# Patient Record
Sex: Male | Born: 1975
Health system: Southern US, Community
[De-identification: ages and names within clinical notes are randomized; demographics above are authoritative.]

## PROBLEM LIST (undated history)

## (undated) DIAGNOSIS — K219 Gastro-esophageal reflux disease without esophagitis: Secondary | ICD-10-CM

## (undated) DIAGNOSIS — E119 Type 2 diabetes mellitus without complications: Secondary | ICD-10-CM

## (undated) DIAGNOSIS — J302 Other seasonal allergic rhinitis: Secondary | ICD-10-CM

## (undated) HISTORY — DX: Other seasonal allergic rhinitis: J30.2

## (undated) HISTORY — DX: Gastro-esophageal reflux disease without esophagitis: K21.9

## (undated) HISTORY — PX: VASECTOMY: SHX75

---

## 2003-12-09 ENCOUNTER — Emergency Department (HOSPITAL_COMMUNITY): Admission: EM | Admit: 2003-12-09 | Discharge: 2003-12-10 | Payer: Self-pay | Admitting: Emergency Medicine

## 2009-07-15 ENCOUNTER — Telehealth: Payer: Self-pay | Admitting: Internal Medicine

## 2009-07-25 ENCOUNTER — Ambulatory Visit: Payer: Self-pay | Admitting: Internal Medicine

## 2009-07-25 DIAGNOSIS — F17200 Nicotine dependence, unspecified, uncomplicated: Secondary | ICD-10-CM | POA: Insufficient documentation

## 2009-08-01 ENCOUNTER — Ambulatory Visit: Payer: Self-pay | Admitting: Internal Medicine

## 2009-08-01 LAB — CONVERTED CEMR LAB
ALT: 25 units/L (ref 0–53)
BUN: 16 mg/dL (ref 6–23)
Basophils Relative: 0.4 % (ref 0.0–3.0)
CO2: 31 meq/L (ref 19–32)
Chloride: 105 meq/L (ref 96–112)
Cholesterol: 181 mg/dL (ref 0–200)
Eosinophils Absolute: 0.1 10*3/uL (ref 0.0–0.7)
Eosinophils Relative: 2.2 % (ref 0.0–5.0)
HCT: 45.1 % (ref 39.0–52.0)
Lymphs Abs: 2.7 10*3/uL (ref 0.7–4.0)
MCHC: 33.2 g/dL (ref 30.0–36.0)
MCV: 92.7 fL (ref 78.0–100.0)
Monocytes Absolute: 0.5 10*3/uL (ref 0.1–1.0)
Platelets: 232 10*3/uL (ref 150.0–400.0)
Potassium: 5 meq/L (ref 3.5–5.1)
Specific Gravity, Urine: 1.02 (ref 1.000–1.030)
TSH: 1.24 microintl units/mL (ref 0.35–5.50)
Total Bilirubin: 1 mg/dL (ref 0.3–1.2)
Total Protein, Urine: NEGATIVE mg/dL
Total Protein: 7.3 g/dL (ref 6.0–8.3)
Urine Glucose: NEGATIVE mg/dL
WBC: 6.6 10*3/uL (ref 4.5–10.5)

## 2009-08-05 ENCOUNTER — Telehealth: Payer: Self-pay | Admitting: Internal Medicine

## 2009-10-15 ENCOUNTER — Ambulatory Visit: Payer: Self-pay | Admitting: Internal Medicine

## 2009-10-15 DIAGNOSIS — J3089 Other allergic rhinitis: Secondary | ICD-10-CM | POA: Insufficient documentation

## 2010-03-31 ENCOUNTER — Ambulatory Visit: Payer: Self-pay | Admitting: Internal Medicine

## 2010-03-31 DIAGNOSIS — D485 Neoplasm of uncertain behavior of skin: Secondary | ICD-10-CM | POA: Insufficient documentation

## 2010-05-28 ENCOUNTER — Ambulatory Visit: Payer: Self-pay | Admitting: Internal Medicine

## 2010-07-28 NOTE — Assessment & Plan Note (Signed)
Summary: congested/plotnikov/cd   Vital Signs:  Patient profile:   35 year old male Height:      69 inches Weight:      180 pounds BMI:     26.68 O2 Sat:      97 % on Room air Temp:     97.9 degrees F oral Pulse rate:   87 / minute Pulse rhythm:   regular Resp:     16 per minute BP sitting:   104 / 62  (left arm) Cuff size:   large  Vitals Entered By: Rock Nephew CMA (May 28, 2010 1:21 PM)  Nutrition Counseling: Patient's BMI is greater than 25 and therefore counseled on weight management options.  O2 Flow:  Room air CC: pt c/o cugh, congestion, sore throat, sinus drainage, URI symptoms Is Patient Diabetic? No Pain Assessment Patient in pain? no       Does patient need assistance? Functional Status Self care Ambulation Normal   Primary Care Provider:  Georgina Quint Plotnikov MD  CC:  pt c/o cugh, congestion, sore throat, sinus drainage, and URI symptoms.  History of Present Illness:  URI Symptoms      This is a 35 year old man who presents with URI symptoms.  The symptoms began 3 days ago.  The severity is described as moderate.  The patient reports nasal congestion, purulent nasal discharge, sore throat, productive cough, and sick contacts, but denies earache.  The patient denies fever, stiff neck, dyspnea, wheezing, rash, vomiting, diarrhea, use of an antipyretic, and response to antipyretic.  The patient also reports sneezing and seasonal symptoms.  The patient denies headache, muscle aches, and severe fatigue.  Risk factors for Strep sinusitis include unilateral facial pain, unilateral nasal discharge, poor response to decongestant, and double sickening.    Preventive Screening-Counseling & Management  Alcohol-Tobacco     Alcohol drinks/day: 0     Alcohol Counseling: not indicated; patient does not drink     Smoking Status: current     Smoking Cessation Counseling: yes     Packs/Day: 0.5     Tobacco Counseling: to quit use of tobacco  products  Hep-HIV-STD-Contraception     Hepatitis Risk: no risk noted     HIV Risk: no risk noted     STD Risk: no risk noted      Sexual History:  currently monogamous.        Drug Use:  never.        Blood Transfusions:  no.    Clinical Review Panels:  Lipid Management   Cholesterol:  181 (08/01/2009)   LDL (bad choesterol):  112 (08/01/2009)   HDL (good cholesterol):  44.10 (08/01/2009)  Diabetes Management   Creatinine:  0.9 (08/01/2009)  CBC   WBC:  6.6 (08/01/2009)   RBC:  4.86 (08/01/2009)   Hgb:  15.0 (08/01/2009)   Hct:  45.1 (08/01/2009)   Platelets:  232.0 (08/01/2009)   MCV  92.7 (08/01/2009)   MCHC  33.2 (08/01/2009)   RDW  12.1 (08/01/2009)   PMN:  48.9 (08/01/2009)   Lymphs:  40.7 (08/01/2009)   Monos:  7.8 (08/01/2009)   Eosinophils:  2.2 (08/01/2009)   Basophil:  0.4 (08/01/2009)  Complete Metabolic Panel   Glucose:  100 (08/01/2009)   Sodium:  141 (08/01/2009)   Potassium:  5.0 (08/01/2009)   Chloride:  105 (08/01/2009)   CO2:  31 (08/01/2009)   BUN:  16 (08/01/2009)   Creatinine:  0.9 (08/01/2009)   Albumin:  4.3 (  08/01/2009)   Total Protein:  7.3 (08/01/2009)   Calcium:  9.9 (08/01/2009)   Total Bili:  1.0 (08/01/2009)   Alk Phos:  57 (08/01/2009)   SGPT (ALT):  25 (08/01/2009)   SGOT (AST):  19 (08/01/2009)   Medications Prior to Update: 1)  Vitamin D 1000 Unit Tabs (Cholecalciferol) .Marland Kitchen.. 1 By Mouth Qd 2)  Chantix Starting Month Pak 0.5 Mg X 11 & 1 Mg X 42 Tabs (Varenicline Tartrate) .... As Dirrected 3)  Chantix Continuing Month Pak 1 Mg Tabs (Varenicline Tartrate) .... As Dirrected 4)  Veramyst 27.5 Mcg/spray Susp (Fluticasone Furoate) .... 2 Puffs Each Nostril Once Daily 5)  Allegra-D 24 Hour 180-240 Mg Xr24h-Tab (Fexofenadine-Pseudoephedrine) .... One By Mouth Once Daily For Allergies  Current Medications (verified): 1)  Vitamin D 1000 Unit Tabs (Cholecalciferol) .Marland Kitchen.. 1 By Mouth Qd 2)  Chantix Starting Month Pak 0.5 Mg X 11 & 1  Mg X 42 Tabs (Varenicline Tartrate) .... As Dirrected 3)  Chantix Continuing Month Pak 1 Mg Tabs (Varenicline Tartrate) .... As Dirrected 4)  Veramyst 27.5 Mcg/spray Susp (Fluticasone Furoate) .... 2 Puffs Each Nostril Once Daily 5)  Allegra-D 24 Hour 180-240 Mg Xr24h-Tab (Fexofenadine-Pseudoephedrine) .... One By Mouth Once Daily For Allergies 6)  Avelox 400 Mg Tabs (Moxifloxacin Hcl) .... One By Mouth Once Daily For 7 Days 7)  Mytussin Ac 100-10 Mg/57ml Syrp (Guaifenesin-Codeine) .... 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 Family History Diabetes 1st degree relative mother B died of CHF, gout F CAD stent 72  Social History: Reviewed history from 07/25/2009 and no changes required. 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    Physical Exam  General:  alert, well-developed, well-nourished, well-hydrated, appropriate dress, normal appearance, healthy-appearing, and cooperative to examination.   Head:  normocephalic, atraumatic, no abnormalities observed, and no  abnormalities palpated.   Ears:  R ear normal and L ear normal.   Nose:  no external deformity, no airflow obstruction, no intranasal foreign body, no nasal polyps, no nasal mucosal lesions, no mucosal friability, no active bleeding or clots, no septum abnormalities, nasal dischargemucosal pallor, mucosal erythema, mucosal edema, L maxillary sinus tenderness, and R maxillary sinus tenderness.   Mouth:  good dentition, no exudates, no posterior lymphoid hypertrophy, no postnasal drip, no pharyngeal crowing, no lesions, no aphthous ulcers, no erosions, no tongue abnormalities, no leukoplakia, and pharyngeal erythema.   Neck:  supple, full ROM, no masses, no thyromegaly, no thyroid nodules or tenderness, normal carotid upstroke, no cervical lymphadenopathy, and no neck tenderness.   Lungs:  normal respiratory effort, no intercostal retractions, no accessory muscle use, normal breath sounds, no dullness, no fremitus, no crackles, and no wheezes.   Heart:  normal rate, regular rhythm, no  murmur, no gallop, no rub, and no JVD.   Abdomen:  soft, non-tender, normal bowel sounds, no distention, no masses, no guarding, no rigidity, no rebound tenderness, no hepatomegaly, and no splenomegaly.   Msk:  No deformity or scoliosis noted of thoracic or lumbar spine.   Pulses:  R and L carotid,radial,femoral,dorsalis pedis and posterior tibial pulses are full and equal bilaterally Extremities:  No clubbing, cyanosis, edema, or deformity noted with normal full range of motion of all joints.   Neurologic:  No cranial nerve deficits noted. Station and gait are normal. Plantar reflexes are down-going bilaterally. DTRs are symmetrical throughout. Sensory, motor and coordinative functions appear intact. Skin:  turgor normal, color normal, no rashes, no suspicious lesions, no ecchymoses, no petechiae, no purpura, no ulcerations, and no edema.   Cervical Nodes:  no anterior cervical adenopathy and no posterior cervical  adenopathy.   Axillary Nodes:  no R axillary adenopathy and no L axillary adenopathy.   Psych:  Cognition and judgment appear intact. Alert and cooperative with normal attention span and concentration. No apparent delusions, illusions, hallucinations   Impression & Recommendations:  Problem # 1:  BRONCHITIS-ACUTE (ICD-466.0)  His updated medication list for this problem includes:    Allegra-d 24 Hour 180-240 Mg Xr24h-tab (Fexofenadine-pseudoephedrine) ..... One by mouth once daily for allergies    Avelox 400 Mg Tabs (Moxifloxacin hcl) ..... One by mouth once daily for 7 days    Mytussin Ac 100-10 Mg/22ml Syrp (Guaifenesin-codeine) .Marland Kitchen... 5-10 ml by mouth qid as needed for cough  Take antibiotics and other medications as directed. Encouraged to push clear liquids, get enough rest, and take acetaminophen as needed. To be seen in 5-7 days if no improvement, sooner if worse.  Problem # 2:  ALLERGIC RHINITIS DUE TO OTHER ALLERGEN (ICD-477.8) Assessment: Unchanged  Complete Medication List: 1)  Vitamin D 1000 Unit Tabs (Cholecalciferol) .Marland Kitchen.. 1 by mouth qd 2)  Chantix Starting Month Pak 0.5 Mg X 11 & 1 Mg X 42 Tabs (Varenicline tartrate) .... As dirrected 3)  Chantix Continuing Month Pak 1 Mg Tabs (Varenicline tartrate) .... As dirrected 4)  Veramyst 27.5 Mcg/spray Susp (Fluticasone furoate) .... 2 puffs each nostril once daily 5)  Allegra-d 24 Hour 180-240 Mg Xr24h-tab (Fexofenadine-pseudoephedrine) .... One by mouth once daily for allergies 6)  Avelox 400 Mg Tabs (Moxifloxacin hcl) .... One by mouth once daily for 7 days 7)  Mytussin Ac 100-10 Mg/90ml Syrp (Guaifenesin-codeine) .... 5-10 ml by mouth qid as needed for cough  Patient Instructions: 1)  Please schedule a follow-up appointment in 1 month. 2)  Take your antibiotic as prescribed until ALL of it is gone, but stop if you develop a rash or swelling and contact our office as soon as possible. 3)  Acute bronchitis symptoms for less than  10 days are not helped by antibiotics. take over the counter cough medications. call if no improvment in  5-7 days, sooner if increasing cough, fever, or new symptoms( shortness of breath, chest pain). Prescriptions: MYTUSSIN AC 100-10 MG/5ML SYRP (GUAIFENESIN-CODEINE) 5-10 ml by mouth QID as needed for cough  #8 ounces x 1   Entered and Authorized by:   Etta Grandchild MD   Signed by:   Etta Grandchild MD on 05/28/2010   Method used:   Print then Give to Patient   RxID:   [id number] AVELOX 400 MG TABS (MOXIFLOXACIN HCL) one by mouth once daily for 7 days  #7 x 0   Entered and Authorized  by:   Etta Grandchild MD   Signed by:   Etta Grandchild MD on 05/28/2010   Method used:   Print then Give to Patient   RxID:   [id number]    Orders Added: 1)  Est. Patient Level IV [75643]

## 2010-07-28 NOTE — Progress Notes (Signed)
Summary: RESULTS  Phone Note Call from Patient Call back at Home Phone [phone]   Caller: WIFE JENNIFER Summary of Call: PLEASE CALL PT OR WIFE WITH LAB RESULTS.  JENNIFER'S EXT AT CARDIOLOGY IS [phone].  PT'S CELL IS [phone].  HOME IS [phone]. Initial call taken by: Hilarie Fredrickson,  August 05, 2009 9:21 AM  Follow-up for Phone Call        Sorry. I thought we mailed him labs... All labs were good! Please, mail a copy of all test results from this date  to the patient.   Follow-up by: Tresa Garter MD,  August 05, 2009 5:49 PM  Additional Follow-up for Phone Call Additional follow up Details #1::        Victorino Dike notified and copy mailed. Additional Follow-up by: Lucious Groves,  August 06, 2009 8:55 AM

## 2010-07-28 NOTE — Miscellaneous (Signed)
Summary: Special Procedure  Special Procedure   Imported By: [address] 04/02/2010 09:32:52  _____________________________________________________________________  External Attachment:    Type:   Image     Comment:   External Document

## 2010-07-28 NOTE — Letter (Signed)
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

## 2010-07-28 NOTE — Assessment & Plan Note (Signed)
Summary: ALLERGIES/ STUFFY HEAD/ AVP'S PT/NWS   Vital Signs:  Patient profile:   35 year old male Height:      69 inches Weight:      174 pounds BMI:     25.79 O2 Sat:      98 % on Room air Temp:     97.8 degrees F oral Pulse rate:   90 / minute Pulse rhythm:   regular Resp:     16 per minute BP sitting:   100 / 62  (left arm) Cuff size:   large  Vitals Entered By: Rock Nephew CMA (October 15, 2009 1:23 PM)  Nutrition Counseling: Patient's BMI is greater than 25 and therefore counseled on weight management options.  O2 Flow:  Room air CC: sinus congestion  Is Patient Diabetic? No Pain Assessment Patient in pain? no        Primary Care Provider:  Georgina Quint Plotnikov MD  CC:  sinus congestion .  History of Present Illness: New to me he complains of 2 week hx. of nasal allergies.  Preventive Screening-Counseling & Management  Alcohol-Tobacco     Alcohol drinks/day: 0     Smoking Status: current     Smoking Cessation Counseling: yes     Packs/Day: 0.5  Hep-HIV-STD-Contraception     Hepatitis Risk: no risk noted     HIV Risk: no risk noted     STD Risk: no risk noted      Sexual History:  currently monogamous.        Drug Use:  never.        Blood Transfusions:  no.    Medications Prior to Update: 1)  Vitamin D 1000 Unit Tabs (Cholecalciferol) .Marland Kitchen.. 1 By Mouth Qd 2)  Chantix Starting Month Pak 0.5 Mg X 11 & 1 Mg X 42 Tabs (Varenicline Tartrate) .... As Dirrected 3)  Chantix Continuing Month Pak 1 Mg Tabs (Varenicline Tartrate) .... As Dirrected  Current Medications (verified): 1)  Vitamin D 1000 Unit Tabs (Cholecalciferol) .Marland Kitchen.. 1 By Mouth Qd 2)  Chantix Starting Month Pak 0.5 Mg X 11 & 1 Mg X 42 Tabs (Varenicline Tartrate) .... As Dirrected 3)  Chantix Continuing Month Pak 1 Mg Tabs (Varenicline Tartrate) .... As Dirrected 4)  Veramyst 27.5 Mcg/spray Susp (Fluticasone Furoate) .... 2 Puffs Each Nostril Once Daily 5)  Allegra-D 24 Hour 180-240 Mg Xr24h-Tab  (Fexofenadine-Pseudoephedrine) .... One By Mouth Once Daily For Allergies  Allergies (verified): No Known Drug Allergies  Past History:  Past Medical History: Reviewed history from 07/25/2009 and no changes required. Unremarkable  Past Surgical History: Reviewed history from 07/25/2009 and no changes required. Denies surgical history  Family History: Reviewed history from 07/25/2009 and no changes required. Family History Diabetes 1st degree relative mother B died of CHF, gout F CAD stent 28  Social History: Reviewed history from 07/25/2009 and no changes required. Occupation: Advertising account planner Married Current Smoker Regular exercise-no Hepatitis Risk:  no risk noted HIV Risk:  no risk noted STD Risk:  no risk noted Sexual History:  currently monogamous Drug Use:  never Blood Transfusions:  no  Review of Systems  The patient denies anorexia, fever, chest pain, prolonged cough, abdominal pain, suspicious skin lesions, enlarged lymph nodes, and angioedema.   General:  Denies chills, fatigue, fever, malaise, and sweats. ENT:  Complains of nasal congestion and postnasal drainage; denies decreased hearing, ear discharge, earache, hoarseness, nosebleeds, ringing in ears, sinus pressure, and sore throat.  Physical Exam  General:  alert, well-developed, well-nourished, well-hydrated, appropriate dress, normal appearance, healthy-appearing, and cooperative to examination.   Head:  normocephalic, atraumatic, no abnormalities observed, and no abnormalities palpated.   Ears:  R ear normal and L ear normal.   Nose:  no external deformity, no airflow obstruction, no intranasal foreign body, no nasal polyps, no nasal mucosal lesions, no mucosal friability, no active bleeding or clots, no sinus percussion tenderness, no septum abnormalities, nasal dischargemucosal pallor, and mucosal edema.   Mouth:  Oral mucosa and oropharynx without lesions or exudates.  Teeth in good  repair. Neck:  supple, full ROM, no masses, no thyromegaly, no thyroid nodules or tenderness, no JVD, no carotid bruits, no cervical lymphadenopathy, and no neck tenderness.   Lungs:  normal respiratory effort, no intercostal retractions, no accessory muscle use, normal breath sounds, no dullness, no fremitus, no crackles, and no wheezes.   Heart:  normal rate, regular rhythm, no murmur, no gallop, no rub, and no JVD.   Abdomen:  soft, non-tender, normal bowel sounds, no distention, no masses, no guarding, no rigidity, no rebound tenderness, no hepatomegaly, and no splenomegaly.   Msk:  normal ROM, no joint tenderness, no joint swelling, no joint warmth, no redness over joints, no joint deformities, no joint instability, and no crepitation.   Pulses:  R and L carotid,radial,femoral,dorsalis pedis and posterior tibial pulses are full and equal bilaterally Extremities:  No clubbing, cyanosis, edema, or deformity noted with normal full range of motion of all joints.   Neurologic:  No cranial nerve deficits noted. Station and gait are normal. Plantar reflexes are down-going bilaterally. DTRs are symmetrical throughout. Sensory, motor and coordinative functions appear intact. Skin:  turgor normal, color normal, no rashes, no suspicious lesions, no ecchymoses, no petechiae, no purpura, no ulcerations, and no edema.   Cervical Nodes:  no anterior cervical adenopathy and no posterior cervical adenopathy.   Axillary Nodes:  no R axillary adenopathy and no L axillary adenopathy.   Psych:  Cognition and judgment appear intact. Alert and cooperative with normal attention span and concentration. No apparent delusions, illusions, hallucinations   Impression & Recommendations:  Problem # 1:  ALLERGIC RHINITIS DUE TO OTHER ALLERGEN (ICD-477.8) Assessment New  Orders: Admin of Therapeutic Inj  intramuscular or subcutaneous (40981) Depo- Medrol 80mg  (J1040)  Complete Medication List: 1)  Vitamin D 1000 Unit  Tabs (Cholecalciferol) .Marland Kitchen.. 1 by mouth qd 2)  Chantix Starting Month Pak 0.5 Mg X 11 & 1 Mg X 42 Tabs (Varenicline tartrate) .... As dirrected 3)  Chantix Continuing Month Pak 1 Mg Tabs (Varenicline tartrate) .... As dirrected 4)  Veramyst 27.5 Mcg/spray Susp (Fluticasone furoate) .... 2 puffs each nostril once daily 5)  Allegra-d 24 Hour 180-240 Mg Xr24h-tab (Fexofenadine-pseudoephedrine) .... One by mouth once daily for allergies  Other Orders: Depo- Medrol 40mg  (J1030)  Patient Instructions: 1)  Please schedule a follow-up appointment in 1 month. Prescriptions: ALLEGRA-D 24 HOUR 180-240 MG XR24H-TAB (FEXOFENADINE-PSEUDOEPHEDRINE) One by mouth once daily for allergies  #30 x 11   Entered and Authorized by:   Etta Grandchild MD   Signed by:   Etta Grandchild MD on 10/15/2009   Method used:   Print then Give to Patient   RxID:   [id number] VERAMYST 27.5 MCG/SPRAY SUSP (FLUTICASONE FUROATE) 2 puffs each nostril once daily  #6 inhs x 0   Entered and Authorized by:   Etta Grandchild MD   Signed by:   Etta Grandchild MD on 10/15/2009  Method used:   Samples Given   RxID:   [id number]    Medication Administration  Injection # 1:    Medication: Depo- Medrol 40mg     Diagnosis: TOBACCO USER (ICD-305.1)    Route: IM    Site: R deltoid    Exp Date: 02/2012    Lot #: Franchot [place]    Mfr: pfizer    Patient tolerated injection without complications    Given by: Rock Nephew CMA (October 15, 2009 3:17 PM)  Injection # 2:    Medication: Depo- Medrol 80mg     Diagnosis: ALLERGIC RHINITIS DUE TO OTHER ALLERGEN (ICD-477.8)    Route: IM    Site: R deltoid    Exp Date: 02/2012    Lot #: Franchot Heidelberg    Mfr: pfizer    Patient tolerated injection without complications    Given by: Rock Nephew CMA (October 15, 2009 3:17 PM)  Orders Added: 1)  Admin of Therapeutic Inj  intramuscular or subcutaneous [96372] 2)  Depo- Medrol 80mg  [J1040] 3)  Depo- Medrol 40mg  [J1030] 4)  Est. Patient Level  III [14782]

## 2010-07-28 NOTE — Assessment & Plan Note (Signed)
Summary: NEW PT/ / LABS AFTER IF NEEDED/[employer] /NWS  #   Vital Signs:  Patient profile:   35 year old male Height:      69 inches Weight:      174 pounds BMI:     25.79 Temp:     98.1 degrees F oral Pulse rate:   98 / minute BP sitting:   100 / 72  (left arm)  Vitals Entered By: Tora Perches (July 25, 2009 2:31 PM) CC: new pt to est. Is Patient Diabetic? No   CC:  new pt to est..  History of Present Illness: The patient presents for a wellness examination   Preventive Screening-Counseling & Management  Alcohol-Tobacco     Smoking Status: current     Smoking Cessation Counseling: yes     Packs/Day: 0.5  Caffeine-Diet-Exercise     Does Patient Exercise: no  Current Medications (verified): 1)  None  Allergies (verified): No Known Drug Allergies  Past History:  Past Medical History: Unremarkable  Past Surgical History: Denies surgical history  Family History: Family History Diabetes 1st degree relative mother B died of CHF, gout F CAD stent 22  Social History: Occupation: [employer] account manager Married Current Smoker Regular exercise-no Smoking Status:  current Packs/Day:  0.5 Does Patient Exercise:  no  Review of Systems  The patient denies anorexia, fever, weight loss, weight gain, vision loss, decreased hearing, hoarseness, chest pain, syncope, dyspnea on exertion, peripheral edema, prolonged cough, headaches, hemoptysis, abdominal pain, melena, hematochezia, severe indigestion/heartburn, hematuria, incontinence, genital sores, muscle weakness, suspicious skin lesions, transient blindness, difficulty walking, depression, unusual weight change, abnormal bleeding, enlarged lymph nodes, angioedema, and testicular masses.    Physical Exam  General:  Well-developed,well-nourished,in no acute distress; alert,appropriate and cooperative throughout examination Head:  Normocephalic and atraumatic without obvious abnormalities. No apparent alopecia or  balding. Eyes:  No corneal or conjunctival inflammation noted. EOMI. Perrla. Funduscopic exam benign, without hemorrhages, exudates or papilledema. Vision grossly normal. Ears:  External ear exam shows no significant lesions or deformities.  Otoscopic examination reveals clear canals, tympanic membranes are intact bilaterally without bulging, retraction, inflammation or discharge. Hearing is grossly normal bilaterally. Nose:  External nasal examination shows no deformity or inflammation. Nasal mucosa are pink and moist without lesions or exudates. Mouth:  Oral mucosa and oropharynx without lesions or exudates.  Teeth in good repair. Neck:  No deformities, masses, or tenderness noted. Lungs:  Normal respiratory effort, chest expands symmetrically. Lungs are clear to auscultation, no crackles or wheezes. Heart:  Normal rate and regular rhythm. S1 and S2 normal without gallop, murmur, click, rub or other extra sounds. Abdomen:  Bowel sounds positive,abdomen soft and non-tender without masses, organomegaly or hernias noted. Genitalia:  self exam - nl Msk:  No deformity or scoliosis noted of thoracic or lumbar spine.   Extremities:  No clubbing, cyanosis, edema, or deformity noted with normal full range of motion of all joints.   Neurologic:  No cranial nerve deficits noted. Station and gait are normal. Plantar reflexes are down-going bilaterally. DTRs are symmetrical throughout. Sensory, motor and coordinative functions appear intact. Skin:  Intact without suspicious lesions or rashes Cervical Nodes:  No lymphadenopathy noted Inguinal Nodes:  No significant adenopathy Psych:  Cognition and judgment appear intact. Alert and cooperative with normal attention span and concentration. No apparent delusions, illusions, hallucinations   Impression & Recommendations:  Problem # 1:  PHYSICAL EXAMINATION (ICD-V70.0) Assessment New Health and age related issues were discussed. Available screening tests  and  vaccinations were discussed as well. Healthy life style including good diet and execise was discussed.  Labs ordered  Problem # 2:  TOBACCO USER (ICD-305.1) Assessment: Comment Only  His updated medication list for this problem includes:    Chantix Starting Month Pak 0.5 Mg X 11 & 1 Mg X 42 Tabs (Varenicline tartrate) .Marland Kitchen... As dirrected    Chantix Continuing Month Pak 1 Mg Tabs (Varenicline tartrate) .Marland Kitchen... As dirrected  Complete Medication List: 1)  Vitamin D 1000 Unit Tabs (Cholecalciferol) .Marland Kitchen.. 1 by mouth qd 2)  Chantix Starting Month Pak 0.5 Mg X 11 & 1 Mg X 42 Tabs (Varenicline tartrate) .... As dirrected 3)  Chantix Continuing Month Pak 1 Mg Tabs (Varenicline tartrate) .... As dirrected  Patient Instructions: 1)  Please schedule a follow-up appointment in 1 year well w/labs. 2)  BMP prior to visit, ICD-9: 3)  Hepatic Panel prior to visit, ICD-9: 4)  Lipid Panel prior to visit, ICD-9: 5)  TSH prior to visit, ICD-9: 6)  CBC w/ Diff prior to visit, ICD-9: v70.0 7)  Urine-dip prior to visit, ICD-9: 8)  Tobacco is very bad for your health and your loved ones! You Should stop smoking!. Prescriptions: CHANTIX CONTINUING MONTH PAK 1 MG TABS (VARENICLINE TARTRATE) as dirrected  #1 x 5   Entered and Authorized by:   Tresa Garter MD   Signed by:   Tresa Garter MD on 07/25/2009   Method used:   Print then Give to Patient   RxID:   [id number] CHANTIX STARTING MONTH PAK 0.5 MG X 11 & 1 MG X 42 TABS (VARENICLINE TARTRATE) as dirrected  #1 x 0   Entered and Authorized by:   Tresa Garter MD   Signed by:   Tresa Garter MD on 07/25/2009   Method used:   Print then Give to Patient   RxID:   [id number]

## 2010-07-28 NOTE — Assessment & Plan Note (Signed)
Summary: bump on calf/#/cd   Vital Signs:  Patient profile:   35 year old male Height:      69 inches Weight:      181 pounds BMI:     26.83 Temp:     98.3 degrees F oral Pulse rate:   88 / minute Pulse rhythm:   regular BP sitting:   110 / 80  (left arm) Cuff size:   regular  Vitals Entered By: Lanier Prude, Beverly Gust) (March 31, 2010 2:53 PM)  Procedure Note  Cyst Removal: Consent signed: yes  Biopsy: Biopsy Type: Skin The patient complains of irritation and changing lesion. Indication: changing lesion Consent signed: yes  Procedure # 1: shave biopsy    Size (in cm): 1.1 x 1.1    Region: lateral    Location: L distal shin    Comment: Risks including but not limited by incomplete procedure, bleeding, infection, recurrence were discussed with the patient. Consent form was signed. Tolerated well. Complicatons - none.     Instrument used: dermablade    Anesthesia: 1.5 ml 1% lidocaine w/epinephrine  Cleaned and prepped with: alcohol and betadine Wound dressing: neosporin and bandaid  CC: skin lesion on Lt leg is itching and changing in size Is Patient Diabetic? No   Primary Care Provider:  Tresa Garter MD  CC:  skin lesion on Lt leg is itching and changing in size.  History of Present Illness: L leg lesion x 12 -18 months, itchy  Preventive Screening-Counseling & Management  Alcohol-Tobacco     Alcohol drinks/day: 0     Smoking Status: current     Smoking Cessation Counseling: yes     Packs/Day: 0.5  Current Medications (verified): 1)  Vitamin D 1000 Unit Tabs (Cholecalciferol) .Marland Kitchen.. 1 By Mouth Qd 2)  Chantix Starting Month Pak 0.5 Mg X 11 & 1 Mg X 42 Tabs (Varenicline Tartrate) .... As Dirrected 3)  Chantix Continuing Month Pak 1 Mg Tabs (Varenicline Tartrate) .... As Dirrected 4)  Veramyst 27.5 Mcg/spray Susp (Fluticasone Furoate) .... 2 Puffs Each Nostril Once Daily 5)  Allegra-D 24 Hour 180-240 Mg Xr24h-Tab (Fexofenadine-Pseudoephedrine) ....  One By Mouth Once Daily For Allergies  Allergies (verified): No Known Drug Allergies   Impression & Recommendations:  Problem # 1:  NEOPLASM OF UNCERTAIN BEHAVIOR OF SKIN (ICD-238.2) L shin Assessment New  Options discussed. Best options would be biopsy removal - he agreed. Specimen sent to path lab  Orders: Shave Skin Lesion 1.1-2.0 cm/trunk/arm/leg (62130)  Complete Medication List: 1)  Vitamin D 1000 Unit Tabs (Cholecalciferol) .Marland Kitchen.. 1 by mouth qd 2)  Chantix Starting Month Pak 0.5 Mg X 11 & 1 Mg X 42 Tabs (Varenicline tartrate) .... As dirrected 3)  Chantix Continuing Month Pak 1 Mg Tabs (Varenicline tartrate) .... As dirrected 4)  Veramyst 27.5 Mcg/spray Susp (Fluticasone furoate) .... 2 puffs each nostril once daily 5)  Allegra-d 24 Hour 180-240 Mg Xr24h-tab (Fexofenadine-pseudoephedrine) .... One by mouth once daily for allergies  Patient Instructions: 1)  Call if problems

## 2010-07-28 NOTE — Progress Notes (Signed)
Summary: NEW PT REQUEST  ---- Converted from flag ---- ---- 07/15/2009 12:36 PM, Georgina Quint Eliam Snapp MD wrote: Sure. 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  He has [employer]. ------------------------------  Phone Note Call from Patient Call back at Home Phone [phone]   Caller: [facility] Initial call taken by: Hilarie Fredrickson,  July 15, 2009 2:36 PM  Follow-up for Phone Call        JENNIFER IS AWARE.  PT HAS AN APPT ON JAN. 28, 2011. Follow-up by: Hilarie Fredrickson,  July 15, 2009 2:36 PM

## 2010-10-26 ENCOUNTER — Telehealth: Payer: Self-pay | Admitting: Internal Medicine

## 2010-10-26 ENCOUNTER — Encounter: Payer: Self-pay | Admitting: Internal Medicine

## 2010-10-26 ENCOUNTER — Other Ambulatory Visit (INDEPENDENT_AMBULATORY_CARE_PROVIDER_SITE_OTHER): Payer: BC Managed Care – PPO

## 2010-10-26 ENCOUNTER — Ambulatory Visit (INDEPENDENT_AMBULATORY_CARE_PROVIDER_SITE_OTHER): Payer: BC Managed Care – PPO | Admitting: Internal Medicine

## 2010-10-26 VITALS — BP 120/76 | HR 73 | Temp 98.2°F | Ht 69.0 in

## 2010-10-26 DIAGNOSIS — K2990 Gastroduodenitis, unspecified, without bleeding: Secondary | ICD-10-CM

## 2010-10-26 DIAGNOSIS — K2970 Gastritis, unspecified, without bleeding: Secondary | ICD-10-CM

## 2010-10-26 LAB — CBC WITH DIFFERENTIAL/PLATELET
Basophils Relative: 0.3 % (ref 0.0–3.0)
Eosinophils Relative: 0.6 % (ref 0.0–5.0)
HCT: 46 % (ref 39.0–52.0)
Hemoglobin: 16.1 g/dL (ref 13.0–17.0)
Lymphs Abs: 3.2 10*3/uL (ref 0.7–4.0)
MCV: 91.7 fl (ref 78.0–100.0)
Monocytes Absolute: 1 10*3/uL (ref 0.1–1.0)
RBC: 5.02 Mil/uL (ref 4.22–5.81)
WBC: 13.3 10*3/uL — ABNORMAL HIGH (ref 4.5–10.5)

## 2010-10-26 LAB — HEPATIC FUNCTION PANEL
ALT: 26 U/L (ref 0–53)
Bilirubin, Direct: 0.2 mg/dL (ref 0.0–0.3)
Total Protein: 7.4 g/dL (ref 6.0–8.3)

## 2010-10-26 MED ORDER — PROMETHAZINE HCL 25 MG PO TABS: 25.0000 mg | ORAL_TABLET | Freq: Four times a day (QID) | ORAL | Status: AC | PRN

## 2010-10-26 MED ORDER — OMEPRAZOLE 20 MG PO CPDR: 20.0000 mg | DELAYED_RELEASE_CAPSULE | Freq: Every day | ORAL | Status: DC

## 2010-10-26 NOTE — Telephone Encounter (Signed)
Please call patient - normal or stable test results. No medication changes recommended - continue omeprazole, gas x and promethazine as needed, call if pain worse or unimproved with meds after 1-2 weeks. Thanks.

## 2010-10-26 NOTE — Progress Notes (Signed)
  Subjective:     Dean Glass is a 35 y.o. male who presents for evaluation of abdominal pain. Onset was 3 months ago. Symptoms have been rapidly worsening. The pain is described as aching, cramping and dull, and is 7/10 in intensity. Pain is located in the RUQ, LUQ and epigastric region without radiation.  Aggravating factors: activity, eating, fatty foods and NSAIDs.  Alleviating factors: fasting and regurgitation. Associated symptoms: anorexia and belching. The patient denies diarrhea, fever, hematochezia, hematuria and weight loss.  The patient's history has been marked as reviewed and updated as appropriate.  Review of Systems Constitutional: negative for fatigue, night sweats, sweats and weight loss Respiratory: negative for cough, dyspnea on exertion and pleurisy/chest pain Cardiovascular: negative for chest pain, palpitations and syncope Gastrointestinal: positive for dyspepsia, negative for change in bowel habits     Objective:    BP 120/76  Pulse 73  Temp(Src) 98.2 F (36.8 C) (Oral)  Ht 5\' 9"  (1.753 m)  SpO2 97% General appearance: alert, appears stated age and no distress Lungs: clear to auscultation bilaterally Heart: regular rate and rhythm, S1, S2 normal, no murmur, click, rub or gallop Abdomen: soft, non-tender; bowel sounds normal; no masses,  no organomegaly    Assessment:    Abdominal pain, likely secondary to gastritis .    Plan:    The diagnosis was discussed with the patient and evaluation and treatment plans outlined. See orders for lab and imaging studies. Initiate empiric trial of acid suppression; see orders. Follow up with PCP in 2 weeks.

## 2010-10-26 NOTE — Telephone Encounter (Signed)
Pt Notified with lab results & md recommendations.Marland KitchenMarland Kitchen4/30/12@1 :49pm/LMB

## 2010-10-26 NOTE — Patient Instructions (Signed)
It was good to see you today. Test(s) ordered today. Your results will be called to you after review (48-72hours after test completion). If any changes need to be made, you will be notified at that time. Use omeprazole for acid and promethazine for nasuea - also try Gas X (simethacone) as discussed (OTC) If abnomrla labs, will consider other testing If symptoms not improved with medications, follow up with Plotnikov in next 2 weeks, sooner if worse

## 2010-10-27 ENCOUNTER — Encounter: Payer: Self-pay | Admitting: Internal Medicine

## 2011-05-07 ENCOUNTER — Telehealth: Payer: Self-pay | Admitting: *Deleted

## 2011-05-07 DIAGNOSIS — Z0000 Encounter for general adult medical examination without abnormal findings: Secondary | ICD-10-CM

## 2011-05-07 NOTE — Telephone Encounter (Signed)
CPE in 06/2011. Labs entered.

## 2011-07-22 ENCOUNTER — Other Ambulatory Visit (INDEPENDENT_AMBULATORY_CARE_PROVIDER_SITE_OTHER): Payer: BC Managed Care – PPO

## 2011-07-22 DIAGNOSIS — Z0000 Encounter for general adult medical examination without abnormal findings: Secondary | ICD-10-CM

## 2011-07-22 LAB — URINALYSIS, ROUTINE W REFLEX MICROSCOPIC
Ketones, ur: NEGATIVE
Specific Gravity, Urine: >=1.03 (ref 1.000–1.030)
Urine Glucose: NEGATIVE
pH: 6 (ref 5.0–8.0)

## 2011-07-22 LAB — LIPID PANEL
LDL Cholesterol: 120 mg/dL — ABNORMAL HIGH (ref 0–99)
Total CHOL/HDL Ratio: 5

## 2011-07-22 LAB — CBC WITH DIFFERENTIAL/PLATELET
Basophils Relative: 0.4 % (ref 0.0–3.0)
Eosinophils Relative: 1.3 % (ref 0.0–5.0)
HCT: 46 % (ref 39.0–52.0)
Lymphs Abs: 3 10*3/uL (ref 0.7–4.0)
MCV: 91.5 fl (ref 78.0–100.0)
Monocytes Absolute: 0.6 10*3/uL (ref 0.1–1.0)
Neutro Abs: 6.9 10*3/uL (ref 1.4–7.7)
Platelets: 268 10*3/uL (ref 150.0–400.0)
RBC: 5.03 Mil/uL (ref 4.22–5.81)
WBC: 10.8 10*3/uL — ABNORMAL HIGH (ref 4.5–10.5)

## 2011-07-22 LAB — BASIC METABOLIC PANEL
Calcium: 9.4 mg/dL (ref 8.4–10.5)
GFR: 100.14 mL/min (ref >60.00)
Sodium: 139 mEq/L (ref 135–145)

## 2011-07-22 LAB — HEPATIC FUNCTION PANEL
Alkaline Phosphatase: 64 U/L (ref 39–117)
Bilirubin, Direct: 0.1 mg/dL (ref 0.0–0.3)
Total Bilirubin: 0.6 mg/dL (ref 0.3–1.2)

## 2011-07-23 ENCOUNTER — Encounter: Payer: BC Managed Care – PPO | Admitting: Internal Medicine

## 2011-07-29 ENCOUNTER — Ambulatory Visit (INDEPENDENT_AMBULATORY_CARE_PROVIDER_SITE_OTHER): Payer: BC Managed Care – PPO | Admitting: Internal Medicine

## 2011-07-29 ENCOUNTER — Encounter: Payer: Self-pay | Admitting: Internal Medicine

## 2011-07-29 VITALS — BP 120/70 | HR 92 | Temp 98.5°F | Resp 16 | Ht 69.0 in | Wt 184.0 lb

## 2011-07-29 DIAGNOSIS — K2970 Gastritis, unspecified, without bleeding: Secondary | ICD-10-CM

## 2011-07-29 DIAGNOSIS — K2990 Gastroduodenitis, unspecified, without bleeding: Secondary | ICD-10-CM

## 2011-07-29 MED ORDER — OMEPRAZOLE 20 MG PO CPDR: 20.0000 mg | DELAYED_RELEASE_CAPSULE | Freq: Every day | ORAL | Status: DC

## 2011-07-29 MED ORDER — VITAMIN D 1000 UNITS PO TABS: 1000.0000 [IU] | ORAL_TABLET | Freq: Every day | ORAL | Status: DC

## 2011-07-29 NOTE — Progress Notes (Signed)
  Subjective:    Patient ID: Dean Glass, male    DOB: Sep 14, 1975, 36 y.o.   MRN: [id number]  HPI  The patient is here for a wellness exam. The patient has been doing well overall without major physical or psychological issues going on lately. C/o occ knee pains  Review of Systems  Constitutional: Negative for appetite change, fatigue and unexpected weight change.  HENT: Negative for nosebleeds, congestion, sore throat, sneezing, trouble swallowing and neck pain.   Eyes: Negative for itching and visual disturbance.  Respiratory: Negative for cough, chest tightness and stridor.   Cardiovascular: Negative for chest pain, palpitations and leg swelling.  Gastrointestinal: Negative for nausea, abdominal pain, diarrhea, blood in stool and abdominal distention.  Genitourinary: Negative for frequency and hematuria.  Musculoskeletal: Negative for back pain, joint swelling, arthralgias and gait problem.       Occ knee pains  Skin: Negative for rash.  Neurological: Negative for dizziness, tremors, speech difficulty and weakness.  Psychiatric/Behavioral: Negative for suicidal ideas, behavioral problems, confusion, sleep disturbance, dysphoric mood, decreased concentration and agitation. The patient is not nervous/anxious.        Objective:   Physical Exam  Constitutional: He is oriented to person, place, and time. He appears well-developed.  HENT:  Mouth/Throat: Oropharynx is clear and moist.  Eyes: Conjunctivae are normal. Pupils are equal, round, and reactive to light.  Neck: Normal range of motion. No JVD present. No thyromegaly present.  Cardiovascular: Normal rate, regular rhythm, normal heart sounds and intact distal pulses.  Exam reveals no gallop and no friction rub.   No murmur heard. Pulmonary/Chest: Effort normal and breath sounds normal. No respiratory distress. He has no wheezes. He has no rales. He exhibits no tenderness.  Abdominal: Soft. Bowel sounds are normal. He  exhibits no distension and no mass. There is no tenderness. There is no rebound and no guarding.  Genitourinary:       Pt refused  Musculoskeletal: Normal range of motion. He exhibits no edema and no tenderness.  Lymphadenopathy:    He has no cervical adenopathy.  Neurological: He is alert and oriented to person, place, and time. He has normal reflexes. No cranial nerve deficit. He exhibits normal muscle tone. Coordination normal.  Skin: Skin is warm and dry. No rash noted.  Psychiatric: He has a normal mood and affect. His behavior is normal. Judgment and thought content normal.     Lab Results  Component Value Date   WBC 10.8* 07/22/2011   HGB 15.8 07/22/2011   HCT 46.0 07/22/2011   PLT 268.0 07/22/2011   GLUCOSE 108* 07/22/2011   CHOL 190 07/22/2011   TRIG 154.0* 07/22/2011   HDL 39.00* 07/22/2011   LDLCALC 120* 07/22/2011   ALT 36 07/22/2011   AST 23 07/22/2011   NA 139 07/22/2011   K 4.0 07/22/2011   CL 104 07/22/2011   CREATININE 0.9 07/22/2011   BUN 9 07/22/2011   CO2 29 07/22/2011   TSH 1.08 07/22/2011        Assessment & Plan:

## 2012-09-26 ENCOUNTER — Ambulatory Visit (INDEPENDENT_AMBULATORY_CARE_PROVIDER_SITE_OTHER): Payer: BC Managed Care – PPO | Admitting: Internal Medicine

## 2012-09-26 ENCOUNTER — Encounter: Payer: Self-pay | Admitting: Internal Medicine

## 2012-09-26 VITALS — BP 112/72 | HR 76 | Temp 97.0°F | Resp 16 | Wt 183.0 lb

## 2012-09-26 DIAGNOSIS — J3089 Other allergic rhinitis: Secondary | ICD-10-CM

## 2012-09-26 DIAGNOSIS — F17200 Nicotine dependence, unspecified, uncomplicated: Secondary | ICD-10-CM

## 2012-09-26 DIAGNOSIS — J019 Acute sinusitis, unspecified: Secondary | ICD-10-CM

## 2012-09-26 MED ORDER — LORATADINE 10 MG PO TABS: 10.0000 mg | ORAL_TABLET | Freq: Every day | ORAL | Status: DC | PRN

## 2012-09-26 MED ORDER — FLUTICASONE PROPIONATE 50 MCG/ACT NA SUSP: 2.0000 | Freq: Every day | NASAL | Status: DC

## 2012-09-26 MED ORDER — CEFUROXIME AXETIL 500 MG PO TABS: 500.0000 mg | ORAL_TABLET | Freq: Two times a day (BID) | ORAL | Status: DC

## 2012-09-26 NOTE — Progress Notes (Signed)
Patient ID: Dean Glass, male   DOB: May 11, 1976, 37 y.o.   MRN: [id number]  Subjective:    Patient ID: Dean Glass, male    DOB: 09/01/1975, 37 y.o.   MRN: [id number]  Sinusitis This is a new problem. The current episode started 1 to 4 weeks ago. The problem has been waxing and waning since onset. There has been no fever. Associated symptoms include coughing and sinus pressure. Pertinent negatives include no congestion, neck pain, sneezing or sore throat. Past treatments include oral decongestants. The treatment provided no relief.  Cough Pertinent negatives include no chest pain, rash or sore throat.    The patient is here for a wellness exam. The patient has been doing well overall without major physical or psychological issues going on lately. C/o occ knee pains  Review of Systems  Constitutional: Negative for appetite change, fatigue and unexpected weight change.  HENT: Positive for sinus pressure. Negative for nosebleeds, congestion, sore throat, sneezing, trouble swallowing and neck pain.   Eyes: Negative for itching and visual disturbance.  Respiratory: Positive for cough. Negative for chest tightness and stridor.   Cardiovascular: Negative for chest pain, palpitations and leg swelling.  Gastrointestinal: Negative for nausea, abdominal pain, diarrhea, blood in stool and abdominal distention.  Genitourinary: Negative for frequency and hematuria.  Musculoskeletal: Negative for back pain, joint swelling, arthralgias and gait problem.       Occ knee pains  Skin: Negative for rash.  Neurological: Negative for dizziness, tremors, speech difficulty and weakness.  Psychiatric/Behavioral: Negative for suicidal ideas, behavioral problems, confusion, sleep disturbance, dysphoric mood, decreased concentration and agitation. The patient is not nervous/anxious.        Objective:   Physical Exam  Constitutional: He is oriented to person, place, and time. He appears well-developed.   HENT:  Mouth/Throat: Oropharynx is clear and moist.  Eyes: Conjunctivae are normal. Pupils are equal, round, and reactive to light.  Neck: Normal range of motion. No JVD present. No thyromegaly present.  Cardiovascular: Normal rate, regular rhythm, normal heart sounds and intact distal pulses.  Exam reveals no gallop and no friction rub.   No murmur heard. Pulmonary/Chest: Effort normal and breath sounds normal. No respiratory distress. He has no wheezes. He has no rales. He exhibits no tenderness.  Abdominal: Soft. Bowel sounds are normal. He exhibits no distension and no mass. There is no tenderness. There is no rebound and no guarding.  Genitourinary:  Pt refused  Musculoskeletal: Normal range of motion. He exhibits no edema and no tenderness.  Lymphadenopathy:    He has no cervical adenopathy.  Neurological: He is alert and oriented to person, place, and time. He has normal reflexes. No cranial nerve deficit. He exhibits normal muscle tone. Coordination normal.  Skin: Skin is warm and dry. No rash noted.  Psychiatric: He has a normal mood and affect. His behavior is normal. Judgment and thought content normal.     Lab Results  Component Value Date   WBC 10.8* 07/22/2011   HGB 15.8 07/22/2011   HCT 46.0 07/22/2011   PLT 268.0 07/22/2011   GLUCOSE 108* 07/22/2011   CHOL 190 07/22/2011   TRIG 154.0* 07/22/2011   HDL 39.00* 07/22/2011   LDLCALC 120* 07/22/2011   ALT 36 07/22/2011   AST 23 07/22/2011   NA 139 07/22/2011   K 4.0 07/22/2011   CL 104 07/22/2011   CREATININE 0.9 07/22/2011   BUN 9 07/22/2011   CO2 29 07/22/2011   TSH 1.08 07/22/2011  Assessment & Plan:

## 2012-09-26 NOTE — Assessment & Plan Note (Signed)
He will try e cigarets

## 2012-09-26 NOTE — Assessment & Plan Note (Signed)
Ceftin x10 d 

## 2012-09-26 NOTE — Assessment & Plan Note (Signed)
See Rx 

## 2013-03-23 ENCOUNTER — Ambulatory Visit (INDEPENDENT_AMBULATORY_CARE_PROVIDER_SITE_OTHER): Payer: BC Managed Care – PPO | Admitting: *Deleted

## 2013-03-23 DIAGNOSIS — Z23 Encounter for immunization: Secondary | ICD-10-CM

## 2013-05-10 ENCOUNTER — Ambulatory Visit: Payer: BC Managed Care – PPO

## 2013-06-06 ENCOUNTER — Ambulatory Visit: Payer: BC Managed Care – PPO

## 2013-06-15 ENCOUNTER — Ambulatory Visit: Payer: BC Managed Care – PPO

## 2013-07-03 ENCOUNTER — Ambulatory Visit (INDEPENDENT_AMBULATORY_CARE_PROVIDER_SITE_OTHER): Payer: BC Managed Care – PPO | Admitting: Internal Medicine

## 2013-07-03 ENCOUNTER — Encounter: Payer: Self-pay | Admitting: Internal Medicine

## 2013-07-03 VITALS — BP 130/98 | HR 88 | Temp 97.7°F | Resp 16 | Wt 178.0 lb

## 2013-07-03 DIAGNOSIS — J069 Acute upper respiratory infection, unspecified: Secondary | ICD-10-CM | POA: Insufficient documentation

## 2013-07-03 MED ORDER — PSEUDOEPHEDRINE HCL ER 120 MG PO TB12: 120.0000 mg | ORAL_TABLET | Freq: Two times a day (BID) | ORAL | Status: DC | PRN

## 2013-07-03 MED ORDER — PROMETHAZINE-CODEINE 6.25-10 MG/5ML PO SYRP: 5.0000 mL | ORAL_SOLUTION | ORAL | Status: DC | PRN

## 2013-07-03 MED ORDER — AZITHROMYCIN 250 MG PO TABS: ORAL_TABLET | ORAL | Status: DC

## 2013-07-03 NOTE — Assessment & Plan Note (Signed)
Zpac Sudafed Prom-cod syr

## 2013-07-03 NOTE — Progress Notes (Signed)
   Subjective:    Patient ID: Dean Glass, male    DOB: 03/03/1976, 38 y.o.   MRN: [id number]  Cough This is a new problem. The current episode started in the past 7 days. The problem has been gradually worsening. The problem occurs every few minutes. The cough is productive of sputum. Associated symptoms include chills, rhinorrhea and a sore throat. Pertinent negatives include no fever, headaches or shortness of breath. He has tried OTC cough suppressant for the symptoms. The treatment provided no relief. There is no history of asthma.      Review of Systems  Constitutional: Positive for chills. Negative for fever.  HENT: Positive for congestion, rhinorrhea and sore throat.   Respiratory: Positive for cough. Negative for shortness of breath.   Neurological: Negative for headaches.       Objective:   Physical Exam  Constitutional: He is oriented to person, place, and time. He appears well-developed.  NAD  HENT:  Mouth/Throat: Oropharynx is clear and moist. No oropharyngeal exudate.  eryth throat  Eyes: Conjunctivae are normal. Pupils are equal, round, and reactive to light.  Neck: Normal range of motion. No JVD present. No thyromegaly present.  Cardiovascular: Normal rate, regular rhythm, normal heart sounds and intact distal pulses.  Exam reveals no gallop and no friction rub.   No murmur heard. Pulmonary/Chest: Effort normal and breath sounds normal. No respiratory distress. He has no wheezes. He has no rales. He exhibits no tenderness.  Abdominal: Soft. Bowel sounds are normal. He exhibits no distension and no mass. There is no tenderness. There is no rebound and no guarding.  Musculoskeletal: Normal range of motion. He exhibits no edema and no tenderness.  Lymphadenopathy:    He has no cervical adenopathy.  Neurological: He is alert and oriented to person, place, and time. He has normal reflexes. No cranial nerve deficit. He exhibits normal muscle tone. He displays a  negative Romberg sign. Coordination and gait normal.  No meningeal signs  Skin: Skin is warm and dry. No rash noted.  Psychiatric: He has a normal mood and affect. His behavior is normal. Judgment and thought content normal.          Assessment & Plan:

## 2013-07-03 NOTE — Progress Notes (Signed)
Pre visit review using our clinic review tool, if applicable. No additional management support is needed unless otherwise documented below in the visit note. 

## 2013-07-03 NOTE — Addendum Note (Signed)
Addended by: Cassandria Anger on: 07/03/2013 05:14 PM   Modules accepted: Orders, Medications

## 2014-02-13 ENCOUNTER — Ambulatory Visit (INDEPENDENT_AMBULATORY_CARE_PROVIDER_SITE_OTHER): Payer: BC Managed Care – PPO | Admitting: Internal Medicine

## 2014-02-13 ENCOUNTER — Encounter: Payer: Self-pay | Admitting: Internal Medicine

## 2014-02-13 VITALS — BP 114/80 | HR 105 | Temp 98.3°F | Wt 185.1 lb

## 2014-02-13 DIAGNOSIS — N6019 Diffuse cystic mastopathy of unspecified breast: Secondary | ICD-10-CM

## 2014-02-13 DIAGNOSIS — N6012 Diffuse cystic mastopathy of left breast: Secondary | ICD-10-CM

## 2014-02-13 NOTE — Patient Instructions (Signed)
Fibrocystic breast changes can be aggravated by caffeine intake (coffee, tea, cola, or chocolate). Vitamin E 400 international units daily may help related symptoms. Wean the caffeine as possible as we discussed rather than going cold [place]. Use warm moist compresses to 3 times a day to the affected area. If the mass fails to resolve over the next 4-6 weeks; mammograms would be recommended.

## 2014-02-13 NOTE — Progress Notes (Signed)
   Subjective:    Patient ID: Dean Glass, male    DOB: 10-14-1975, 38 y.o.   MRN: [id number]  HPI  Patient seen today regarding concern about tenderness and lump on left breast.  First noticed around 02-06-14, has gotten smaller in last several days and is no longer tender.    He has had no recent fevers/chills.  2 recent wasp stings 8/14, but no residual symptoms.   No family history of breast cancer.   No nipple discharge.   Past Medical History  Diagnosis Date  . Seasonal allergies   . GERD (gastroesophageal reflux disease)     Past Surgical History  Procedure Laterality Date  . Vasectomy      Review of Systems  Constitutional: Negative for fever, chills, activity change, appetite change and unexpected weight change.  Cardiovascular: Negative for chest pain.  Gastrointestinal: Negative for nausea, vomiting, diarrhea and constipation.  Musculoskeletal: Negative for arthralgias and myalgias.  Skin: Negative for rash.       Objective:   Physical Exam  Constitutional: He is oriented to person, place, and time. He appears well-developed and well-nourished. No distress.  Neck: Normal range of motion. Neck supple. No thyromegaly present.  Cardiovascular: Normal rate, regular rhythm and normal heart sounds.   No murmur heard. Pulmonary/Chest: Effort normal and breath sounds normal.  2 mm mobile nodule at 12 o'clock position above left nipple.  Non-tender to palpation.   Abdominal: Soft. Bowel sounds are normal.  Musculoskeletal: Normal range of motion.  Lymphadenopathy:    He has no cervical adenopathy.  Neurological: He is alert and oriented to person, place, and time.  Skin: Skin is warm and dry. No rash noted. He is not diaphoretic.  Resolving bruise on left upper chest  Psychiatric: He has a normal mood and affect. His behavior is normal. Judgment and thought content normal.        Assessment & Plan:

## 2014-02-13 NOTE — Progress Notes (Signed)
   Subjective:    Patient ID: Dean Glass, male    DOB: [birth year], 38 y.o.   MRN: [id number]  HPI   He noted a tender area above the left nipple 02/06/14. There was no trigger or injury for this. It has gotten smaller over the last several days and is no longer tender but it does persist.  He denies any associated constitutional symptoms.  There is no discharge from the breast.  There's no family history of breast cancer  He does drink 4 servings of tea daily and 2-3 servings of coffee. He avoids colas and does not ingest significant amounts of chocolate.      Review of Systems  Specifically he denies any associated rash in this area.  He has no fever, chills, sweats, weight loss.       Objective:   Physical Exam  Positive or pertinent physical findings include: There is a 2 mm lesion above the left nipple which is mobile and not attached to the subcutaneous tissues. This does transilluminate. There is a faint bruise in the left pectoral area above this. There is also a healed laceration with bruising of the right upper extremity There are papular lesions over the posterior thorax related to recent wasp bites.   General appearance :adequately nourished; in no distress. Eyes: No conjunctival inflammation or scleral icterus is present. Thyroid normal. Heart:  Normal rate and regular rhythm. S1 and S2 normal without gallop, murmur, click, rub or other extra sounds   Lungs:Chest clear to auscultation; no wheezes, rhonchi,rales ,or rubs present.No increased work of breathing.  Abdomen: bowel sounds normal, soft and non-tender without masses, organomegaly or hernias noted.  No guarding or rebound.  Skin:Warm & dry.  Intact without suspicious lesions or rashes ; no jaundice or tenting Lymphatic: No lymphadenopathy is noted about the head, neck, axilla.             Assessment & Plan:  #1 fibrocystic breast lesion. See AVS

## 2014-02-13 NOTE — Progress Notes (Signed)
Pre visit review using our clinic review tool, if applicable. No additional management support is needed unless otherwise documented below in the visit note. 

## 2014-04-09 ENCOUNTER — Telehealth: Payer: Self-pay | Admitting: Internal Medicine

## 2014-04-09 ENCOUNTER — Ambulatory Visit: Payer: BC Managed Care – PPO | Admitting: Internal Medicine

## 2014-04-09 NOTE — Telephone Encounter (Signed)
error 

## 2015-05-01 ENCOUNTER — Other Ambulatory Visit (INDEPENDENT_AMBULATORY_CARE_PROVIDER_SITE_OTHER): Payer: BLUE CROSS/BLUE SHIELD

## 2015-05-01 ENCOUNTER — Ambulatory Visit (INDEPENDENT_AMBULATORY_CARE_PROVIDER_SITE_OTHER): Payer: BLUE CROSS/BLUE SHIELD | Admitting: Internal Medicine

## 2015-05-01 ENCOUNTER — Encounter: Payer: Self-pay | Admitting: Internal Medicine

## 2015-05-01 VITALS — BP 130/90 | HR 88 | Ht 67.0 in | Wt 199.0 lb

## 2015-05-01 DIAGNOSIS — Z0000 Encounter for general adult medical examination without abnormal findings: Secondary | ICD-10-CM | POA: Diagnosis not present

## 2015-05-01 DIAGNOSIS — K0889 Other specified disorders of teeth and supporting structures: Secondary | ICD-10-CM | POA: Diagnosis not present

## 2015-05-01 DIAGNOSIS — Z23 Encounter for immunization: Secondary | ICD-10-CM

## 2015-05-01 LAB — CBC WITH DIFFERENTIAL/PLATELET
BASOS ABS: 0 10*3/uL (ref 0.0–0.1)
Basophils Relative: 0.6 % (ref 0.0–3.0)
Eosinophils Absolute: 0.1 10*3/uL (ref 0.0–0.7)
Eosinophils Relative: 1.8 % (ref 0.0–5.0)
HEMATOCRIT: 41.5 % (ref 39.0–52.0)
Hemoglobin: 14 g/dL (ref 13.0–17.0)
LYMPHS PCT: 43.7 % (ref 12.0–46.0)
Lymphs Abs: 3 10*3/uL (ref 0.7–4.0)
MCHC: 33.8 g/dL (ref 30.0–36.0)
MCV: 90.7 fl (ref 78.0–100.0)
MONOS PCT: 8.2 % (ref 3.0–12.0)
Monocytes Absolute: 0.6 10*3/uL (ref 0.1–1.0)
NEUTROS PCT: 45.7 % (ref 43.0–77.0)
Neutro Abs: 3.1 10*3/uL (ref 1.4–7.7)
Platelets: 281 10*3/uL (ref 150.0–400.0)
RBC: 4.58 Mil/uL (ref 4.22–5.81)
RDW: 13.2 % (ref 11.5–15.5)
WBC: 6.8 10*3/uL (ref 4.0–10.5)

## 2015-05-01 LAB — URINALYSIS
BILIRUBIN URINE: NEGATIVE
Ketones, ur: NEGATIVE
LEUKOCYTES UA: NEGATIVE
NITRITE: NEGATIVE
PH: 6 (ref 5.0–8.0)
SPECIFIC GRAVITY, URINE: 1.02 (ref 1.000–1.030)
TOTAL PROTEIN, URINE-UPE24: NEGATIVE
Urine Glucose: NEGATIVE
Urobilinogen, UA: 0.2 (ref 0.0–1.0)

## 2015-05-01 LAB — HEPATIC FUNCTION PANEL
ALBUMIN: 4.1 g/dL (ref 3.5–5.2)
ALK PHOS: 50 U/L (ref 39–117)
ALT: 23 U/L (ref 0–53)
AST: 17 U/L (ref 0–37)
Bilirubin, Direct: 0.1 mg/dL (ref 0.0–0.3)
TOTAL PROTEIN: 7.2 g/dL (ref 6.0–8.3)
Total Bilirubin: 0.5 mg/dL (ref 0.2–1.2)

## 2015-05-01 LAB — BASIC METABOLIC PANEL
BUN: 13 mg/dL (ref 6–23)
CALCIUM: 9.7 mg/dL (ref 8.4–10.5)
CHLORIDE: 104 meq/L (ref 96–112)
CO2: 28 meq/L (ref 19–32)
Creatinine, Ser: 0.84 mg/dL (ref 0.40–1.50)
GFR: 107.63 mL/min (ref >60.00)
GLUCOSE: 108 mg/dL — AB (ref 70–99)
POTASSIUM: 4.4 meq/L (ref 3.5–5.1)
Sodium: 140 mEq/L (ref 135–145)

## 2015-05-01 LAB — LIPID PANEL
Cholesterol: 183 mg/dL (ref 0–200)
HDL: 50.8 mg/dL (ref >39.00)
LDL CALC: 117 mg/dL — AB (ref 0–99)
NONHDL: 131.72
Total CHOL/HDL Ratio: 4
Triglycerides: 76 mg/dL (ref 0.0–149.0)
VLDL: 15.2 mg/dL (ref 0.0–40.0)

## 2015-05-01 LAB — TSH: TSH: 1.37 u[IU]/mL (ref 0.35–4.50)

## 2015-05-01 MED ORDER — VITAMIN D 1000 UNITS PO TABS: 1000.0000 [IU] | ORAL_TABLET | Freq: Every day | ORAL | Status: AC

## 2015-05-01 MED ORDER — AMOXICILLIN 875 MG PO TABS: 875.0000 mg | ORAL_TABLET | Freq: Two times a day (BID) | ORAL | Status: DC

## 2015-05-01 NOTE — Progress Notes (Signed)
Pre visit review using our clinic review tool, if applicable. No additional management support is needed unless otherwise documented below in the visit note. 

## 2015-05-01 NOTE — Progress Notes (Signed)
Subjective:  Patient ID: Dean Glass, male    DOB: Mar 10, 1976  Age: 39 y.o. MRN: [id number]  CC: Annual Exam   HPI Dean Glass presents for a well exam. C/o R molar pain off and on. C/o lump on R neck off and on x 1 mo. Pt quit smoking  Outpatient Prescriptions Prior to Visit  Medication Sig Dispense Refill  . loratadine (CLARITIN) 10 MG tablet Take 1 tablet (10 mg total) by mouth daily as needed for allergies. 100 tablet 3  . omeprazole (PRILOSEC) 20 MG capsule Take 20 mg by mouth daily.     No facility-administered medications prior to visit.    ROS Review of Systems  Constitutional: Negative for appetite change, fatigue and unexpected weight change.  HENT: Negative for congestion, nosebleeds, sneezing, sore throat and trouble swallowing.   Eyes: Negative for itching and visual disturbance.  Respiratory: Negative for cough.   Cardiovascular: Negative for chest pain, palpitations and leg swelling.  Gastrointestinal: Negative for nausea, diarrhea, blood in stool and abdominal distention.  Genitourinary: Negative for frequency and hematuria.  Musculoskeletal: Negative for back pain, joint swelling, gait problem and neck pain.  Skin: Negative for rash and wound.  Neurological: Negative for dizziness, tremors, speech difficulty and weakness.  Psychiatric/Behavioral: Negative for suicidal ideas, sleep disturbance, dysphoric mood and agitation. The patient is not nervous/anxious.     Objective:  BP 130/90 mmHg  Pulse 88  Ht 5\' 7"  (1.702 m)  Wt 199 lb (90.266 kg)  BMI 31.16 kg/m2  SpO2 97%  BP Readings from Last 3 Encounters:  05/01/15 130/90  02/13/14 114/80  07/03/13 130/98    Wt Readings from Last 3 Encounters:  05/01/15 199 lb (90.266 kg)  02/13/14 185 lb 2 oz (83.972 kg)  07/03/13 178 lb (80.74 kg)    Physical Exam  Constitutional: He is oriented to person, place, and time. He appears well-developed. No distress.  NAD  HENT:  Mouth/Throat:  Oropharynx is clear and moist.  Eyes: Conjunctivae are normal. Pupils are equal, round, and reactive to light.  Neck: Normal range of motion. No JVD present. No thyromegaly present.  Cardiovascular: Normal rate, regular rhythm, normal heart sounds and intact distal pulses.  Exam reveals no gallop and no friction rub.   No murmur heard. Pulmonary/Chest: Effort normal and breath sounds normal. No respiratory distress. He has no wheezes. He has no rales. He exhibits no tenderness.  Abdominal: Soft. Bowel sounds are normal. He exhibits no distension and no mass. There is no tenderness. There is no rebound and no guarding.  Musculoskeletal: Normal range of motion. He exhibits no edema or tenderness.  Lymphadenopathy:    He has no cervical adenopathy.  Neurological: He is alert and oriented to person, place, and time. He has normal reflexes. No cranial nerve deficit. He exhibits normal muscle tone. He displays a negative Romberg sign. Coordination and gait normal.  Skin: Skin is warm and dry. No rash noted.  Psychiatric: He has a normal mood and affect. His behavior is normal. Judgment and thought content normal.  R upper molar caries No adenopathy now  Lab Results  Component Value Date   WBC 6.8 05/01/2015   HGB 14.0 05/01/2015   HCT 41.5 05/01/2015   PLT 281.0 05/01/2015   GLUCOSE 108* 05/01/2015   CHOL 183 05/01/2015   TRIG 76.0 05/01/2015   HDL 50.80 05/01/2015   LDLCALC 117* 05/01/2015   ALT 23 05/01/2015   AST 17 05/01/2015   NA 140  05/01/2015   K 4.4 05/01/2015   CL 104 05/01/2015   CREATININE 0.84 05/01/2015   BUN 13 05/01/2015   CO2 28 05/01/2015   TSH 1.37 05/01/2015    No results found.  Assessment & Plan:   Dean Glass was seen today for annual exam.  Diagnoses and all orders for this visit:  Well adult exam -     Basic metabolic panel; Future -     CBC with Differential/Platelet; Future -     Hepatic function panel; Future -     Lipid panel; Future -     TSH;  Future -     Urinalysis; Future  Toothache  Need for influenza vaccination -     Flu Vaccine QUAD 36+ mos IM  Other orders -     cholecalciferol (VITAMIN D) 1000 UNITS tablet; Take 1 tablet (1,000 Units total) by mouth daily. -     amoxicillin (AMOXIL) 875 MG tablet; Take 1 tablet (875 mg total) by mouth 2 (two) times daily.  I have discontinued Mr. Schewe omeprazole. I am also having him start on cholecalciferol and amoxicillin. Additionally, I am having him maintain his loratadine and ranitidine.  Meds ordered this encounter  Medications  . ranitidine (ZANTAC) 150 MG tablet    Sig: Take 150 mg by mouth daily.  . cholecalciferol (VITAMIN D) 1000 UNITS tablet    Sig: Take 1 tablet (1,000 Units total) by mouth daily.    Dispense:  100 tablet    Refill:  3  . amoxicillin (AMOXIL) 875 MG tablet    Sig: Take 1 tablet (875 mg total) by mouth 2 (two) times daily.    Dispense:  20 tablet    Refill:  0     Follow-up: Return in about 1 year (around 04/30/2016) for Wellness Exam.  Walker Kehr, MD

## 2015-05-01 NOTE — Assessment & Plan Note (Signed)
See a dentist Amoxicillin if adenopathy

## 2015-05-01 NOTE — Assessment & Plan Note (Signed)
We discussed age appropriate health related issues, including available/recomended screening tests and vaccinations. We discussed a need for adhering to healthy diet and exercise. Labs/EKG were reviewed/ordered. All questions were answered.   

## 2015-09-04 MED FILL — VICODIN ES 7.5-300 MG TAB: 7.5-300 | 1 days supply | Qty: 5 | Fill #0

## 2015-09-04 MED FILL — IBUPROFEN 600 MG TABLET: 600 | 5 days supply | Qty: 20 | Fill #0

## 2015-09-04 MED FILL — AMOXICILLIN 875 MG TABLET: 875 | 7 days supply | Qty: 14 | Fill #0

## 2017-03-18 ENCOUNTER — Ambulatory Visit (INDEPENDENT_AMBULATORY_CARE_PROVIDER_SITE_OTHER): Payer: 59 | Admitting: Internal Medicine

## 2017-03-18 ENCOUNTER — Other Ambulatory Visit (INDEPENDENT_AMBULATORY_CARE_PROVIDER_SITE_OTHER): Payer: 59

## 2017-03-18 ENCOUNTER — Encounter: Payer: Self-pay | Admitting: Internal Medicine

## 2017-03-18 VITALS — BP 116/82 | HR 88 | Temp 98.2°F | Ht 67.0 in | Wt 184.0 lb

## 2017-03-18 DIAGNOSIS — Z0000 Encounter for general adult medical examination without abnormal findings: Secondary | ICD-10-CM

## 2017-03-18 DIAGNOSIS — Z0001 Encounter for general adult medical examination with abnormal findings: Secondary | ICD-10-CM

## 2017-03-18 DIAGNOSIS — L570 Actinic keratosis: Secondary | ICD-10-CM | POA: Insufficient documentation

## 2017-03-18 LAB — LIPID PANEL
Cholesterol: 207 mg/dL — ABNORMAL HIGH (ref 0–200)
HDL: 32.2 mg/dL — ABNORMAL LOW (ref >39.00)
LDL Cholesterol: 135 mg/dL — ABNORMAL HIGH (ref 0–99)
NONHDL: 174.42
Total CHOL/HDL Ratio: 6
Triglycerides: 195 mg/dL — ABNORMAL HIGH (ref 0.0–149.0)
VLDL: 39 mg/dL (ref 0.0–40.0)

## 2017-03-18 LAB — URINALYSIS, ROUTINE W REFLEX MICROSCOPIC
LEUKOCYTES UA: NEGATIVE
NITRITE: NEGATIVE
Specific Gravity, Urine: >=1.03 — AB (ref 1.000–1.030)
Total Protein, Urine: NEGATIVE
URINE GLUCOSE: NEGATIVE
Urobilinogen, UA: 0.2 (ref 0.0–1.0)
WBC, UA: NONE SEEN (ref 0–?)
pH: 5.5 (ref 5.0–8.0)

## 2017-03-18 LAB — BASIC METABOLIC PANEL
BUN: 10 mg/dL (ref 6–23)
CALCIUM: 9.7 mg/dL (ref 8.4–10.5)
CO2: 25 meq/L (ref 19–32)
Chloride: 105 mEq/L (ref 96–112)
Creatinine, Ser: 0.91 mg/dL (ref 0.40–1.50)
GFR: 97.22 mL/min (ref >60.00)
GLUCOSE: 105 mg/dL — AB (ref 70–99)
POTASSIUM: 3.9 meq/L (ref 3.5–5.1)
SODIUM: 139 meq/L (ref 135–145)

## 2017-03-18 LAB — CBC WITH DIFFERENTIAL/PLATELET
Basophils Absolute: 0.1 10*3/uL (ref 0.0–0.1)
Basophils Relative: 0.9 % (ref 0.0–3.0)
EOS ABS: 0.1 10*3/uL (ref 0.0–0.7)
Eosinophils Relative: 1.5 % (ref 0.0–5.0)
HEMATOCRIT: 43.9 % (ref 39.0–52.0)
HEMOGLOBIN: 14.9 g/dL (ref 13.0–17.0)
LYMPHS PCT: 31.6 % (ref 12.0–46.0)
Lymphs Abs: 2.9 10*3/uL (ref 0.7–4.0)
MCHC: 34 g/dL (ref 30.0–36.0)
MCV: 91.3 fl (ref 78.0–100.0)
MONO ABS: 0.6 10*3/uL (ref 0.1–1.0)
Monocytes Relative: 6.6 % (ref 3.0–12.0)
NEUTROS PCT: 59.4 % (ref 43.0–77.0)
Neutro Abs: 5.4 10*3/uL (ref 1.4–7.7)
PLATELETS: 274 10*3/uL (ref 150.0–400.0)
RBC: 4.81 Mil/uL (ref 4.22–5.81)
RDW: 13 % (ref 11.5–15.5)
WBC: 9.2 10*3/uL (ref 4.0–10.5)

## 2017-03-18 LAB — HEPATIC FUNCTION PANEL
ALK PHOS: 54 U/L (ref 39–117)
ALT: 24 U/L (ref 0–53)
AST: 15 U/L (ref 0–37)
Albumin: 4.3 g/dL (ref 3.5–5.2)
BILIRUBIN DIRECT: 0.1 mg/dL (ref 0.0–0.3)
Total Bilirubin: 0.9 mg/dL (ref 0.2–1.2)
Total Protein: 6.8 g/dL (ref 6.0–8.3)

## 2017-03-18 LAB — TSH: TSH: 1.72 u[IU]/mL (ref 0.35–4.50)

## 2017-03-18 LAB — PSA: PSA: 2.03 ng/mL (ref 0.10–4.00)

## 2017-03-18 MED ORDER — VITAMIN D 1000 UNITS PO TABS: 1000.0000 [IU] | ORAL_TABLET | Freq: Every day | ORAL | 3 refills | Status: AC

## 2017-03-18 NOTE — Progress Notes (Signed)
Subjective:  Patient ID: Dean Glass, male    DOB: 07/30/1975  Age: 41 y.o. MRN: [id number]  CC: No chief complaint on file.   HPI ODEAN FESTER presents for a well exam  Outpatient Medications Prior to Visit  Medication Sig Dispense Refill  . loratadine (CLARITIN) 10 MG tablet Take 1 tablet (10 mg total) by mouth daily as needed for allergies. 100 tablet 3  . ranitidine (ZANTAC) 150 MG tablet Take 150 mg by mouth daily.    Marland Kitchen amoxicillin (AMOXIL) 875 MG tablet Take 1 tablet (875 mg total) by mouth 2 (two) times daily. 20 tablet 0   No facility-administered medications prior to visit.     ROS Review of Systems  Constitutional: Negative for appetite change, fatigue and unexpected weight change.  HENT: Negative for congestion, nosebleeds, sneezing, sore throat and trouble swallowing.   Eyes: Negative for itching and visual disturbance.  Respiratory: Negative for cough.   Cardiovascular: Negative for chest pain, palpitations and leg swelling.  Gastrointestinal: Negative for abdominal distention, blood in stool, diarrhea and nausea.  Genitourinary: Negative for frequency and hematuria.  Musculoskeletal: Negative for back pain, gait problem, joint swelling and neck pain.  Skin: Negative for rash.  Neurological: Negative for dizziness, tremors, speech difficulty and weakness.  Psychiatric/Behavioral: Negative for agitation, dysphoric mood and sleep disturbance. The patient is not nervous/anxious.     Objective:  BP 116/82 (BP Location: Left Arm, Patient Position: Sitting, Cuff Size: Normal)   Pulse 88   Temp 98.2 F (36.8 C) (Oral)   Ht 5\' 7"  (1.702 m)   Wt 184 lb (83.5 kg)   SpO2 99%   BMI 28.82 kg/m   BP Readings from Last 3 Encounters:  03/18/17 116/82  05/01/15 130/90  02/13/14 114/80    Wt Readings from Last 3 Encounters:  03/18/17 184 lb (83.5 kg)  05/01/15 199 lb (90.3 kg)  02/13/14 185 lb 2 oz (84 kg)    Physical Exam  Constitutional: He is  oriented to person, place, and time. He appears well-developed. No distress.  NAD  HENT:  Mouth/Throat: Oropharynx is clear and moist.  Eyes: Pupils are equal, round, and reactive to light. Conjunctivae are normal.  Neck: Normal range of motion. No JVD present. No thyromegaly present.  Cardiovascular: Normal rate, regular rhythm, normal heart sounds and intact distal pulses.  Exam reveals no gallop and no friction rub.   No murmur heard. Pulmonary/Chest: Effort normal and breath sounds normal. No respiratory distress. He has no wheezes. He has no rales. He exhibits no tenderness.  Abdominal: Soft. Bowel sounds are normal. He exhibits no distension and no mass. There is no tenderness. There is no rebound and no guarding.  Musculoskeletal: Normal range of motion. He exhibits no edema or tenderness.  Lymphadenopathy:    He has no cervical adenopathy.  Neurological: He is alert and oriented to person, place, and time. He has normal reflexes. No cranial nerve deficit. He exhibits normal muscle tone. He displays a negative Romberg sign. Coordination and gait normal.  Skin: Skin is warm and dry. No rash noted.  Psychiatric: He has a normal mood and affect. His behavior is normal. Judgment and thought content normal.  AKs on face Testes - self exam   Procedure Note :     Procedure : Cryosurgery   Indication:  Actinic keratosis(es)   Risks including unsuccessful procedure , bleeding, infection, bruising, scar, a need for a repeat  procedure and others were explained to the  patient in detail as well as the benefits. Informed consent was obtained verbally.    6 lesion(s)  on face, scalp, ears  was/were treated with liquid nitrogen on a Q-tip in a usual fasion . Band-Aid was applied and antibiotic ointment was given for a later use.   Tolerated well. Complications none.      Lab Results  Component Value Date   WBC 6.8 05/01/2015   HGB 14.0 05/01/2015   HCT 41.5 05/01/2015   PLT 281.0  05/01/2015   GLUCOSE 108 (H) 05/01/2015   CHOL 183 05/01/2015   TRIG 76.0 05/01/2015   HDL 50.80 05/01/2015   LDLCALC 117 (H) 05/01/2015   ALT 23 05/01/2015   AST 17 05/01/2015   NA 140 05/01/2015   K 4.4 05/01/2015   CL 104 05/01/2015   CREATININE 0.84 05/01/2015   BUN 13 05/01/2015   CO2 28 05/01/2015   TSH 1.37 05/01/2015    No results found.  Assessment & Plan:   There are no diagnoses linked to this encounter. I have discontinued Mr. Westry amoxicillin. I am also having him maintain his loratadine and ranitidine.  No orders of the defined types were placed in this encounter.    Follow-up: No Follow-up on file.  Walker Kehr, MD

## 2017-03-18 NOTE — Assessment & Plan Note (Addendum)
B temples Cryo Derm ref

## 2017-03-18 NOTE — Assessment & Plan Note (Signed)
We discussed age appropriate health related issues, including available/recomended screening tests and vaccinations. We discussed a need for adhering to healthy diet and exercise. Labs were ordered to be later reviewed . All questions were answered.   

## 2017-03-18 NOTE — Patient Instructions (Signed)
   Postprocedure instructions :     Keep the wounds clean. You can wash them with liquid soap and water. Pat dry with gauze or a Kleenex tissue  Before applying antibiotic ointment and a Band-Aid.   You need to report immediately  if  any signs of infection develop.    

## 2017-04-11 ENCOUNTER — Encounter: Payer: Self-pay | Admitting: Family Medicine

## 2017-04-11 ENCOUNTER — Ambulatory Visit (INDEPENDENT_AMBULATORY_CARE_PROVIDER_SITE_OTHER): Payer: 59 | Admitting: Family Medicine

## 2017-04-11 VITALS — BP 116/78 | HR 80 | Temp 98.1°F | Ht 67.0 in | Wt 185.0 lb

## 2017-04-11 DIAGNOSIS — J069 Acute upper respiratory infection, unspecified: Secondary | ICD-10-CM | POA: Insufficient documentation

## 2017-04-11 NOTE — Patient Instructions (Signed)
Thank you for coming in,   Please try things such as zyrtec-D or allegra-D which is an antihistamine and decongestant.   Please try afrin which will help with nasal congestion but use for only three days.   Please also try using a netti pot on a regular occasion.  Honey can help with a sore throat.      Please feel free to call with any questions or concerns at any time, at [phone]. --Dr. Alexa Blish  

## 2017-04-11 NOTE — Assessment & Plan Note (Signed)
Symptoms are most consistent with viral origin. - Reassurance provided - Counseled on supportive care - The symptoms persist for 2 weeks could consider sending an antibiotic in.

## 2017-04-11 NOTE — Progress Notes (Signed)
  Dean Glass - 41 y.o. male MRN [id number]  Date of birth: 10-24-1975  SUBJECTIVE:  Including CC & ROS.  Chief Complaint  Patient presents with  . Sinusitis    Started friday with with sinus congestion, sore throat, moved to chest congestion. Has had nausea today but took dayquil and does not know if it coming from that.    [name] is a 41 yo M that is presenting with sinus congestion. His symptoms started on Friday and have been intermittent in nature. He felt better yesterday but have gotten worse today. Having some sinus congestion and pressure. Has taken DayQuil. Denies any fevers. Has had no sick contacts. Denies any travel. Denies any rash. No itchy watery eyes. Has not received the flu vaccine yet.     Review of Systems  Constitutional: Negative for fever.  HENT: Positive for sinus pressure.   Respiratory: Positive for cough. Negative for shortness of breath.   Musculoskeletal: Negative for gait problem.    HISTORY: Past Medical, Surgical, Social, and Family History Reviewed & Updated per EMR.   Pertinent Historical Findings include:  Past Medical History:  Diagnosis Date  . GERD (gastroesophageal reflux disease)   . Seasonal allergies     Past Surgical History:  Procedure Laterality Date  . VASECTOMY      No Known Allergies  Family History  Problem Relation Age of Onset  . Diabetes Mother   . Coronary artery disease Father   . Heart disease Father        cad  . Early death Brother 83       cystic kidney     Social History   Social History  . Marital status: Married    Spouse name: N/A  . Number of children: N/A  . Years of education: N/A   Occupational History  . Not on file.   Social History Main Topics  . Smoking status: Former Smoker    Packs/day: 1.00    Types: Cigarettes    Quit date: 03/13/2015  . Smokeless tobacco: Former Systems developer  . Alcohol use 0.0 oz/week     Comment: Social  . Drug use: No  . Sexual activity: Yes   Other  Topics Concern  . Not on file   Social History Narrative  . No narrative on file     PHYSICAL EXAM:  VS: BP 116/78 (BP Location: Left Arm, Patient Position: Sitting, Cuff Size: Large)   Pulse 80   Temp 98.1 F (36.7 C) (Oral)   Ht 5\' 7"  (1.702 m)   Wt 185 lb (83.9 kg)   SpO2 99%   BMI 28.98 kg/m  Physical Exam Gen: NAD, alert, cooperative with exam,  ENT: normal lips, normal nasal mucosa, tympanic membranes clear and intact bilaterally, normal oropharynx, no cervical adenopathy, no tonsillar exudates  Eye: normal EOM, normal conjunctiva and lids CV:  no edema, +2 pedal pulses, S1-S2, regular rate and rhythm   Resp: no accessory muscle use, non-labored, clear to auscultation bilaterally, no crackles or wheezes Skin: no rashes, no areas of induration  Neuro: normal tone, normal sensation to touch Psych:  normal insight, alert and oriented MSK: Normal gait, normal strength      ASSESSMENT & PLAN:   Upper respiratory tract infection Symptoms are most consistent with viral origin. - Reassurance provided - Counseled on supportive care - The symptoms persist for 2 weeks could consider sending an antibiotic in.

## 2018-03-27 ENCOUNTER — Ambulatory Visit (INDEPENDENT_AMBULATORY_CARE_PROVIDER_SITE_OTHER): Payer: 59 | Admitting: Internal Medicine

## 2018-03-27 ENCOUNTER — Encounter: Payer: Self-pay | Admitting: Internal Medicine

## 2018-03-27 ENCOUNTER — Other Ambulatory Visit (INDEPENDENT_AMBULATORY_CARE_PROVIDER_SITE_OTHER): Payer: 59

## 2018-03-27 VITALS — BP 118/80 | HR 82 | Temp 98.3°F | Ht 67.0 in | Wt 184.0 lb

## 2018-03-27 DIAGNOSIS — Z0000 Encounter for general adult medical examination without abnormal findings: Secondary | ICD-10-CM

## 2018-03-27 DIAGNOSIS — F17200 Nicotine dependence, unspecified, uncomplicated: Secondary | ICD-10-CM | POA: Diagnosis not present

## 2018-03-27 LAB — URINALYSIS
Bilirubin Urine: NEGATIVE
Ketones, ur: NEGATIVE
Leukocytes, UA: NEGATIVE
Nitrite: NEGATIVE
Specific Gravity, Urine: 1.025 (ref 1.000–1.030)
TOTAL PROTEIN, URINE-UPE24: NEGATIVE
Urine Glucose: NEGATIVE
Urobilinogen, UA: 0.2 (ref 0.0–1.0)
pH: 5.5 (ref 5.0–8.0)

## 2018-03-27 LAB — LIPID PANEL
CHOLESTEROL: 199 mg/dL (ref 0–200)
HDL: 36.3 mg/dL — ABNORMAL LOW (ref >39.00)
LDL CALC: 130 mg/dL — AB (ref 0–99)
NonHDL: 162.57
Total CHOL/HDL Ratio: 5
Triglycerides: 164 mg/dL — ABNORMAL HIGH (ref 0.0–149.0)
VLDL: 32.8 mg/dL (ref 0.0–40.0)

## 2018-03-27 LAB — HEPATIC FUNCTION PANEL
ALBUMIN: 4.6 g/dL (ref 3.5–5.2)
ALK PHOS: 61 U/L (ref 39–117)
ALT: 27 U/L (ref 0–53)
AST: 19 U/L (ref 0–37)
Bilirubin, Direct: 0.1 mg/dL (ref 0.0–0.3)
Total Bilirubin: 0.7 mg/dL (ref 0.2–1.2)
Total Protein: 7.3 g/dL (ref 6.0–8.3)

## 2018-03-27 LAB — CBC WITH DIFFERENTIAL/PLATELET
BASOS PCT: 0.8 % (ref 0.0–3.0)
Basophils Absolute: 0.1 10*3/uL (ref 0.0–0.1)
EOS ABS: 0.1 10*3/uL (ref 0.0–0.7)
Eosinophils Relative: 1.1 % (ref 0.0–5.0)
HCT: 43.8 % (ref 39.0–52.0)
HEMOGLOBIN: 15.3 g/dL (ref 13.0–17.0)
Lymphocytes Relative: 35.3 % (ref 12.0–46.0)
Lymphs Abs: 3.6 10*3/uL (ref 0.7–4.0)
MCHC: 34.8 g/dL (ref 30.0–36.0)
MCV: 89.6 fl (ref 78.0–100.0)
Monocytes Absolute: 0.6 10*3/uL (ref 0.1–1.0)
Monocytes Relative: 6 % (ref 3.0–12.0)
Neutro Abs: 5.8 10*3/uL (ref 1.4–7.7)
Neutrophils Relative %: 56.8 % (ref 43.0–77.0)
Platelets: 281 10*3/uL (ref 150.0–400.0)
RBC: 4.89 Mil/uL (ref 4.22–5.81)
RDW: 13 % (ref 11.5–15.5)
WBC: 10.3 10*3/uL (ref 4.0–10.5)

## 2018-03-27 LAB — BASIC METABOLIC PANEL
BUN: 11 mg/dL (ref 6–23)
CHLORIDE: 102 meq/L (ref 96–112)
CO2: 28 mEq/L (ref 19–32)
CREATININE: 0.8 mg/dL (ref 0.40–1.50)
Calcium: 9.7 mg/dL (ref 8.4–10.5)
GFR: 112.25 mL/min (ref >60.00)
Glucose, Bld: 94 mg/dL (ref 70–99)
Potassium: 4 mEq/L (ref 3.5–5.1)
Sodium: 137 mEq/L (ref 135–145)

## 2018-03-27 LAB — TSH: TSH: 1.52 u[IU]/mL (ref 0.35–4.50)

## 2018-03-27 LAB — PSA: PSA: 1.83 ng/mL (ref 0.10–4.00)

## 2018-03-27 NOTE — Progress Notes (Signed)
Subjective:  Patient ID: Dean Glass, male    DOB: 09/18/1975  Age: 42 y.o. MRN: [id number]  CC: No chief complaint on file.   HPI EREN RYSER presents for a well exam  Outpatient Medications Prior to Visit  Medication Sig Dispense Refill  . loratadine (CLARITIN) 10 MG tablet Take 1 tablet (10 mg total) by mouth daily as needed for allergies. 100 tablet 3   No facility-administered medications prior to visit.     ROS: Review of Systems  Constitutional: Negative for appetite change, fatigue and unexpected weight change.  HENT: Negative for congestion, nosebleeds, sneezing, sore throat and trouble swallowing.   Eyes: Negative for itching and visual disturbance.  Respiratory: Negative for cough.   Cardiovascular: Negative for chest pain, palpitations and leg swelling.  Gastrointestinal: Negative for abdominal distention, blood in stool, diarrhea and nausea.  Genitourinary: Negative for frequency and hematuria.  Musculoskeletal: Negative for back pain, gait problem, joint swelling and neck pain.  Skin: Negative for rash.  Neurological: Negative for dizziness, tremors, speech difficulty and weakness.  Psychiatric/Behavioral: Negative for agitation, dysphoric mood, sleep disturbance and suicidal ideas. The patient is not nervous/anxious.     Objective:  BP 118/80 (BP Location: Left Arm, Patient Position: Sitting, Cuff Size: Normal)   Pulse 82   Temp 98.3 F (36.8 C) (Oral)   Ht 5\' 7"  (1.702 m)   Wt 184 lb (83.5 kg)   SpO2 95%   BMI 28.82 kg/m   BP Readings from Last 3 Encounters:  03/27/18 118/80  04/11/17 116/78  03/18/17 116/82    Wt Readings from Last 3 Encounters:  03/27/18 184 lb (83.5 kg)  04/11/17 185 lb (83.9 kg)  03/18/17 184 lb (83.5 kg)    Physical Exam  Constitutional: He is oriented to person, place, and time. He appears well-developed. No distress.  NAD  HENT:  Mouth/Throat: Oropharynx is clear and moist.  Eyes: Pupils are equal,  round, and reactive to light. Conjunctivae are normal.  Neck: Normal range of motion. No JVD present. No thyromegaly present.  Cardiovascular: Normal rate, regular rhythm, normal heart sounds and intact distal pulses. Exam reveals no gallop and no friction rub.  No murmur heard. Pulmonary/Chest: Effort normal and breath sounds normal. No respiratory distress. He has no wheezes. He has no rales. He exhibits no tenderness.  Abdominal: Soft. Bowel sounds are normal. He exhibits no distension and no mass. There is no tenderness. There is no rebound and no guarding.  Musculoskeletal: Normal range of motion. He exhibits no edema or tenderness.  Lymphadenopathy:    He has no cervical adenopathy.  Neurological: He is alert and oriented to person, place, and time. He has normal reflexes. No cranial nerve deficit. He exhibits normal muscle tone. He displays a negative Romberg sign. Coordination and gait normal.  Skin: Skin is warm and dry. No rash noted.  Psychiatric: He has a normal mood and affect. His behavior is normal. Judgment and thought content normal.  testes - self exam  Lab Results  Component Value Date   WBC 9.2 03/18/2017   HGB 14.9 03/18/2017   HCT 43.9 03/18/2017   PLT 274.0 03/18/2017   GLUCOSE 105 (H) 03/18/2017   CHOL 207 (H) 03/18/2017   TRIG 195.0 (H) 03/18/2017   HDL 32.20 (L) 03/18/2017   LDLCALC 135 (H) 03/18/2017   ALT 24 03/18/2017   AST 15 03/18/2017   NA 139 03/18/2017   K 3.9 03/18/2017   CL 105 03/18/2017  CREATININE 0.91 03/18/2017   BUN 10 03/18/2017   CO2 25 03/18/2017   TSH 1.72 03/18/2017   PSA 2.03 03/18/2017    No results found.  Assessment & Plan:   There are no diagnoses linked to this encounter.   No orders of the defined types were placed in this encounter.    Follow-up: No follow-ups on file.  Walker Kehr, MD

## 2018-03-27 NOTE — Assessment & Plan Note (Signed)
discussed

## 2018-03-27 NOTE — Assessment & Plan Note (Signed)
We discussed age appropriate health related issues, including available/recomended screening tests and vaccinations. We discussed a need for adhering to healthy diet and exercise. Labs/EKG were reviewed/ordered. All questions were answered.   

## 2018-05-22 ENCOUNTER — Ambulatory Visit (INDEPENDENT_AMBULATORY_CARE_PROVIDER_SITE_OTHER): Payer: 59 | Admitting: Internal Medicine

## 2018-05-22 ENCOUNTER — Encounter: Payer: Self-pay | Admitting: Internal Medicine

## 2018-05-22 DIAGNOSIS — M7711 Lateral epicondylitis, right elbow: Secondary | ICD-10-CM | POA: Diagnosis not present

## 2018-05-22 DIAGNOSIS — M7710 Lateral epicondylitis, unspecified elbow: Secondary | ICD-10-CM | POA: Insufficient documentation

## 2018-05-22 MED ORDER — MELOXICAM 15 MG PO TABS: 15.0000 mg | ORAL_TABLET | Freq: Every day | ORAL | 1 refills | Status: DC

## 2018-05-22 MED FILL — MELOXICAM 15 MG TABLET: 15 | 30 days supply | Qty: 30 | Fill #0

## 2018-05-22 NOTE — Assessment & Plan Note (Addendum)
Try thera band for tennis elbow Ice/Heat Spiky ball massage Meloxicam po pc Sleeve

## 2018-05-22 NOTE — Patient Instructions (Addendum)
Tennis Elbow Tennis elbow (lateral epicondylitis) is inflammation of the outer tendons of your forearm close to your elbow. Your tendons attach your muscles to your bones. The outer tendons of your forearm are used to extend your wrist, and they attach on the outside part of your elbow. Tennis elbow is often found in people who play tennis, but anyone may get the condition from repeatedly extending the wrist or turning the forearm. What are the causes? This condition is caused by repeatedly extending your wrist and using your hands. It can result from sports or work that requires repetitive forearm movements. Tennis elbow may also be caused by an injury. What increases the risk? You have a higher risk of developing tennis elbow if you play tennis or another racquet sport. You also have a higher risk if you frequently use your hands for work. This condition is also more likely to develop in:  Musicians.  Carpenters, painters, and plumbers.  Cooks.  Cashiers.  People who work in factories.  Construction workers.  Butchers.  People who use computers.  What are the signs or symptoms? Symptoms of this condition include:  Pain and tenderness in your forearm and the outer part of your elbow. You may only feel the pain when you use your arm, or you may feel it even when you are not using your arm.  A burning feeling that runs from your elbow through your arm.  Weak grip in your hands.  How is this diagnosed? This condition may be diagnosed by medical history and physical exam. You may also have other tests, including:  X-rays.  MRI.  How is this treated? Your health care provider will recommend lifestyle adjustments, such as resting and icing your arm. Treatment may also include:  Medicines for inflammation. This may include shots of cortisone if your pain continues.  Physical therapy. This may include massage or exercises.  An elbow brace.  Surgery may eventually be  recommended if your pain does not go away with treatment. Follow these instructions at home: Activity  Rest your elbow and wrist as directed by your health care provider. Try to avoid any activities that caused the problem until your health care provider says that you can do them again.  If a physical therapist teaches you exercises, do all of them as directed.  If you lift an object, lift it with your palm facing upward. This lowers the stress on your elbow. Lifestyle  If your tennis elbow is caused by sports, check your equipment and make sure that: ? You are using it correctly. ? It is the best fit for you.  If your tennis elbow is caused by work, take breaks frequently, if you are able. Talk with your manager about how to best perform tasks in a way that is safe. ? If your tennis elbow is caused by computer use, talk with your manager about any changes that can be made to your work environment. General instructions  If directed, apply ice to the painful area: ? Put ice in a plastic bag. ? Place a towel between your skin and the bag. ? Leave the ice on for 20 minutes, 2-3 times per day.  Take medicines only as directed by your health care provider.  If you were given a brace, wear it as directed by your health care provider.  Keep all follow-up visits as directed by your health care provider. This is important. Contact a health care provider if:  Your pain does not   get better with treatment.  Your pain gets worse.  You have numbness or weakness in your forearm, hand, or fingers. This information is not intended to replace advice given to you by your health care provider. Make sure you discuss any questions you have with your health care provider. Document Released: 06/14/2005 Document Revised: 02/12/2016 Document Reviewed: 06/10/2014 Elsevier Interactive Patient Education  2018 [employer].    Try thera band for tennis elbow Ice/Heat Spiky ball massage

## 2018-05-22 NOTE — Progress Notes (Signed)
   Subjective:  Patient ID: Dean Glass, male    DOB: 26-Apr-1976  Age: 42 y.o. MRN: [id number]  CC: No chief complaint on file.   HPI Dean Glass presents for R elbow pain x weeks. No injury recollection  Outpatient Medications Prior to Visit  Medication Sig Dispense Refill  . loratadine (CLARITIN) 10 MG tablet Take 1 tablet (10 mg total) by mouth daily as needed for allergies. 100 tablet 3   No facility-administered medications prior to visit.     ROS: Review of Systems  Respiratory: Negative for chest tightness and shortness of breath.   Musculoskeletal: Negative for neck pain and neck stiffness.    Objective:  BP 124/80 (BP Location: Left Arm, Patient Position: Sitting, Cuff Size: Normal)   Pulse 95   Temp 98 F (36.7 C) (Oral)   Ht 5\' 7"  (1.702 m)   Wt 188 lb (85.3 kg)   SpO2 96%   BMI 29.44 kg/m   BP Readings from Last 3 Encounters:  05/22/18 124/80  03/27/18 118/80  04/11/17 116/78    Wt Readings from Last 3 Encounters:  05/22/18 188 lb (85.3 kg)  03/27/18 184 lb (83.5 kg)  04/11/17 185 lb (83.9 kg)    Physical Exam  Constitutional: He appears well-developed and well-nourished.  Musculoskeletal: He exhibits tenderness.  Skin: Skin is warm and dry. No rash noted. No erythema.  R lat elbow is tender to palpation  Lab Results  Component Value Date   WBC 10.3 03/27/2018   HGB 15.3 03/27/2018   HCT 43.8 03/27/2018   PLT 281.0 03/27/2018   GLUCOSE 94 03/27/2018   CHOL 199 03/27/2018   TRIG 164.0 (H) 03/27/2018   HDL 36.30 (L) 03/27/2018   LDLCALC 130 (H) 03/27/2018   ALT 27 03/27/2018   AST 19 03/27/2018   NA 137 03/27/2018   K 4.0 03/27/2018   CL 102 03/27/2018   CREATININE 0.80 03/27/2018   BUN 11 03/27/2018   CO2 28 03/27/2018   TSH 1.52 03/27/2018   PSA 1.83 03/27/2018    No results found.  Assessment & Plan:   Diagnoses and all orders for this visit:  Lateral epicondylitis of right elbow  Other orders -     meloxicam  (MOBIC) 15 MG tablet; Take 1 tablet (15 mg total) by mouth daily.     Meds ordered this encounter  Medications  . meloxicam (MOBIC) 15 MG tablet    Sig: Take 1 tablet (15 mg total) by mouth daily.    Dispense:  30 tablet    Refill:  1     Follow-up: No follow-ups on file.  Walker Kehr, MD

## 2018-06-02 ENCOUNTER — Encounter: Payer: Self-pay | Admitting: Nurse Practitioner

## 2018-06-02 ENCOUNTER — Ambulatory Visit (INDEPENDENT_AMBULATORY_CARE_PROVIDER_SITE_OTHER): Payer: 59 | Admitting: Nurse Practitioner

## 2018-06-02 VITALS — BP 120/76 | HR 89 | Temp 97.9°F | Ht 67.0 in | Wt 185.0 lb

## 2018-06-02 DIAGNOSIS — J069 Acute upper respiratory infection, unspecified: Secondary | ICD-10-CM | POA: Diagnosis not present

## 2018-06-02 DIAGNOSIS — J029 Acute pharyngitis, unspecified: Secondary | ICD-10-CM

## 2018-06-02 LAB — POCT RAPID STREP A (OFFICE): Rapid Strep A Screen: NEGATIVE

## 2018-06-02 MED ORDER — FLUTICASONE PROPIONATE 50 MCG/ACT NA SUSP: 2.0000 | Freq: Every day | NASAL | 0 refills | Status: DC

## 2018-06-02 MED FILL — FLUTICASONE PROP 50 MCG SPR: 50 | 30 days supply | Qty: 16 | Fill #0

## 2018-06-02 NOTE — Patient Instructions (Addendum)
I have sent a prescription for flonase nasal spray to your pharmacy- you may start with 2 sprays in each nostril daily then reduce to 1 spray in each nostril daily when your symptoms improve You may also take an over the counter allergy medication such as claritin or zyrtec for your symptoms. Saline nasal spray used frequently. Ibuprofen 600 mg every 6 hours as needed for pain, discomfort or fever. Drink plenty of fluids and stay well-hydrated.   Upper Respiratory Infection, Adult Most upper respiratory infections (URIs) are caused by a virus. A URI affects the nose, throat, and upper air passages. The most common type of URI is often called "the common cold." Follow these instructions at home:  Take medicines only as told by your doctor.  Gargle warm saltwater or take cough drops to comfort your throat as told by your doctor.  Use a warm mist humidifier or inhale steam from a shower to increase air moisture. This may make it easier to breathe.  Drink enough fluid to keep your pee (urine) clear or pale yellow.  Eat soups and other clear broths.  Have a healthy diet.  Rest as needed.  Go back to work when your fever is gone or your doctor says it is okay. ? You may need to stay home longer to avoid giving your URI to others. ? You can also wear a face mask and wash your hands often to prevent spread of the virus.  Use your inhaler more if you have asthma.  Do not use any tobacco products, including cigarettes, chewing tobacco, or electronic cigarettes. If you need help quitting, ask your doctor. Contact a doctor if:  You are getting worse, not better.  Your symptoms are not helped by medicine.  You have chills.  You are getting more short of breath.  You have brown or red mucus.  You have yellow or brown discharge from your nose.  You have pain in your face, especially when you bend forward.  You have a fever.  You have puffy (swollen) neck glands.  You have pain  while swallowing.  You have white areas in the back of your throat. Get help right away if:  You have very bad or constant: ? Headache. ? Ear pain. ? Pain in your forehead, behind your eyes, and over your cheekbones (sinus pain). ? Chest pain.  You have long-lasting (chronic) lung disease and any of the following: ? Wheezing. ? Long-lasting cough. ? Coughing up blood. ? A change in your usual mucus.  You have a stiff neck.  You have changes in your: ? Vision. ? Hearing. ? Thinking. ? Mood. This information is not intended to replace advice given to you by your health care provider. Make sure you discuss any questions you have with your health care provider. Document Released: 12/01/2007 Document Revised: 02/15/2016 Document Reviewed: 09/19/2013 Elsevier Interactive Patient Education  2018 [employer].

## 2018-06-02 NOTE — Progress Notes (Signed)
NISHANT SCHRECENGOST is a 41 y.o. male with the following history as recorded in [employer]:  Patient Active Problem List   Diagnosis Date Noted  . Tennis elbow 05/22/2018  . Upper respiratory tract infection 04/11/2017  . Actinic keratoses 03/18/2017  . Well adult exam 05/01/2015  . Toothache 05/01/2015  . NEOPLASM OF UNCERTAIN BEHAVIOR OF SKIN 03/31/2010  . Allergic rhinitis due to other allergen 10/15/2009  . TOBACCO USER 07/25/2009    Current Outpatient Medications  Medication Sig Dispense Refill  . loratadine (CLARITIN) 10 MG tablet Take 1 tablet (10 mg total) by mouth daily as needed for allergies. 100 tablet 3  . fluticasone (FLONASE) 50 MCG/ACT nasal spray Place 2 sprays into both nostrils daily. 16 g 0  . meloxicam (MOBIC) 15 MG tablet Take 1 tablet (15 mg total) by mouth daily. (Patient not taking: Reported on 06/02/2018) 30 tablet 1   No current facility-administered medications for this visit.     Allergies: Patient has no known allergies.  Past Medical History:  Diagnosis Date  . GERD (gastroesophageal reflux disease)   . Seasonal allergies     Past Surgical History:  Procedure Laterality Date  . VASECTOMY      Family History  Problem Relation Age of Onset  . Diabetes Mother   . Coronary artery disease Father   . Heart disease Father        cad  . Early death Brother 60       cystic kidney    Social History   Tobacco Use  . Smoking status: Current Every Day Smoker    Packs/day: 0.50    Types: Cigarettes    Last attempt to quit: 03/13/2015    Years since quitting: 3.2  . Smokeless tobacco: Former Network engineer Use Topics  . Alcohol use: Yes    Alcohol/week: 0.0 standard drinks    Comment: Social     Subjective:  Mr Maziarz is here today requesting evaluation of an acute complaint of sinus/cold symptoms, first began about 2-3 days ago with sore throat, then yesterday woke with  "raw sinuses," postnasal drip, nasal congestion, cough, body aches,  malaise. Denies: fevers, chills, syncope, chest pain ,sob, n/v, abdominal pain Tried at home: afrin, OTC equate cold medicine with minimal relief  ROS- See HPI  Objective:  Vitals:   06/02/18 1440  BP: 120/76  Pulse: 89  Temp: 97.9 F (36.6 C)  TempSrc: Oral  SpO2: 96%  Weight: 185 lb (83.9 kg)  Height: 5\' 7"  (1.702 m)    General: Well developed, well nourished, in no acute distress  Skin : Warm and dry.  Head: Normocephalic and atraumatic  Eyes: Sclera and conjunctiva clear; pupils round and reactive to light; extraocular movements intact  Ears: External normal; canals clear; tympanic membranes bulging, without erythema or effusion Oropharynx: Posterior oropharyngeal edema. No suspicious lesions  Neck: Supple without thyromegaly, adenopathy  Lungs: Respirations unlabored; clear to auscultation bilaterally without wheeze, rales, rhonchi  CVS exam: normal rate and regular rhythm, S1 and S2 normal.  Extremities: No edema, cyanosis Vessels: Symmetric bilaterally   Neurologic: Alert and oriented; speech intact; face symmetrical; moves all extremities well; CNII-XII intact without focal deficit  Psychiatric: Normal mood and affect.   Assessment:  1. Sore throat   2. Upper respiratory tract infection, unspecified type     Plan:   Strep test negative today Likely viral at this point, which we discussed Home management, red flags and return precautions including when to seek  immediate care discussed and printed on AVS He was instructed to follow up if symptoms continue for >/= 1 week   No follow-ups on file.  Orders Placed This Encounter  Procedures  . POCT rapid strep A    Requested Prescriptions   Signed Prescriptions Disp Refills  . fluticasone (FLONASE) 50 MCG/ACT nasal spray 16 g 0    Sig: Place 2 sprays into both nostrils daily.

## 2018-06-07 ENCOUNTER — Telehealth: Payer: Self-pay | Admitting: Internal Medicine

## 2018-06-07 MED ORDER — AZITHROMYCIN 250 MG PO TABS: ORAL_TABLET | ORAL | 0 refills | Status: DC

## 2018-06-07 MED FILL — MELOXICAM 15 MG TABLET: 15 | 30 days supply | Qty: 30 | Fill #0

## 2018-06-07 MED FILL — AZITHROMYCIN 250 MG TABLET: 250 | 5 days supply | Qty: 6 | Fill #0

## 2018-06-07 NOTE — Telephone Encounter (Signed)
Antibiotic course sent, z-pak. Please follow up if symptoms persist after antibiotic course

## 2018-06-07 NOTE — Telephone Encounter (Signed)
He saw Hollie Beach- will route to her;

## 2018-06-07 NOTE — Telephone Encounter (Signed)
Pt's wife informed of below.  

## 2018-06-07 NOTE — Telephone Encounter (Signed)
Copied from [place] [id number]. Topic: Quick Communication - See Telephone Encounter >> Jun 07, 2018 12:24 PM Rutherford Nail, NT wrote: CRM for notification. See Telephone encounter for: 06/07/18. Patient's wife calling and states that he was seen on 06/02/18 with Jodi Mourning and was told that if he did not feel better in a couple days that he could call back and she would send in an antibiotic. Please advise. [place], [place]

## 2018-08-21 ENCOUNTER — Telehealth: Payer: 59 | Admitting: Nurse Practitioner

## 2018-08-21 DIAGNOSIS — B9789 Other viral agents as the cause of diseases classified elsewhere: Secondary | ICD-10-CM | POA: Diagnosis not present

## 2018-08-21 DIAGNOSIS — J329 Chronic sinusitis, unspecified: Secondary | ICD-10-CM | POA: Diagnosis not present

## 2018-08-21 MED ORDER — FLUTICASONE PROPIONATE 50 MCG/ACT NA SUSP: 2.0000 | Freq: Every day | NASAL | 6 refills | Status: DC

## 2018-08-21 NOTE — Progress Notes (Signed)
We are sorry that you are not feeling well.  Here is how we plan to help!  Based on what you have shared with me it looks like you have sinusitis.  Sinusitis is inflammation and infection in the sinus cavities of the head.  Based on your presentation I believe you most likely have Acute Viral Sinusitis.This is an infection most likely caused by a virus. There is not specific treatment for viral sinusitis other than to help you with the symptoms until the infection runs its course.  You may use an oral decongestant such as Mucinex D or if you have glaucoma or high blood pressure use plain Mucinex. Saline nasal spray help and can safely be used as often as needed for congestion, I have prescribed: Fluticasone nasal spray two sprays in each nostril once a day  Some authorities believe that zinc sprays or the use of Echinacea may shorten the course of your symptoms.  Sinus infections are not as easily transmitted as other respiratory infection, however we still recommend that you avoid close contact with loved ones, especially the very young and elderly.  Remember to wash your hands thoroughly throughout the day as this is the number one way to prevent the spread of infection!  Home Care:  Only take medications as instructed by your medical team.  Do not take these medications with alcohol.  A steam or ultrasonic humidifier can help congestion.  You can place a towel over your head and breathe in the steam from hot water coming from a faucet.  Avoid close contacts especially the very young and the elderly.  Cover your mouth when you cough or sneeze.  Always remember to wash your hands.  Get Help Right Away If:  You develop worsening fever or sinus pain.  You develop a severe head ache or visual changes.  Your symptoms persist after you have completed your treatment plan.  Make sure you  Understand these instructions.  Will watch your condition.  Will get help right away if you are not  doing well or get worse.  Your e-visit answers were reviewed by a board certified advanced clinical practitioner to complete your personal care plan.  Depending on the condition, your plan could have included both over the counter or prescription medications.  If there is a problem please reply  once you have received a response from your provider.  Your safety is important to [place].  If you have drug allergies check your prescription carefully.    You can use MyChart to ask questions about today's visit, request a non-urgent call back, or ask for a work or school excuse for 24 hours related to this e-Visit. If it has been greater than 24 hours you will need to follow up with your provider, or enter a new e-Visit to address those concerns.  You will get an e-mail in the next two days asking about your experience.  I hope that your e-visit has been valuable and will speed your recovery. Thank you for using e-visits.  5 minutes spent reviewing and documenting in chart.

## 2019-08-15 ENCOUNTER — Telehealth: Payer: Self-pay | Admitting: Internal Medicine

## 2019-08-15 DIAGNOSIS — Z0000 Encounter for general adult medical examination without abnormal findings: Secondary | ICD-10-CM

## 2019-08-15 NOTE — Telephone Encounter (Signed)
Labs ordered.

## 2019-08-15 NOTE — Telephone Encounter (Signed)
    Requesting order for labs to be done at [facility] scheduled 03/10

## 2019-09-04 ENCOUNTER — Other Ambulatory Visit (INDEPENDENT_AMBULATORY_CARE_PROVIDER_SITE_OTHER): Payer: 59

## 2019-09-04 DIAGNOSIS — Z0000 Encounter for general adult medical examination without abnormal findings: Secondary | ICD-10-CM

## 2019-09-04 LAB — BASIC METABOLIC PANEL
BUN: 14 mg/dL (ref 6–23)
CO2: 28 mEq/L (ref 19–32)
Calcium: 9.6 mg/dL (ref 8.4–10.5)
Chloride: 101 mEq/L (ref 96–112)
Creatinine, Ser: 0.95 mg/dL (ref 0.40–1.50)
GFR: 86.04 mL/min (ref >60.00)
Glucose, Bld: 117 mg/dL — ABNORMAL HIGH (ref 70–99)
Potassium: 4.1 mEq/L (ref 3.5–5.1)
Sodium: 137 mEq/L (ref 135–145)

## 2019-09-04 LAB — CBC WITH DIFFERENTIAL/PLATELET
Basophils Absolute: 0.1 10*3/uL (ref 0.0–0.1)
Basophils Relative: 0.7 % (ref 0.0–3.0)
Eosinophils Absolute: 0.1 10*3/uL (ref 0.0–0.7)
Eosinophils Relative: 1.6 % (ref 0.0–5.0)
HCT: 44.9 % (ref 39.0–52.0)
Hemoglobin: 15.3 g/dL (ref 13.0–17.0)
Lymphocytes Relative: 45.1 % (ref 12.0–46.0)
Lymphs Abs: 3.8 10*3/uL (ref 0.7–4.0)
MCHC: 34.2 g/dL (ref 30.0–36.0)
MCV: 90.8 fl (ref 78.0–100.0)
Monocytes Absolute: 0.7 10*3/uL (ref 0.1–1.0)
Monocytes Relative: 8.3 % (ref 3.0–12.0)
Neutro Abs: 3.7 10*3/uL (ref 1.4–7.7)
Neutrophils Relative %: 44.3 % (ref 43.0–77.0)
Platelets: 278 10*3/uL (ref 150.0–400.0)
RBC: 4.94 Mil/uL (ref 4.22–5.81)
RDW: 13 % (ref 11.5–15.5)
WBC: 8.3 10*3/uL (ref 4.0–10.5)

## 2019-09-04 LAB — TSH: TSH: 1.71 u[IU]/mL (ref 0.35–4.50)

## 2019-09-04 LAB — LIPID PANEL
Cholesterol: 227 mg/dL — ABNORMAL HIGH (ref 0–200)
HDL: 47.8 mg/dL (ref >39.00)
LDL Cholesterol: 139 mg/dL — ABNORMAL HIGH (ref 0–99)
NonHDL: 178.79
Total CHOL/HDL Ratio: 5
Triglycerides: 200 mg/dL — ABNORMAL HIGH (ref 0.0–149.0)
VLDL: 40 mg/dL (ref 0.0–40.0)

## 2019-09-04 LAB — HEPATIC FUNCTION PANEL
ALT: 31 U/L (ref 0–53)
AST: 20 U/L (ref 0–37)
Albumin: 4.4 g/dL (ref 3.5–5.2)
Alkaline Phosphatase: 55 U/L (ref 39–117)
Bilirubin, Direct: 0.1 mg/dL (ref 0.0–0.3)
Total Bilirubin: 0.8 mg/dL (ref 0.2–1.2)
Total Protein: 7.3 g/dL (ref 6.0–8.3)

## 2019-09-04 LAB — URINALYSIS
Bilirubin Urine: NEGATIVE
Ketones, ur: NEGATIVE
Leukocytes,Ua: NEGATIVE
Nitrite: NEGATIVE
Specific Gravity, Urine: 1.025 (ref 1.000–1.030)
Total Protein, Urine: NEGATIVE
Urine Glucose: NEGATIVE
Urobilinogen, UA: 0.2 (ref 0.0–1.0)
pH: 5 (ref 5.0–8.0)

## 2019-09-04 LAB — PSA: PSA: 1.46 ng/mL (ref 0.10–4.00)

## 2019-09-05 ENCOUNTER — Ambulatory Visit (INDEPENDENT_AMBULATORY_CARE_PROVIDER_SITE_OTHER): Payer: 59 | Admitting: Internal Medicine

## 2019-09-05 ENCOUNTER — Other Ambulatory Visit: Payer: Self-pay

## 2019-09-05 ENCOUNTER — Encounter: Payer: Self-pay | Admitting: Internal Medicine

## 2019-09-05 DIAGNOSIS — Z0000 Encounter for general adult medical examination without abnormal findings: Secondary | ICD-10-CM

## 2019-09-05 DIAGNOSIS — F17200 Nicotine dependence, unspecified, uncomplicated: Secondary | ICD-10-CM

## 2019-09-05 NOTE — Progress Notes (Signed)
Subjective:  Patient ID: Dean Glass, male    DOB: 16-Dec-1975  Age: 44 y.o. MRN: [id number]  CC: No chief complaint on file.   HPI Dean Glass presents for a well exam C/o CP Quit smoking - gained wt   Outpatient Medications Prior to Visit  Medication Sig Dispense Refill  . azithromycin (ZITHROMAX) 250 MG tablet Take 2 tablets today then 1 tablet daily until complete 6 tablet 0  . fluticasone (FLONASE) 50 MCG/ACT nasal spray Place 2 sprays into both nostrils daily. 16 g 0  . fluticasone (FLONASE) 50 MCG/ACT nasal spray Place 2 sprays into both nostrils daily. 16 g 6  . loratadine (CLARITIN) 10 MG tablet Take 1 tablet (10 mg total) by mouth daily as needed for allergies. 100 tablet 3  . meloxicam (MOBIC) 15 MG tablet Take 1 tablet (15 mg total) by mouth daily. (Patient not taking: Reported on 06/02/2018) 30 tablet 1   No facility-administered medications prior to visit.    ROS: Review of Systems  Constitutional: Positive for unexpected weight change. Negative for appetite change and fatigue.  HENT: Negative for congestion, nosebleeds, sneezing, sore throat and trouble swallowing.   Eyes: Negative for itching and visual disturbance.  Respiratory: Negative for cough.   Cardiovascular: Negative for chest pain, palpitations and leg swelling.  Gastrointestinal: Negative for abdominal distention, blood in stool, diarrhea and nausea.  Genitourinary: Negative for frequency and hematuria.  Musculoskeletal: Negative for back pain, gait problem, joint swelling and neck pain.  Skin: Negative for rash.  Neurological: Negative for dizziness, tremors, speech difficulty and weakness.  Psychiatric/Behavioral: Negative for agitation, dysphoric mood, sleep disturbance and suicidal ideas. The patient is not nervous/anxious.     Objective:  BP 118/82 (BP Location: Left Arm, Patient Position: Sitting, Cuff Size: Normal)   Pulse (!) 108   Temp 98.7 F (37.1 C) (Oral)   Ht 5\' 7"   (1.702 m)   Wt 196 lb (88.9 kg)   SpO2 97%   BMI 30.70 kg/m   BP Readings from Last 3 Encounters:  09/05/19 118/82  06/02/18 120/76  05/22/18 124/80    Wt Readings from Last 3 Encounters:  09/05/19 196 lb (88.9 kg)  06/02/18 185 lb (83.9 kg)  05/22/18 188 lb (85.3 kg)    Physical Exam Constitutional:      General: He is not in acute distress.    Appearance: He is well-developed.     Comments: NAD  Eyes:     Conjunctiva/sclera: Conjunctivae normal.     Pupils: Pupils are equal, round, and reactive to light.  Neck:     Thyroid: No thyromegaly.     Vascular: No JVD.  Cardiovascular:     Rate and Rhythm: Normal rate and regular rhythm.     Heart sounds: Normal heart sounds. No murmur. No friction rub. No gallop.   Pulmonary:     Effort: Pulmonary effort is normal. No respiratory distress.     Breath sounds: Normal breath sounds. No wheezing or rales.  Chest:     Chest wall: No tenderness.  Abdominal:     General: Bowel sounds are normal. There is no distension.     Palpations: Abdomen is soft. There is no mass.     Tenderness: There is no abdominal tenderness. There is no guarding or rebound.  Musculoskeletal:        General: No tenderness. Normal range of motion.     Cervical back: Normal range of motion.  Lymphadenopathy:  Cervical: No cervical adenopathy.  Skin:    General: Skin is warm and dry.     Findings: No rash.  Neurological:     Mental Status: He is alert and oriented to person, place, and time.     Cranial Nerves: No cranial nerve deficit.     Motor: No abnormal muscle tone.     Coordination: Coordination normal.     Gait: Gait normal.     Deep Tendon Reflexes: Reflexes are normal and symmetric.  Psychiatric:        Behavior: Behavior normal.        Thought Content: Thought content normal.        Judgment: Judgment normal.   testes - self exam  Lab Results  Component Value Date   WBC 8.3 09/04/2019   HGB 15.3 09/04/2019   HCT 44.9  09/04/2019   PLT 278.0 09/04/2019   GLUCOSE 117 (H) 09/04/2019   CHOL 227 (H) 09/04/2019   TRIG 200.0 (H) 09/04/2019   HDL 47.80 09/04/2019   LDLCALC 139 (H) 09/04/2019   ALT 31 09/04/2019   AST 20 09/04/2019   NA 137 09/04/2019   K 4.1 09/04/2019   CL 101 09/04/2019   CREATININE 0.95 09/04/2019   BUN 14 09/04/2019   CO2 28 09/04/2019   TSH 1.71 09/04/2019   PSA 1.46 09/04/2019    No results found.  Assessment & Plan:    Walker Kehr, MD

## 2019-09-05 NOTE — Patient Instructions (Addendum)
Cardiac CT calcium scoring test $150 Tel # is [phone]   Computed tomography, more commonly known as a CT or CAT scan, is a diagnostic medical imaging test. Like traditional x-rays, it produces multiple images or pictures of the inside of the body. The cross-sectional images generated during a CT scan can be reformatted in multiple planes. They can even generate three-dimensional images. These images can be viewed on a computer monitor, printed on film or by a 3D printer, or transferred to a CD or DVD. CT images of internal organs, bones, soft tissue and blood vessels provide greater detail than traditional x-rays, particularly of soft tissues and blood vessels. A cardiac CT scan for coronary calcium is a non-invasive way of obtaining information about the presence, location and extent of calcified plaque in the coronary arteries--the vessels that supply oxygen-containing blood to the heart muscle. Calcified plaque results when there is a build-up of fat and other substances under the inner layer of the artery. This material can calcify which signals the presence of atherosclerosis, a disease of the vessel wall, also called coronary artery disease (CAD). People with this disease have an increased risk for heart attacks. In addition, over time, progression of plaque build up (CAD) can narrow the arteries or even close off blood flow to the heart. The result may be chest pain, sometimes called "angina," or a heart attack. Because calcium is a marker of CAD, the amount of calcium detected on a cardiac CT scan is a helpful prognostic tool. The findings on cardiac CT are expressed as a calcium score. Another name for this test is coronary artery calcium scoring.  What are some common uses of the procedure? The goal of cardiac CT scan for calcium scoring is to determine if CAD is present and to what extent, even if there are no symptoms. It is a screening study that may be recommended by a physician for  patients with risk factors for CAD but no clinical symptoms. The major risk factors for CAD are: . high blood cholesterol levels  . family history of heart attacks  . diabetes  . high blood pressure  . cigarette smoking  . overweight or obese  . physical inactivity   A negative cardiac CT scan for calcium scoring shows no calcification within the coronary arteries. This suggests that CAD is absent or so minimal it cannot be seen by this technique. The chance of having a heart attack over the next two to five years is very low under these circumstances. A positive test means that CAD is present, regardless of whether or not the patient is experiencing any symptoms. The amount of calcification--expressed as the calcium score--may help to predict the likelihood of a myocardial infarction (heart attack) in the coming years and helps your medical doctor or cardiologist decide whether the patient may need to take preventive medicine or undertake other measures such as diet and exercise to lower the risk for heart attack. The extent of CAD is graded according to your calcium score:  Calcium Score  Presence of CAD (coronary artery disease)  0 No evidence of CAD   1-10 Minimal evidence of CAD  11-100 Mild evidence of CAD  101-400 Moderate evidence of CAD  Over 400 Extensive evidence of CAD        Basic metabolic panel Order: [id number]  Status: Final result  Visible to patient: Yes (MyChart)  Next appt: None  Dx: Well adult exam    Ref Range & Units 1 d  ago   Sodium 135 - 145 mEq/L 137    Potassium 3.5 - 5.1 mEq/L 4.1    Chloride 96 - 112 mEq/L 101    CO2 19 - 32 mEq/L 28    Glucose, Bld 70 - 99 mg/dL 117   BUN 6 - 23 mg/dL 14    Creatinine, Ser 0.40 - 1.50 mg/dL 0.95    GFR >60.00 mL/min 86.04    Calcium 8.4 - 10.5 mg/dL 9.6    Resulting Agency  [place] [facility]       Specimen Collected: 09/04/19 08:27 Last Resulted: 09/04/19 10:38  Lab Flowsheet  Order Details  View Encounter   Lab and Collection Details  Routing  Result History     Other Results from 09/04/2019 Urinalysis  Status: Final result  Visible to patient: Yes (MyChart)  Next appt: None  Dx: Well adult exam  Order: [id number]    Ref Range & Units 1 d ago  Color, Urine Yellow;Lt. Yellow;Straw;Dark Yellow;Amber;Green;Red;Brown YELLOW   APPearance Clear;Turbid;Slightly Cloudy;Cloudy CLEAR   Specific Gravity, Urine 1.000 - 1.030 1.025   pH 5.0 - 8.0 5.0   Total Protein, Urine Negative NEGATIVE   Urine Glucose Negative NEGATIVE   Ketones, ur Negative NEGATIVE   Bilirubin Urine Negative NEGATIVE   Hgb urine dipstick Negative TRACE-INTACT  Urobilinogen, UA 0.0 - 1.0 0.2   Leukocytes,Ua Negative NEGATIVE   Nitrite Negative NEGATIVE   Resulting Agency  [facility]     Specimen Collected: 09/04/19 08:27 Last Resulted: 09/04/19 08:57  Lab Flowsheet  Order Details  View Encounter  Lab and Collection Details  Routing  Result History        TSH  Status: Final result  Visible to patient: Yes (MyChart)  Next appt: None  Dx: Well adult exam  Order: [id number]    Ref Range & Units 1 d ago  TSH 0.35 - 4.50 uIU/mL 1.71   Resulting Agency  [place] [facility]     Specimen Collected: 09/04/19 08:27 Last Resulted: 09/04/19 10:48  Lab Flowsheet  Order Details  View Encounter  Lab and Collection Details  Routing  Result History        PSA  Status: Final result  Visible to patient: Yes (MyChart)  Next appt: None  Dx: Well adult exam  Order: [id number]    Ref Range & Units 1 d ago  PSA 0.10 - 4.00 ng/mL 1.46   Comment: Test performed using Access Hybritech PSA Assay, a parmagnetic partical, chemiluminecent immunoassay.  Resulting Agency  [place] [facility]     Specimen Collected: 09/04/19 08:27 Last Resulted: 09/04/19 10:47  Lab Flowsheet  Order Details  View Encounter  Lab and Collection Details  Routing  Result History        Lipid panel  Status: Final result  Visible to  patient: Yes (MyChart)  Next appt: None  Dx: Well adult exam  Order: [id number]    Ref Range & Units 1 d ago  Cholesterol 0 - 200 mg/dL 227  Comment: ATP III Classification Desirable: < 200 mg/dL Borderline High: 200 - 239 mg/dL High: > = 240 mg/dL  Triglycerides 0.0 - 149.0 mg/dL 200.0  Comment: Normal: <150 mg/dLBorderline High: 150 - 199 mg/dL  HDL >39.00 mg/dL 47.80   VLDL 0.0 - 40.0 mg/dL 40.0   LDL Cholesterol 0 - 99 mg/dL 139  Total CHOL/HDL Ratio  5   Comment: Men Women1/2 Average Risk 3.4 3.3Average Risk 5.0 4.42X Average Risk 9.6 7.13X Average Risk 15.0 11.0   NonHDL  178.79   Comment: NOTE: Non-HDL goal should be 30 mg/dL higher than patient's LDL goal (i.e. LDL goal of < 70 mg/dL, would have non-HDL goal of < 100 mg/dL)  Resulting Agency  [place] [facility]     Specimen Collected: 09/04/19 08:27 Last Resulted: 09/04/19 10:38  Lab Flowsheet  Order Details  View Encounter  Lab and Collection Details  Routing  Result History        Hepatic function panel  Status: Final result  Visible to patient: Yes (MyChart)  Next appt: None  Dx: Well adult exam  Order: [id number]    Ref Range & Units 1 d ago  Total Bilirubin 0.2 - 1.2 mg/dL 0.8   Bilirubin, Direct 0.0 - 0.3 mg/dL 0.1   Alkaline Phosphatase 39 - 117 U/L 55   AST 0 - 37 U/L 20   ALT 0 - 53 U/L 31   Total Protein 6.0 - 8.3 g/dL 7.3   Albumin 3.5 - 5.2 g/dL 4.4   Resulting Agency  [place] [facility]     Specimen Collected: 09/04/19 08:27 Last Resulted: 09/04/19 10:38  Lab Flowsheet  Order Details  View Encounter  Lab and Collection Details  Routing  Result History        CBC with Differential/Platelet  Status: Final result  Visible to patient: Yes (MyChart)  Next appt: None  Dx: Well adult exam  Order: [id number]    Ref Range & Units 1 d ago  WBC 4.0 - 10.5 K/uL 8.3   RBC 4.22 - 5.81 Mil/uL 4.94   Hemoglobin 13.0 - 17.0 g/dL 15.3   HCT 39.0 - 52.0 % 44.9   MCV 78.0 - 100.0 fl 90.8   MCHC 30.0 -  36.0 g/dL 34.2   RDW 11.5 - 15.5 % 13.0   Platelets 150.0 - 400.0 K/uL 278.0   Neutrophils Relative % 43.0 - 77.0 % 44.3   Lymphocytes Relative 12.0 - 46.0 % 45.1   Monocytes Relative 3.0 - 12.0 % 8.3   Eosinophils Relative 0.0 - 5.0 % 1.6   Basophils Relative 0.0 - 3.0 % 0.7   Neutro Abs 1.4 - 7.7 K/uL 3.7   Lymphs Abs 0.7 - 4.0 K/uL 3.8   Monocytes Absolute 0.1 - 1.0 K/uL 0.7   Eosinophils Absolute 0.0 - 0.7 K/uL 0.1   Basophils Absolute 0.0 - 0.1 K/uL 0.1   Resulting Agency  [place] [facility]     Specimen Collected: 09/04/19 08:27 Last Resulted: 09/04/19 09:33  Lab Flowsheet  Order Details  View Encounter  Lab and Collection Details  Routing  Result History        MyChart Results Release MyChart Status: Active Results Release                       All Reviewers List Cassandria Anger, MD on 09/04/2019 13:05      Encounter      View Encounter Result Information   Flag: Abnormal Status: Final result (Collected: 09/04/2019 08:27) Provider Status: Reviewed     Lab Information [facility] [facility]                         Order-Level Documents: There are no order-level documents.    View SmartLink Info Basic metabolic panel (Order [id number]) on 09/04/19     Result Read / Acknowledged User Time Read / Acknowledged  Yulisa Chirico, Evie Lacks, MD 09/04/2019 1:05 PM

## 2019-09-06 ENCOUNTER — Encounter: Payer: Self-pay | Admitting: Internal Medicine

## 2019-09-06 NOTE — Assessment & Plan Note (Signed)
  We discussed age appropriate health related issues, including available/recomended screening tests and vaccinations. Labs were ordered to be later reviewed . All questions were answered. We discussed one or more of the following - seat belt use, use of sunscreen/sun exposure exercise, safe sex, fall risk reduction, second hand smoke exposure, firearm use and storage, seat belt use, a need for adhering to healthy diet and exercise. Labs were ordered or discussed if they are available. All questions were answered. Loose wt Cardiac coronary calcium CT offered Tesicular self exam

## 2019-09-06 NOTE — Assessment & Plan Note (Signed)
Stopped

## 2019-09-07 NOTE — Addendum Note (Signed)
Addended by: Karren Cobble on: 09/07/2019 01:30 PM   Modules accepted: Orders

## 2019-09-18 ENCOUNTER — Other Ambulatory Visit: Payer: Self-pay

## 2019-09-18 ENCOUNTER — Ambulatory Visit (INDEPENDENT_AMBULATORY_CARE_PROVIDER_SITE_OTHER)
Admission: RE | Admit: 2019-09-18 | Discharge: 2019-09-18 | Disposition: A | Discharge status: Home / Self Care | Payer: Self-pay | Source: Ambulatory Visit | Attending: Internal Medicine | Admitting: Internal Medicine

## 2019-09-18 DIAGNOSIS — F17200 Nicotine dependence, unspecified, uncomplicated: Secondary | ICD-10-CM

## 2019-09-21 ENCOUNTER — Other Ambulatory Visit: Payer: Self-pay | Admitting: Internal Medicine

## 2019-09-21 DIAGNOSIS — I2510 Atherosclerotic heart disease of native coronary artery without angina pectoris: Secondary | ICD-10-CM | POA: Insufficient documentation

## 2019-09-21 MED ORDER — ASPIRIN EC 81 MG PO TBEC: 81.0000 mg | DELAYED_RELEASE_TABLET | Freq: Every day | ORAL | 3 refills | Status: AC

## 2019-09-21 MED ORDER — VITAMIN D3 50 MCG (2000 UT) PO CAPS: 2000.0000 [IU] | ORAL_CAPSULE | Freq: Every day | ORAL | 3 refills | Status: DC

## 2019-09-22 ENCOUNTER — Other Ambulatory Visit: Payer: Self-pay | Admitting: Internal Medicine

## 2019-09-22 DIAGNOSIS — I2510 Atherosclerotic heart disease of native coronary artery without angina pectoris: Secondary | ICD-10-CM

## 2019-09-22 DIAGNOSIS — E785 Hyperlipidemia, unspecified: Secondary | ICD-10-CM | POA: Insufficient documentation

## 2019-09-22 MED ORDER — ATORVASTATIN CALCIUM 10 MG PO TABS: 10.0000 mg | ORAL_TABLET | Freq: Every day | ORAL | 3 refills | Status: DC

## 2019-12-12 ENCOUNTER — Telehealth: Payer: Self-pay | Admitting: Internal Medicine

## 2019-12-12 DIAGNOSIS — E785 Hyperlipidemia, unspecified: Secondary | ICD-10-CM

## 2019-12-12 NOTE — Telephone Encounter (Signed)
New message:   Pt is requesting that orders be placed for labs before his appt on 12/17/19. Please advise.

## 2019-12-14 ENCOUNTER — Other Ambulatory Visit (INDEPENDENT_AMBULATORY_CARE_PROVIDER_SITE_OTHER): Payer: 59

## 2019-12-14 DIAGNOSIS — E785 Hyperlipidemia, unspecified: Secondary | ICD-10-CM | POA: Diagnosis not present

## 2019-12-14 LAB — HEPATIC FUNCTION PANEL
ALT: 42 U/L (ref 0–53)
AST: 23 U/L (ref 0–37)
Albumin: 4.6 g/dL (ref 3.5–5.2)
Alkaline Phosphatase: 60 U/L (ref 39–117)
Bilirubin, Direct: 0.2 mg/dL (ref 0.0–0.3)
Total Bilirubin: 0.7 mg/dL (ref 0.2–1.2)
Total Protein: 7.3 g/dL (ref 6.0–8.3)

## 2019-12-14 LAB — LIPID PANEL
Cholesterol: 149 mg/dL (ref 0–200)
HDL: 41.5 mg/dL (ref >39.00)
LDL Cholesterol: 87 mg/dL (ref 0–99)
NonHDL: 107.59
Total CHOL/HDL Ratio: 4
Triglycerides: 101 mg/dL (ref 0.0–149.0)
VLDL: 20.2 mg/dL (ref 0.0–40.0)

## 2019-12-14 NOTE — Telephone Encounter (Signed)
Done. Thanks.

## 2019-12-14 NOTE — Telephone Encounter (Signed)
Left detailed message informing pt of below.  

## 2019-12-17 ENCOUNTER — Ambulatory Visit (INDEPENDENT_AMBULATORY_CARE_PROVIDER_SITE_OTHER): Payer: 59 | Admitting: Internal Medicine

## 2019-12-17 ENCOUNTER — Encounter: Payer: Self-pay | Admitting: Internal Medicine

## 2019-12-17 ENCOUNTER — Other Ambulatory Visit: Payer: Self-pay

## 2019-12-17 DIAGNOSIS — I2510 Atherosclerotic heart disease of native coronary artery without angina pectoris: Secondary | ICD-10-CM

## 2019-12-17 DIAGNOSIS — I2583 Coronary atherosclerosis due to lipid rich plaque: Secondary | ICD-10-CM | POA: Diagnosis not present

## 2019-12-17 DIAGNOSIS — L237 Allergic contact dermatitis due to plants, except food: Secondary | ICD-10-CM

## 2019-12-17 DIAGNOSIS — E785 Hyperlipidemia, unspecified: Secondary | ICD-10-CM | POA: Diagnosis not present

## 2019-12-17 DIAGNOSIS — L259 Unspecified contact dermatitis, unspecified cause: Secondary | ICD-10-CM | POA: Insufficient documentation

## 2019-12-17 MED ORDER — METHYLPREDNISOLONE 4 MG PO TBPK: ORAL_TABLET | ORAL | 1 refills | Status: DC

## 2019-12-17 MED ORDER — TRIAMCINOLONE ACETONIDE 0.5 % EX CREA: 1.0000 "application " | TOPICAL_CREAM | Freq: Three times a day (TID) | CUTANEOUS | 1 refills | Status: DC

## 2019-12-17 MED ORDER — ATORVASTATIN CALCIUM 10 MG PO TABS: 10.0000 mg | ORAL_TABLET | Freq: Every day | ORAL | 3 refills | Status: DC

## 2019-12-17 NOTE — Assessment & Plan Note (Signed)
poison ivy - severe Medro lpack Triamc cream

## 2019-12-17 NOTE — Assessment & Plan Note (Signed)
Lipitor 

## 2019-12-17 NOTE — Progress Notes (Signed)
Subjective:  Patient ID: Dean Glass, male    DOB: 06-03-1976  Age: 44 y.o. MRN: [id number]  CC: No chief complaint on file.   HPI Dean Glass presents for dyslipidemia f/u - on Lipitor C/o itchy rash x 2 d  Outpatient Medications Prior to Visit  Medication Sig Dispense Refill  . aspirin EC 81 MG tablet Take 1 tablet (81 mg total) by mouth daily. 100 tablet 3  . atorvastatin (LIPITOR) 10 MG tablet Take 1 tablet (10 mg total) by mouth daily. 90 tablet 3  . Cholecalciferol (VITAMIN D3) 50 MCG (2000 UT) capsule Take 1 capsule (2,000 Units total) by mouth daily. 100 capsule 3   No facility-administered medications prior to visit.    ROS: Review of Systems  Constitutional: Negative for appetite change, fatigue and unexpected weight change.  HENT: Negative for congestion, nosebleeds, sneezing, sore throat and trouble swallowing.   Eyes: Negative for itching and visual disturbance.  Respiratory: Negative for cough.   Cardiovascular: Negative for chest pain, palpitations and leg swelling.  Gastrointestinal: Negative for abdominal distention, blood in stool, diarrhea and nausea.  Genitourinary: Negative for frequency and hematuria.  Musculoskeletal: Negative for back pain, gait problem, joint swelling and neck pain.  Skin: Positive for rash.  Neurological: Negative for dizziness, tremors, speech difficulty and weakness.  Psychiatric/Behavioral: Negative for agitation, dysphoric mood and sleep disturbance. The patient is not nervous/anxious.     Objective:  BP 118/80 (BP Location: Right Arm, Patient Position: Sitting, Cuff Size: Large)   Pulse 77   Temp 98 F (36.7 C) (Oral)   Ht 5\' 7"  (1.702 m)   Wt 200 lb (90.7 kg)   SpO2 98%   BMI 31.32 kg/m   BP Readings from Last 3 Encounters:  12/17/19 118/80  09/05/19 118/82  06/02/18 120/76    Wt Readings from Last 3 Encounters:  12/17/19 200 lb (90.7 kg)  09/05/19 196 lb (88.9 kg)  06/02/18 185 lb (83.9 kg)     Physical Exam Constitutional:      General: He is not in acute distress.    Appearance: He is well-developed.     Comments: NAD  Eyes:     Conjunctiva/sclera: Conjunctivae normal.     Pupils: Pupils are equal, round, and reactive to light.  Neck:     Thyroid: No thyromegaly.     Vascular: No JVD.  Cardiovascular:     Rate and Rhythm: Normal rate and regular rhythm.     Heart sounds: Normal heart sounds. No murmur heard.  No friction rub. No gallop.   Pulmonary:     Effort: Pulmonary effort is normal. No respiratory distress.     Breath sounds: Normal breath sounds. No wheezing or rales.  Chest:     Chest wall: No tenderness.  Abdominal:     General: Bowel sounds are normal. There is no distension.     Palpations: Abdomen is soft. There is no mass.     Tenderness: There is no abdominal tenderness. There is no guarding or rebound.  Musculoskeletal:        General: No tenderness. Normal range of motion.     Cervical back: Normal range of motion.  Lymphadenopathy:     Cervical: No cervical adenopathy.  Skin:    General: Skin is warm and dry.  Neurological:     Mental Status: He is alert and oriented to person, place, and time.     Cranial Nerves: No cranial nerve deficit.  Motor: No abnormal muscle tone.     Coordination: Coordination normal.     Gait: Gait normal.     Deep Tendon Reflexes: Reflexes are normal and symmetric.  Psychiatric:        Behavior: Behavior normal.        Thought Content: Thought content normal.        Judgment: Judgment normal.    Rash on legs, arms - vesicles, papules w/d/c   Lab Results  Component Value Date   WBC 8.3 09/04/2019   HGB 15.3 09/04/2019   HCT 44.9 09/04/2019   PLT 278.0 09/04/2019   GLUCOSE 117 (H) 09/04/2019   CHOL 149 12/14/2019   TRIG 101.0 12/14/2019   HDL 41.50 12/14/2019   LDLCALC 87 12/14/2019   ALT 42 12/14/2019   AST 23 12/14/2019   NA 137 09/04/2019   K 4.1 09/04/2019   CL 101 09/04/2019    CREATININE 0.95 09/04/2019   BUN 14 09/04/2019   CO2 28 09/04/2019   TSH 1.71 09/04/2019   PSA 1.46 09/04/2019    CT CARDIAC SCORING  Addendum Date: 09/19/2019   ADDENDUM REPORT: 09/19/2019 13:55 CLINICAL DATA:  Risk stratification EXAM: Coronary Calcium Score TECHNIQUE: The patient was scanned on a [employer] scanner. Axial non-contrast 3 mm slices were carried out through the heart. The data set was analyzed on a dedicated work station and scored using the [place]. FINDINGS: Non-cardiac: See separate report from [facility]. Ascending Aorta: Normal size, measuring 30 mm at the mid ascending aorta, measured double oblique at PA bifurcation. No significant calcification. Pericardium: Normal Coronary arteries: Arise from normal coronary cusps. IMPRESSION: Coronary calcium score of 2. This was out of the range for comparable matched controls (starts at age 44). Bridgette Christopher Electronically Signed   By: Buford Dresser M.D.   On: 09/19/2019 13:55   Result Date: 09/19/2019 EXAM: OVER-READ INTERPRETATION  CT CHEST The following report is an over-read performed by radiologist Dr. Vinnie Langton of [facility], [place] on 09/18/2019. This over-read does not include interpretation of cardiac or coronary anatomy or pathology. The coronary calcium score interpretation by the cardiologist is attached. COMPARISON:  None. FINDINGS: Within the visualized portions of the thorax there are no suspicious appearing pulmonary nodules or masses, there is no acute consolidative airspace disease, no pleural effusions, no pneumothorax and no lymphadenopathy. Visualized portions of the upper abdomen are unremarkable. There are no aggressive appearing lytic or blastic lesions noted in the visualized portions of the skeleton. IMPRESSION: No significant incidental noncardiac findings are noted. Electronically Signed: By: Vinnie Langton M.D. On: 09/18/2019 16:43    Assessment & Plan:    Diagnoses and all orders for this visit:  Coronary atherosclerosis due to lipid rich plaque     No orders of the defined types were placed in this encounter.    Follow-up: No follow-ups on file.  Walker Kehr, MD

## 2020-01-03 ENCOUNTER — Other Ambulatory Visit: Payer: Self-pay | Admitting: Internal Medicine

## 2020-01-03 MED ORDER — MELOXICAM 15 MG PO TABS: 15.0000 mg | ORAL_TABLET | Freq: Every day | ORAL | 1 refills | Status: DC

## 2020-03-11 NOTE — Progress Notes (Signed)
Subjective:    Patient ID: Dean Glass, male    DOB: Mar 14, 1976, 44 y.o.   MRN: [id number]  HPI The patient is here for an acute visit.  Left side pain -  Saturday morning he had lower abdominal pain and diarrhea.   Early Sunday morning he got a phone call for work and sat up quickly in bed and at that time had a pain in his left side I did state with him all day Sunday and Monday.  He could feel it if he took deep breaths made certain movements.  It is better today and actually he thinks it is gone.  He did sit up quick and turned to get the phone and wondered if he pulled something.  His stomach symptoms resolved and he no longer has those.  He currently has no pain, but just wanted to have this checked.  He has had a kidney stone in the past.  He denies any urinary symptoms.      Medications and allergies reviewed with patient and updated if appropriate.  Patient Active Problem List   Diagnosis Date Noted  . Contact dermatitis 12/17/2019  . Dyslipidemia 09/22/2019  . Coronary atherosclerosis 09/21/2019  . Tennis elbow 05/22/2018  . Upper respiratory tract infection 04/11/2017  . Actinic keratoses 03/18/2017  . Well adult exam 05/01/2015  . Toothache 05/01/2015  . NEOPLASM OF UNCERTAIN BEHAVIOR OF SKIN 03/31/2010  . Allergic rhinitis due to other allergen 10/15/2009  . TOBACCO USER 07/25/2009    Current Outpatient Medications on File Prior to Visit  Medication Sig Dispense Refill  . aspirin EC 81 MG tablet Take 1 tablet (81 mg total) by mouth daily. 100 tablet 3  . atorvastatin (LIPITOR) 10 MG tablet Take 1 tablet (10 mg total) by mouth daily. 90 tablet 3  . Cholecalciferol (VITAMIN D3) 50 MCG (2000 UT) capsule Take 1 capsule (2,000 Units total) by mouth daily. 100 capsule 3  . meloxicam (MOBIC) 15 MG tablet Take 1 tablet (15 mg total) by mouth daily. 30 tablet 1   No current facility-administered medications on file prior to visit.    Past Medical History:   Diagnosis Date  . GERD (gastroesophageal reflux disease)   . Seasonal allergies     Past Surgical History:  Procedure Laterality Date  . VASECTOMY      Social History   Socioeconomic History  . Marital status: Married    Spouse name: Not on file  . Number of children: Not on file  . Years of education: Not on file  . Highest education level: Not on file  Occupational History  . Not on file  Tobacco Use  . Smoking status: Current Every Day Smoker    Packs/day: 0.50    Types: Cigarettes    Last attempt to quit: 03/13/2015    Years since quitting: 5.0  . Smokeless tobacco: Former Network engineer and Sexual Activity  . Alcohol use: Yes    Alcohol/week: 0.0 standard drinks    Comment: Social  . Drug use: No  . Sexual activity: Yes  Other Topics Concern  . Not on file  Social History Narrative  . Not on file   Social Determinants of Health   Financial Resource Strain:   . Difficulty of Paying Living Expenses: Not on file  Food Insecurity:   . Worried About Charity fundraiser in the Last Year: Not on file  . Ran Out of Food in the Last Year: Not  on file  Transportation Needs:   . Lack of Transportation (Medical): Not on file  . Lack of Transportation (Non-Medical): Not on file  Physical Activity:   . Days of Exercise per Week: Not on file  . Minutes of Exercise per Session: Not on file  Stress:   . Feeling of Stress : Not on file  Social Connections:   . Frequency of Communication with Friends and Family: Not on file  . Frequency of Social Gatherings with Friends and Family: Not on file  . Attends Religious Services: Not on file  . Active Member of Clubs or Organizations: Not on file  . Attends Archivist Meetings: Not on file  . Marital Status: Not on file    Family History  Problem Relation Age of Onset  . Diabetes Mother   . Coronary artery disease Father   . Heart disease Father        cad  . Early death Brother 77       cystic kidney     Review of Systems  Constitutional: Negative for chills and fever.  Gastrointestinal: Positive for abdominal pain (LLQ sharp pain) and diarrhea (resolved). Negative for nausea.  Musculoskeletal: Negative for back pain.  Neurological: Negative for numbness.       Objective:   Vitals:   03/12/20 0912  BP: 120/82  Pulse: 91  Temp: 97.8 F (36.6 C)  SpO2: 97%   BP Readings from Last 3 Encounters:  03/12/20 120/82  12/17/19 118/80  09/05/19 118/82   Wt Readings from Last 3 Encounters:  03/12/20 205 lb 6.4 oz (93.2 kg)  12/17/19 200 lb (90.7 kg)  09/05/19 196 lb (88.9 kg)   Body mass index is 32.17 kg/m.   Physical Exam Constitutional:      General: He is not in acute distress.    Appearance: Normal appearance. He is not ill-appearing.  HENT:     Head: Normocephalic.  Abdominal:     General: There is no distension.     Palpations: Abdomen is soft.     Tenderness: There is no abdominal tenderness. There is no right CVA tenderness, left CVA tenderness, guarding or rebound.     Hernia: No hernia is present.  Musculoskeletal:        General: No tenderness (Mid or lower back).  Skin:    General: Skin is warm and dry.  Neurological:     Mental Status: He is alert.            Assessment & Plan:    See Problem List for Assessment and Plan of chronic medical problems.    This visit occurred during the SARS-CoV-2 public health emergency.  Safety protocols were in place, including screening questions prior to the visit, additional usage of staff PPE, and extensive cleaning of exam room while observing appropriate contact time as indicated for disinfecting solutions.

## 2020-03-12 ENCOUNTER — Ambulatory Visit (INDEPENDENT_AMBULATORY_CARE_PROVIDER_SITE_OTHER): Payer: 59 | Admitting: Internal Medicine

## 2020-03-12 ENCOUNTER — Other Ambulatory Visit: Payer: Self-pay

## 2020-03-12 ENCOUNTER — Encounter: Payer: Self-pay | Admitting: Internal Medicine

## 2020-03-12 DIAGNOSIS — R109 Unspecified abdominal pain: Secondary | ICD-10-CM | POA: Diagnosis not present

## 2020-03-12 NOTE — Assessment & Plan Note (Signed)
Acute Episode of left flank/left mid quadrant pain Likely musculoskeletal in nature-occurred after sitting up quickly and twisting No urinary symptoms to suggest kidney stone, but has a history of 1 Pain has resolved At this point we will just monitor If pain recurs or he has any concerning symptoms he will let us know and can consider further evaluation at that time

## 2020-03-12 NOTE — Patient Instructions (Signed)
If your symptoms recur let us know.

## 2020-04-21 ENCOUNTER — Other Ambulatory Visit: Payer: Self-pay | Admitting: Internal Medicine

## 2020-05-08 ENCOUNTER — Other Ambulatory Visit: Payer: Self-pay | Admitting: Physician Assistant

## 2020-05-08 ENCOUNTER — Telehealth: Payer: 59 | Admitting: Physician Assistant

## 2020-05-08 DIAGNOSIS — B9789 Other viral agents as the cause of diseases classified elsewhere: Secondary | ICD-10-CM | POA: Diagnosis not present

## 2020-05-08 DIAGNOSIS — J019 Acute sinusitis, unspecified: Secondary | ICD-10-CM | POA: Diagnosis not present

## 2020-05-08 MED ORDER — FLUTICASONE PROPIONATE 50 MCG/ACT NA SUSP: 2.0000 | Freq: Every day | NASAL | 0 refills | Status: DC

## 2020-05-08 NOTE — Progress Notes (Signed)

## 2020-05-13 ENCOUNTER — Other Ambulatory Visit: Payer: Self-pay | Admitting: Internal Medicine

## 2020-05-13 MED ORDER — AZITHROMYCIN 250 MG PO TABS: ORAL_TABLET | ORAL | 0 refills | Status: DC

## 2020-06-26 ENCOUNTER — Telehealth (INDEPENDENT_AMBULATORY_CARE_PROVIDER_SITE_OTHER): Payer: 59 | Admitting: Family Medicine

## 2020-06-26 ENCOUNTER — Encounter: Payer: Self-pay | Admitting: Family Medicine

## 2020-06-26 VITALS — Temp 98.0°F

## 2020-06-26 DIAGNOSIS — J069 Acute upper respiratory infection, unspecified: Secondary | ICD-10-CM | POA: Diagnosis not present

## 2020-06-26 NOTE — Progress Notes (Signed)
Virtual Visit via Video Note  I connected with pt on 06/26/20 at  1:00 PM EST by telephone b/c the video enabled telemedicine application malfunctioned and was not able to be fixed) and verified that I am speaking with the correct person using two identifiers.  Location patient: home, [place] Location provider:work or home office Persons participating in the virtual visit: patient, provider  I discussed the limitations of evaluation and management by telemedicine and the availability of in person appointments. The patient expressed understanding and agreed to proceed.  Telemedicine visit is a necessity given the COVID-19 restrictions in place at the current time.  HPI: 44 y/o male being seen today for "low grade fever and congestion". Onset 3 nights ago, fatigued, Tm 100.5, has some mild nasal congestion with PND. Minimal cough.  No sob or wheezing. Eating and drinking fine.  At-home covid tests yesterday and today NEG.   ROS: See pertinent positives and negatives per HPI.  Past Medical History:  Diagnosis Date  . GERD (gastroesophageal reflux disease)   . Seasonal allergies     Past Surgical History:  Procedure Laterality Date  . VASECTOMY       Current Outpatient Medications:  .  aspirin EC 81 MG tablet, Take 1 tablet (81 mg total) by mouth daily., Disp: 100 tablet, Rfl: 3 .  atorvastatin (LIPITOR) 10 MG tablet, Take 1 tablet (10 mg total) by mouth daily., Disp: 90 tablet, Rfl: 3 .  Cholecalciferol (VITAMIN D3) 50 MCG (2000 UT) capsule, Take 1 capsule (2,000 Units total) by mouth daily., Disp: 100 capsule, Rfl: 3 .  fluticasone (FLONASE) 50 MCG/ACT nasal spray, Place 2 sprays into both nostrils daily., Disp: 16 g, Rfl: 0 .  meloxicam (MOBIC) 15 MG tablet, TAKE 1 TABLET BY MOUTH DAILY (Patient not taking: Reported on 06/26/2020), Disp: 30 tablet, Rfl: 2  EXAM:  VITALS per patient if applicable:  Vitals with BMI 03/12/2020 12/17/2019 09/05/2019  Height - 5\' 7"  5\' 7"   Weight 205  lbs 6 oz 200 lbs 196 lbs  BMI - 31.32 30.69  Systolic 120 118  Diastolic 82 80 82  Pulse 91 77 108     GENERAL: alert, oriented, sounded well and in no acute distress No further exam b/c audio visit only.    LABS: none today    Chemistry      Component Value Date/Time   NA 137 09/04/2019 0827   K 4.1 09/04/2019 0827   CL 101 09/04/2019 0827   CO2 28 09/04/2019 0827   BUN 14 09/04/2019 0827   CREATININE 0.95 09/04/2019 0827      Component Value Date/Time   CALCIUM 9.6 09/04/2019 0827   ALKPHOS 60 12/14/2019 0832   AST 23 12/14/2019 0832   ALT 42 12/14/2019 0832   BILITOT 0.7 12/14/2019 0832      ASSESSMENT AND PLAN:  Discussed the following assessment and plan:  Viral URI, minimal cough. Low grade fever now resolved. Covid testing neg. Discussed symptomatic care, rest, fluids. Signs/symptoms to call or return for were reviewed and pt expressed understanding.   I discussed the assessment and treatment plan with the patient. The patient was provided an opportunity to ask questions and all were answered. The patient agreed with the plan and demonstrated an understanding of the instructions.  F/u: if not improving   Signed:  12/16/2019, MD           06/26/2020

## 2020-09-12 ENCOUNTER — Other Ambulatory Visit (INDEPENDENT_AMBULATORY_CARE_PROVIDER_SITE_OTHER): Payer: 59

## 2020-09-12 DIAGNOSIS — E785 Hyperlipidemia, unspecified: Secondary | ICD-10-CM

## 2020-09-12 LAB — HEPATIC FUNCTION PANEL
ALT: 36 U/L (ref 0–53)
AST: 19 U/L (ref 0–37)
Albumin: 4 g/dL (ref 3.5–5.2)
Alkaline Phosphatase: 54 U/L (ref 39–117)
Bilirubin, Direct: 0.1 mg/dL (ref 0.0–0.3)
Total Bilirubin: 0.7 mg/dL (ref 0.2–1.2)
Total Protein: 6.7 g/dL (ref 6.0–8.3)

## 2020-09-12 LAB — LIPID PANEL
Cholesterol: 121 mg/dL (ref 0–200)
HDL: 38.7 mg/dL — ABNORMAL LOW (ref >39.00)
LDL Cholesterol: 57 mg/dL (ref 0–99)
NonHDL: 81.86
Total CHOL/HDL Ratio: 3
Triglycerides: 126 mg/dL (ref 0.0–149.0)
VLDL: 25.2 mg/dL (ref 0.0–40.0)

## 2020-09-12 LAB — BASIC METABOLIC PANEL
BUN: 9 mg/dL (ref 6–23)
CO2: 25 mEq/L (ref 19–32)
Calcium: 8.9 mg/dL (ref 8.4–10.5)
Chloride: 105 mEq/L (ref 96–112)
Creatinine, Ser: 0.88 mg/dL (ref 0.40–1.50)
GFR: 104.04 mL/min (ref >60.00)
Glucose, Bld: 124 mg/dL — ABNORMAL HIGH (ref 70–99)
Potassium: 3.9 mEq/L (ref 3.5–5.1)
Sodium: 139 mEq/L (ref 135–145)

## 2020-09-15 ENCOUNTER — Other Ambulatory Visit: Payer: Self-pay | Admitting: Internal Medicine

## 2020-09-15 ENCOUNTER — Other Ambulatory Visit: Payer: Self-pay

## 2020-09-15 ENCOUNTER — Ambulatory Visit (INDEPENDENT_AMBULATORY_CARE_PROVIDER_SITE_OTHER): Payer: 59 | Admitting: Internal Medicine

## 2020-09-15 ENCOUNTER — Encounter: Payer: Self-pay | Admitting: Internal Medicine

## 2020-09-15 VITALS — BP 126/72 | HR 92 | Temp 99.0°F | Ht 67.0 in | Wt 214.0 lb

## 2020-09-15 DIAGNOSIS — Z0000 Encounter for general adult medical examination without abnormal findings: Secondary | ICD-10-CM | POA: Diagnosis not present

## 2020-09-15 DIAGNOSIS — E785 Hyperlipidemia, unspecified: Secondary | ICD-10-CM | POA: Diagnosis not present

## 2020-09-15 DIAGNOSIS — F17200 Nicotine dependence, unspecified, uncomplicated: Secondary | ICD-10-CM

## 2020-09-15 DIAGNOSIS — R739 Hyperglycemia, unspecified: Secondary | ICD-10-CM | POA: Insufficient documentation

## 2020-09-15 MED ORDER — MELOXICAM 15 MG PO TABS: 15.0000 mg | ORAL_TABLET | Freq: Every day | ORAL | 2 refills | Status: DC

## 2020-09-15 MED ORDER — ATORVASTATIN CALCIUM 10 MG PO TABS: 10.0000 mg | ORAL_TABLET | Freq: Every day | ORAL | 3 refills | Status: DC

## 2020-09-15 MED ORDER — ATORVASTATIN CALCIUM 10 MG PO TABS
10.0000 mg | ORAL_TABLET | Freq: Every day | ORAL | 3 refills | Status: DC
Start: 2020-09-15 — End: 2020-09-15

## 2020-09-15 MED ORDER — MELOXICAM 15 MG PO TABS
15.0000 mg | ORAL_TABLET | Freq: Every day | ORAL | 2 refills | Status: DC
Start: 2020-09-15 — End: 2020-09-15

## 2020-09-15 NOTE — Patient Instructions (Signed)
Cologuard

## 2020-09-15 NOTE — Assessment & Plan Note (Signed)
Chronic - stopped in 2020

## 2020-09-15 NOTE — Assessment & Plan Note (Addendum)
  We discussed age appropriate health related issues, including available/recomended screening tests and vaccinations. Labs were ordered to be later reviewed . All questions were answered. We discussed one or more of the following - seat belt use, use of sunscreen/sun exposure exercise, safe sex, fall risk reduction, second hand smoke exposure, firearm use and storage, seat belt use, a need for adhering to healthy diet and exercise. Labs were ordered.  All questions were answered.  New recommendations for colon cancer screening were discussed.  He will check with his insurance if they cover screening tests after 45 years of age yet. Cologuard   Dean Glass has gained weight after he stopped smoking.  He drinks up to 5 regular Cokes a day.  We discussed the risk of diabetes.  He needs to lose weight.  He will try to stop drinking Cokes

## 2020-09-15 NOTE — Assessment & Plan Note (Addendum)
Check A1c Gained weight after he stopped smoking.  He drinks up to 5 regular Cokes a day.  We discussed the risk of diabetes.  He needs to lose weight.  He will try to stop drinking Cokes

## 2020-09-15 NOTE — Progress Notes (Signed)
Subjective:  Patient ID: Dean Glass, male    DOB: Mar 26, 1976  Age: 45 y.o. MRN: [id number]  CC: Annual Exam   HPI Dean Glass presents for a well exam Haile has gained weight after he stopped smoking.  He drinks up to 5 regular Cokes a day  Outpatient Medications Prior to Visit  Medication Sig Dispense Refill  . aspirin EC 81 MG tablet Take 1 tablet (81 mg total) by mouth daily. 100 tablet 3  . atorvastatin (LIPITOR) 10 MG tablet Take 1 tablet (10 mg total) by mouth daily. 90 tablet 3  . Cholecalciferol (VITAMIN D3) 50 MCG (2000 UT) capsule Take 1 capsule (2,000 Units total) by mouth daily. 100 capsule 3  . meloxicam (MOBIC) 15 MG tablet TAKE 1 TABLET BY MOUTH DAILY 30 tablet 2  . fluticasone (FLONASE) 50 MCG/ACT nasal spray Place 2 sprays into both nostrils daily. 16 g 0   No facility-administered medications prior to visit.    ROS: Review of Systems  Constitutional: Positive for unexpected weight change. Negative for appetite change and fatigue.  HENT: Negative for congestion, nosebleeds, sneezing, sore throat and trouble swallowing.   Eyes: Negative for itching and visual disturbance.  Respiratory: Negative for cough.   Cardiovascular: Negative for chest pain, palpitations and leg swelling.  Gastrointestinal: Negative for abdominal distention, blood in stool, diarrhea and nausea.  Genitourinary: Negative for frequency and hematuria.  Musculoskeletal: Positive for back pain. Negative for gait problem, joint swelling and neck pain.  Skin: Negative for rash.  Neurological: Negative for dizziness, tremors, speech difficulty and weakness.  Psychiatric/Behavioral: Negative for agitation, dysphoric mood and sleep disturbance. The patient is not nervous/anxious.     Objective:  BP 126/72 (BP Location: Left Arm)   Pulse 92   Temp 99 F (37.2 C) (Oral)   Ht 5\' 7"  (1.702 m)   Wt 214 lb (97.1 kg)   SpO2 97%   BMI 33.52 kg/m   BP Readings from Last 3 Encounters:   09/15/20 126/72  03/12/20 120/82  12/17/19 118/80    Wt Readings from Last 3 Encounters:  09/15/20 214 lb (97.1 kg)  03/12/20 205 lb 6.4 oz (93.2 kg)  12/17/19 200 lb (90.7 kg)    Physical Exam Constitutional:      General: He is not in acute distress.    Appearance: He is well-developed. He is obese.     Comments: NAD  Eyes:     Conjunctiva/sclera: Conjunctivae normal.     Pupils: Pupils are equal, round, and reactive to light.  Neck:     Thyroid: No thyromegaly.     Vascular: No JVD.  Cardiovascular:     Rate and Rhythm: Normal rate and regular rhythm.     Heart sounds: Normal heart sounds. No murmur heard. No friction rub. No gallop.   Pulmonary:     Effort: Pulmonary effort is normal. No respiratory distress.     Breath sounds: Normal breath sounds. No wheezing or rales.  Chest:     Chest wall: No tenderness.  Abdominal:     General: Bowel sounds are normal. There is no distension.     Palpations: Abdomen is soft. There is no mass.     Tenderness: There is no abdominal tenderness. There is no guarding or rebound.  Musculoskeletal:        General: No tenderness. Normal range of motion.     Cervical back: Normal range of motion.  Lymphadenopathy:     Cervical: No cervical  adenopathy.  Skin:    General: Skin is warm and dry.     Findings: No rash.  Neurological:     Mental Status: He is alert and oriented to person, place, and time.     Cranial Nerves: No cranial nerve deficit.     Motor: No abnormal muscle tone.     Coordination: Coordination normal.     Gait: Gait normal.     Deep Tendon Reflexes: Reflexes are normal and symmetric.  Psychiatric:        Behavior: Behavior normal.        Thought Content: Thought content normal.        Judgment: Judgment normal.     Lab Results  Component Value Date   WBC 8.3 09/04/2019   HGB 15.3 09/04/2019   HCT 44.9 09/04/2019   PLT 278.0 09/04/2019   GLUCOSE 124 (H) 09/12/2020   CHOL 121 09/12/2020   TRIG 126.0  09/12/2020   HDL 38.70 (L) 09/12/2020   LDLCALC 57 09/12/2020   ALT 36 09/12/2020   AST 19 09/12/2020   NA 139 09/12/2020   K 3.9 09/12/2020   CL 105 09/12/2020   CREATININE 0.88 09/12/2020   BUN 9 09/12/2020   CO2 25 09/12/2020   TSH 1.71 09/04/2019   PSA 1.46 09/04/2019    CT CARDIAC SCORING  Addendum Date: 09/19/2019   ADDENDUM REPORT: 09/19/2019 13:55 CLINICAL DATA:  Risk stratification EXAM: Coronary Calcium Score TECHNIQUE: The patient was scanned on a [employer] scanner. Axial non-contrast 3 mm slices were carried out through the heart. The data set was analyzed on a dedicated work station and scored using the [employer]. FINDINGS: Non-cardiac: See separate report from [facility]. Ascending Aorta: Normal size, measuring 30 mm at the mid ascending aorta, measured double oblique at PA bifurcation. No significant calcification. Pericardium: Normal Coronary arteries: Arise from normal coronary cusps. IMPRESSION: Coronary calcium score of 2. This was out of the range for comparable matched controls (starts at age 46). Bridgette Christopher Electronically Signed   By: Buford Dresser M.D.   On: 09/19/2019 13:55   Result Date: 09/19/2019 EXAM: OVER-READ INTERPRETATION  CT CHEST The following report is an over-read performed by radiologist Dr. Vinnie Langton of [facility], [place] on 09/18/2019. This over-read does not include interpretation of cardiac or coronary anatomy or pathology. The coronary calcium score interpretation by the cardiologist is attached. COMPARISON:  None. FINDINGS: Within the visualized portions of the thorax there are no suspicious appearing pulmonary nodules or masses, there is no acute consolidative airspace disease, no pleural effusions, no pneumothorax and no lymphadenopathy. Visualized portions of the upper abdomen are unremarkable. There are no aggressive appearing lytic or blastic lesions noted in the visualized portions of the skeleton.  IMPRESSION: No significant incidental noncardiac findings are noted. Electronically Signed: By: Vinnie Langton M.D. On: 09/18/2019 16:43    Assessment & Plan:   There are no diagnoses linked to this encounter.   No orders of the defined types were placed in this encounter.    Follow-up: No follow-ups on file.  Walker Kehr, MD

## 2020-09-16 NOTE — Assessment & Plan Note (Signed)
On Lipitor 

## 2021-03-18 ENCOUNTER — Ambulatory Visit: Payer: 59 | Admitting: Internal Medicine

## 2021-03-31 ENCOUNTER — Other Ambulatory Visit (INDEPENDENT_AMBULATORY_CARE_PROVIDER_SITE_OTHER): Payer: 59

## 2021-03-31 DIAGNOSIS — R739 Hyperglycemia, unspecified: Secondary | ICD-10-CM

## 2021-03-31 DIAGNOSIS — Z0000 Encounter for general adult medical examination without abnormal findings: Secondary | ICD-10-CM

## 2021-03-31 LAB — COMPREHENSIVE METABOLIC PANEL
ALT: 31 U/L (ref 0–53)
AST: 20 U/L (ref 0–37)
Albumin: 4.2 g/dL (ref 3.5–5.2)
Alkaline Phosphatase: 60 U/L (ref 39–117)
BUN: 8 mg/dL (ref 6–23)
CO2: 23 mEq/L (ref 19–32)
Calcium: 8.9 mg/dL (ref 8.4–10.5)
Chloride: 105 mEq/L (ref 96–112)
Creatinine, Ser: 0.87 mg/dL (ref 0.40–1.50)
GFR: 104 mL/min (ref >60.00)
Glucose, Bld: 132 mg/dL — ABNORMAL HIGH (ref 70–99)
Potassium: 3.9 mEq/L (ref 3.5–5.1)
Sodium: 139 mEq/L (ref 135–145)
Total Bilirubin: 0.8 mg/dL (ref 0.2–1.2)
Total Protein: 6.8 g/dL (ref 6.0–8.3)

## 2021-03-31 LAB — LIPID PANEL
Cholesterol: 136 mg/dL (ref 0–200)
HDL: 37.8 mg/dL — ABNORMAL LOW (ref >39.00)
LDL Cholesterol: 74 mg/dL (ref 0–99)
NonHDL: 98.02
Total CHOL/HDL Ratio: 4
Triglycerides: 120 mg/dL (ref 0.0–149.0)
VLDL: 24 mg/dL (ref 0.0–40.0)

## 2021-03-31 LAB — CBC WITH DIFFERENTIAL/PLATELET
Basophils Absolute: 0 10*3/uL (ref 0.0–0.1)
Basophils Relative: 0.7 % (ref 0.0–3.0)
Eosinophils Absolute: 0.1 10*3/uL (ref 0.0–0.7)
Eosinophils Relative: 1.6 % (ref 0.0–5.0)
HCT: 40.7 % (ref 39.0–52.0)
Hemoglobin: 13.8 g/dL (ref 13.0–17.0)
Lymphocytes Relative: 50.7 % — ABNORMAL HIGH (ref 12.0–46.0)
Lymphs Abs: 3.3 10*3/uL (ref 0.7–4.0)
MCHC: 34 g/dL (ref 30.0–36.0)
MCV: 88.9 fl (ref 78.0–100.0)
Monocytes Absolute: 0.6 10*3/uL (ref 0.1–1.0)
Monocytes Relative: 9.2 % (ref 3.0–12.0)
Neutro Abs: 2.5 10*3/uL (ref 1.4–7.7)
Neutrophils Relative %: 37.8 % — ABNORMAL LOW (ref 43.0–77.0)
Platelets: 263 10*3/uL (ref 150.0–400.0)
RBC: 4.58 Mil/uL (ref 4.22–5.81)
RDW: 13.5 % (ref 11.5–15.5)
WBC: 6.6 10*3/uL (ref 4.0–10.5)

## 2021-03-31 LAB — URINALYSIS
Bilirubin Urine: NEGATIVE
Ketones, ur: NEGATIVE
Leukocytes,Ua: NEGATIVE
Nitrite: NEGATIVE
Specific Gravity, Urine: >=1.03 — AB (ref 1.000–1.030)
Total Protein, Urine: NEGATIVE
Urine Glucose: NEGATIVE
Urobilinogen, UA: 0.2 (ref 0.0–1.0)
pH: 6 (ref 5.0–8.0)

## 2021-03-31 LAB — TSH: TSH: 2.27 u[IU]/mL (ref 0.35–5.50)

## 2021-03-31 LAB — HEMOGLOBIN A1C: Hgb A1c MFr Bld: 7 % — ABNORMAL HIGH (ref 4.6–6.5)

## 2021-04-01 ENCOUNTER — Ambulatory Visit (INDEPENDENT_AMBULATORY_CARE_PROVIDER_SITE_OTHER): Payer: 59 | Admitting: Internal Medicine

## 2021-04-01 ENCOUNTER — Other Ambulatory Visit: Payer: Self-pay

## 2021-04-01 ENCOUNTER — Encounter: Payer: Self-pay | Admitting: Internal Medicine

## 2021-04-01 VITALS — BP 122/70 | HR 101 | Temp 98.1°F | Ht 67.0 in | Wt 212.8 lb

## 2021-04-01 DIAGNOSIS — R739 Hyperglycemia, unspecified: Secondary | ICD-10-CM | POA: Diagnosis not present

## 2021-04-01 DIAGNOSIS — I2583 Coronary atherosclerosis due to lipid rich plaque: Secondary | ICD-10-CM

## 2021-04-01 DIAGNOSIS — I2510 Atherosclerotic heart disease of native coronary artery without angina pectoris: Secondary | ICD-10-CM | POA: Diagnosis not present

## 2021-04-01 DIAGNOSIS — E785 Hyperlipidemia, unspecified: Secondary | ICD-10-CM | POA: Diagnosis not present

## 2021-04-01 DIAGNOSIS — Z0000 Encounter for general adult medical examination without abnormal findings: Secondary | ICD-10-CM

## 2021-04-01 DIAGNOSIS — Z23 Encounter for immunization: Secondary | ICD-10-CM | POA: Diagnosis not present

## 2021-04-01 MED ORDER — METFORMIN HCL 500 MG PO TABS: 500.0000 mg | ORAL_TABLET | Freq: Every day | ORAL | 3 refills | Status: DC

## 2021-04-01 NOTE — Patient Instructions (Signed)
The Obesity Code book by Sharman Cheek   These suggestions will probably help you to improve your metabolism if you are not overweight and to lose weight if you are overweight: 1.  Reduce your consumption of sugars and starches.  Eliminate high fructose corn syrup from your diet.  Reduce your consumption of processed foods.  For desserts try to have seasonal fruits, berries, nuts, cheeses or dark chocolate with more than 70% cacao. 2.  Do not snack 3.  You do not have to eat breakfast.  If you choose to have breakfast - eat plain greek yogurt, eggs, oatmeal (without sugar) - use honey if you need to. 4.  Drink water, freshly brewed unsweetened tea (green, black or herbal) or coffee.  Do not drink sodas including diet sodas , juices, beverages sweetened with artificial sweeteners. 5.  Reduce your consumption of refined grains. 6.  Avoid protein drinks such as Optifast, Slim fast etc. Eat chicken, fish, meat, dairy and beans for your sources of protein. 7.  Natural unprocessed fats like cold pressed virgin olive oil, butter, coconut oil are good for you.  Eat avocados. 8.  Increase your consumption of fiber.  Fruits, berries, vegetables, whole grains, flaxseed, chia seeds, beans, popcorn, nuts, oatmeal are good sources of fiber 9.  Use vinegar in your diet, i.e. apple cider vinegar, red wine or balsamic vinegar 10.  You can try fasting.  For example you can skip breakfast and lunch every other day (24-hour fast) 11.  Stress reduction, good night sleep, relaxation, meditation, yoga and other physical activity is likely to help you to maintain low weight too. 12.  If you drink alcohol, limit your alcohol intake to no more than 2 drinks a day.

## 2021-04-01 NOTE — Assessment & Plan Note (Signed)
Mild Lipitor qod

## 2021-04-01 NOTE — Assessment & Plan Note (Signed)
On lipitor

## 2021-04-01 NOTE — Progress Notes (Signed)
Subjective:  Patient ID: Dean Glass, male    DOB: September 14, 1975  Age: 45 y.o. MRN: [id number]  CC: Follow-up (6 MONTH F/U- Flu shot)   HPI Dean Glass presents for dyslipidemia, hyperglycemia, mild CAD  Outpatient Medications Prior to Visit  Medication Sig Dispense Refill   atorvastatin (LIPITOR) 10 MG tablet TAKE 1 TABLET (10 MG TOTAL) BY MOUTH DAILY. 90 tablet 3   Cholecalciferol (VITAMIN D3) 50 MCG (2000 UT) capsule Take 1 capsule (2,000 Units total) by mouth daily. 100 capsule 3   meloxicam (MOBIC) 15 MG tablet TAKE 1 TABLET (15 MG TOTAL) BY MOUTH DAILY. 30 tablet 2   No facility-administered medications prior to visit.    ROS: Review of Systems  Constitutional:  Negative for appetite change, fatigue and unexpected weight change.  HENT:  Negative for congestion, nosebleeds, sneezing, sore throat and trouble swallowing.   Eyes:  Negative for itching and visual disturbance.  Respiratory:  Negative for cough.   Cardiovascular:  Negative for chest pain, palpitations and leg swelling.  Gastrointestinal:  Negative for abdominal distention, blood in stool, diarrhea and nausea.  Genitourinary:  Negative for frequency and hematuria.  Musculoskeletal:  Negative for back pain, gait problem, joint swelling and neck pain.  Skin:  Negative for rash.  Neurological:  Negative for dizziness, tremors, speech difficulty and weakness.  Psychiatric/Behavioral:  Negative for agitation, dysphoric mood, sleep disturbance and suicidal ideas. The patient is not nervous/anxious.    Objective:  BP 122/70 (BP Location: Left Arm)   Pulse (!) 101   Temp 98.1 F (36.7 C) (Oral)   Ht 5\' 7"  (1.702 m)   Wt 212 lb 12.8 oz (96.5 kg)   SpO2 97%   BMI 33.33 kg/m   BP Readings from Last 3 Encounters:  04/01/21 122/70  09/15/20 126/72  03/12/20 120/82    Wt Readings from Last 3 Encounters:  04/01/21 212 lb 12.8 oz (96.5 kg)  09/15/20 214 lb (97.1 kg)  03/12/20 205 lb 6.4 oz (93.2 kg)     Physical Exam Constitutional:      General: He is not in acute distress.    Appearance: Normal appearance. He is well-developed.     Comments: NAD  Eyes:     Conjunctiva/sclera: Conjunctivae normal.     Pupils: Pupils are equal, round, and reactive to light.  Neck:     Thyroid: No thyromegaly.     Vascular: No JVD.  Cardiovascular:     Rate and Rhythm: Normal rate and regular rhythm.     Heart sounds: Normal heart sounds. No murmur heard.   No friction rub. No gallop.  Pulmonary:     Effort: Pulmonary effort is normal. No respiratory distress.     Breath sounds: Normal breath sounds. No wheezing or rales.  Chest:     Chest wall: No tenderness.  Abdominal:     General: Bowel sounds are normal. There is no distension.     Palpations: Abdomen is soft. There is no mass.     Tenderness: There is no abdominal tenderness. There is no guarding or rebound.  Musculoskeletal:        General: No tenderness. Normal range of motion.     Cervical back: Normal range of motion.  Lymphadenopathy:     Cervical: No cervical adenopathy.  Skin:    General: Skin is warm and dry.     Findings: No rash.  Neurological:     Mental Status: He is alert and oriented to person,  place, and time.     Cranial Nerves: No cranial nerve deficit.     Motor: No abnormal muscle tone.     Coordination: Coordination normal.     Gait: Gait normal.     Deep Tendon Reflexes: Reflexes are normal and symmetric.  Psychiatric:        Behavior: Behavior normal.        Thought Content: Thought content normal.        Judgment: Judgment normal.    Lab Results  Component Value Date   WBC 6.6 03/31/2021   HGB 13.8 03/31/2021   HCT 40.7 03/31/2021   PLT 263.0 03/31/2021   GLUCOSE 132 (H) 03/31/2021   CHOL 136 03/31/2021   TRIG 120.0 03/31/2021   HDL 37.80 (L) 03/31/2021   LDLCALC 74 03/31/2021   ALT 31 03/31/2021   AST 20 03/31/2021   NA 139 03/31/2021   K 3.9 03/31/2021   CL 105 03/31/2021   CREATININE  0.87 03/31/2021   BUN 8 03/31/2021   CO2 23 03/31/2021   TSH 2.27 03/31/2021   PSA 1.46 09/04/2019   HGBA1C 7.0 (H) 03/31/2021    CT CARDIAC SCORING  Addendum Date: 09/19/2019   ADDENDUM REPORT: 09/19/2019 13:55 CLINICAL DATA:  Risk stratification EXAM: Coronary Calcium Score TECHNIQUE: The patient was scanned on a [employer] scanner. Axial non-contrast 3 mm slices were carried out through the heart. The data set was analyzed on a dedicated work station and scored using the [place]. FINDINGS: Non-cardiac: See separate report from [facility]. Ascending Aorta: Normal size, measuring 30 mm at the mid ascending aorta, measured double oblique at PA bifurcation. No significant calcification. Pericardium: Normal Coronary arteries: Arise from normal coronary cusps. IMPRESSION: Coronary calcium score of 2. This was out of the range for comparable matched controls (starts at age 4). Bridgette Christopher Electronically Signed   By: Buford Dresser M.D.   On: 09/19/2019 13:55   Result Date: 09/19/2019 EXAM: OVER-READ INTERPRETATION  CT CHEST The following report is an over-read performed by radiologist Dr. Vinnie Langton of [facility], [place] on 09/18/2019. This over-read does not include interpretation of cardiac or coronary anatomy or pathology. The coronary calcium score interpretation by the cardiologist is attached. COMPARISON:  None. FINDINGS: Within the visualized portions of the thorax there are no suspicious appearing pulmonary nodules or masses, there is no acute consolidative airspace disease, no pleural effusions, no pneumothorax and no lymphadenopathy. Visualized portions of the upper abdomen are unremarkable. There are no aggressive appearing lytic or blastic lesions noted in the visualized portions of the skeleton. IMPRESSION: No significant incidental noncardiac findings are noted. Electronically Signed: By: Vinnie Langton M.D. On: 09/18/2019 16:43     Assessment & Plan:   Problem List Items Addressed This Visit     Coronary atherosclerosis    Mild Lipitor qod      Dyslipidemia - Primary    On lipitor      Relevant Medications   metFORMIN (GLUCOPHAGE) 500 MG tablet   Other Relevant Orders   Comprehensive metabolic panel   CBC with Differential/Platelet   Hemoglobin A1c   CBC with Differential/Platelet   Lipid panel   TSH   PSA   Hyperglycemia    Check A1c in 6 mo or sooner Loose wt, low carb diet, fasting Start Metformin 500 mg qam if polydipsia and polyuria develop      Relevant Orders   Hemoglobin A1c   Well adult exam   Relevant Medications  metFORMIN (GLUCOPHAGE) 500 MG tablet   Other Relevant Orders   Comprehensive metabolic panel   CBC with Differential/Platelet   CBC with Differential/Platelet   Lipid panel   TSH   PSA   Other Visit Diagnoses     Needs flu shot             Follow-up: Return for Wellness Exam.  Walker Kehr, MD

## 2021-04-01 NOTE — Assessment & Plan Note (Addendum)
Check A1c in 6 mo or sooner Loose wt, low carb diet, fasting Start Metformin 500 mg qam if polydipsia and polyuria develop

## 2021-05-06 ENCOUNTER — Ambulatory Visit (INDEPENDENT_AMBULATORY_CARE_PROVIDER_SITE_OTHER): Payer: 59 | Admitting: Emergency Medicine

## 2021-05-06 ENCOUNTER — Emergency Department (HOSPITAL_COMMUNITY)
Admission: EM | Admit: 2021-05-06 | Discharge: 2021-05-07 | Disposition: A | Discharge status: Home / Self Care | Payer: 59 | Attending: Emergency Medicine | Admitting: Emergency Medicine

## 2021-05-06 ENCOUNTER — Other Ambulatory Visit: Payer: Self-pay

## 2021-05-06 ENCOUNTER — Encounter: Payer: Self-pay | Admitting: Emergency Medicine

## 2021-05-06 ENCOUNTER — Encounter (HOSPITAL_COMMUNITY): Payer: Self-pay

## 2021-05-06 VITALS — BP 132/80 | HR 111 | Temp 99.1°F | Ht 67.0 in | Wt 205.0 lb

## 2021-05-06 DIAGNOSIS — Z7984 Long term (current) use of oral hypoglycemic drugs: Secondary | ICD-10-CM | POA: Diagnosis not present

## 2021-05-06 DIAGNOSIS — K8500 Idiopathic acute pancreatitis without necrosis or infection: Secondary | ICD-10-CM | POA: Insufficient documentation

## 2021-05-06 DIAGNOSIS — Z87891 Personal history of nicotine dependence: Secondary | ICD-10-CM | POA: Diagnosis not present

## 2021-05-06 DIAGNOSIS — R1013 Epigastric pain: Secondary | ICD-10-CM

## 2021-05-06 DIAGNOSIS — Z7951 Long term (current) use of inhaled steroids: Secondary | ICD-10-CM | POA: Insufficient documentation

## 2021-05-06 LAB — COMPREHENSIVE METABOLIC PANEL
ALT: 20 U/L (ref 0–53)
ALT: 22 U/L (ref 0–44)
AST: 14 U/L (ref 0–37)
AST: 16 U/L (ref 15–41)
Albumin: 4.3 g/dL (ref 3.5–5.2)
Albumin: 4.1 g/dL (ref 3.5–5.0)
Alkaline Phosphatase: 60 U/L (ref 39–117)
Alkaline Phosphatase: 57 U/L (ref 38–126)
Anion gap: 10 (ref 5–15)
BUN: 10 mg/dL (ref 6–23)
BUN: 9 mg/dL (ref 6–20)
CO2: 28 mEq/L (ref 19–32)
CO2: 22 mmol/L (ref 22–32)
Calcium: 9.4 mg/dL (ref 8.4–10.5)
Calcium: 9.2 mg/dL (ref 8.9–10.3)
Chloride: 101 mEq/L (ref 96–112)
Chloride: 103 mmol/L (ref 98–111)
Creatinine, Ser: 0.84 mg/dL (ref 0.40–1.50)
Creatinine, Ser: 0.72 mg/dL (ref 0.61–1.24)
GFR, Estimated: >60 mL/min (ref >60)
GFR: 105.03 mL/min (ref >60.00)
Glucose, Bld: 105 mg/dL — ABNORMAL HIGH (ref 70–99)
Glucose, Bld: 188 mg/dL — ABNORMAL HIGH (ref 70–99)
Potassium: 3.8 mEq/L (ref 3.5–5.1)
Potassium: 3.8 mmol/L (ref 3.5–5.1)
Sodium: 136 mEq/L (ref 135–145)
Sodium: 135 mmol/L (ref 135–145)
Total Bilirubin: 1.6 mg/dL — ABNORMAL HIGH (ref 0.2–1.2)
Total Bilirubin: 1.8 mg/dL — ABNORMAL HIGH (ref 0.3–1.2)
Total Protein: 7.5 g/dL (ref 6.0–8.3)
Total Protein: 7.8 g/dL (ref 6.5–8.1)

## 2021-05-06 LAB — CBC
HCT: 43 % (ref 39.0–52.0)
Hemoglobin: 14.7 g/dL (ref 13.0–17.0)
MCH: 30.8 pg (ref 26.0–34.0)
MCHC: 34.2 g/dL (ref 30.0–36.0)
MCV: 90.1 fL (ref 80.0–100.0)
Platelets: 266 10*3/uL (ref 150–400)
RBC: 4.77 MIL/uL (ref 4.22–5.81)
RDW: 12.1 % (ref 11.5–15.5)
WBC: 14.8 10*3/uL — ABNORMAL HIGH (ref 4.0–10.5)
nRBC: 0 % (ref 0.0–0.2)

## 2021-05-06 LAB — LIPASE, BLOOD: Lipase: 66 U/L — ABNORMAL HIGH (ref 11–51)

## 2021-05-06 LAB — CBC WITH DIFFERENTIAL/PLATELET
Basophils Absolute: 0 10*3/uL (ref 0.0–0.1)
Basophils Relative: 0.3 % (ref 0.0–3.0)
Eosinophils Absolute: 0.2 10*3/uL (ref 0.0–0.7)
Eosinophils Relative: 1.6 % (ref 0.0–5.0)
HCT: 42.6 % (ref 39.0–52.0)
Hemoglobin: 14.3 g/dL (ref 13.0–17.0)
Lymphocytes Relative: 19.5 % (ref 12.0–46.0)
Lymphs Abs: 2.7 10*3/uL (ref 0.7–4.0)
MCHC: 33.5 g/dL (ref 30.0–36.0)
MCV: 90 fl (ref 78.0–100.0)
Monocytes Absolute: 1.3 10*3/uL — ABNORMAL HIGH (ref 0.1–1.0)
Monocytes Relative: 9 % (ref 3.0–12.0)
Neutro Abs: 9.7 10*3/uL — ABNORMAL HIGH (ref 1.4–7.7)
Neutrophils Relative %: 69.6 % (ref 43.0–77.0)
Platelets: 259 10*3/uL (ref 150.0–400.0)
RBC: 4.74 Mil/uL (ref 4.22–5.81)
RDW: 12.8 % (ref 11.5–15.5)
WBC: 13.9 10*3/uL — ABNORMAL HIGH (ref 4.0–10.5)

## 2021-05-06 LAB — LIPASE: Lipase: 98 U/L — ABNORMAL HIGH (ref 11.0–59.0)

## 2021-05-06 MED ORDER — PANTOPRAZOLE SODIUM 40 MG PO TBEC: 40.0000 mg | DELAYED_RELEASE_TABLET | Freq: Every day | ORAL | 0 refills | Status: DC

## 2021-05-06 NOTE — Progress Notes (Signed)
Dean Glass 45 y.o.   Chief Complaint  Patient presents with   Office Visit    Discomfort in upper abdomen, nausea, low-grade temp x2 days    HISTORY OF PRESENT ILLNESS: This is a 45 y.o. male complaining of upper abdominal pain that started 2 days ago after eating whopper at [employer]. Complaining of steady burning epigastric pain without radiation associated with nausea but no vomiting or diarrhea.  Denies melena or rectal bleeding.  Had low-grade fever.  No flulike symptoms.  No new medications. No other complaints or medical concerns today.  HPI   Prior to Admission medications   Medication Sig Start Date End Date Taking? Authorizing Provider  atorvastatin (LIPITOR) 10 MG tablet TAKE 1 TABLET (10 MG TOTAL) BY MOUTH DAILY. 09/15/20 09/15/21 Yes Plotnikov, Evie Lacks, MD  Cholecalciferol (VITAMIN D3) 50 MCG (2000 UT) capsule Take 1 capsule (2,000 Units total) by mouth daily. 09/21/19  Yes Plotnikov, Evie Lacks, MD  meloxicam (MOBIC) 15 MG tablet TAKE 1 TABLET (15 MG TOTAL) BY MOUTH DAILY. 09/15/20 09/15/21 Yes Plotnikov, Evie Lacks, MD  metFORMIN (GLUCOPHAGE) 500 MG tablet Take 1 tablet (500 mg total) by mouth daily with breakfast. 04/01/21  Yes Plotnikov, Evie Lacks, MD  fluticasone (FLONASE) 50 MCG/ACT nasal spray Place 2 sprays into both nostrils daily. 05/08/20 09/15/20  McVey, Gelene Mink, PA-C    No Known Allergies  Patient Active Problem List   Diagnosis Date Noted   Dyslipidemia 09/22/2019   Coronary atherosclerosis 09/21/2019   NEOPLASM OF UNCERTAIN BEHAVIOR OF SKIN 03/31/2010   TOBACCO USER 07/25/2009    Past Medical History:  Diagnosis Date   GERD (gastroesophageal reflux disease)    Seasonal allergies     Past Surgical History:  Procedure Laterality Date   VASECTOMY      Social History   Socioeconomic History   Marital status: Married    Spouse name: Not on file   Number of children: Not on file   Years of education: Not on file   Highest  education level: Not on file  Occupational History   Not on file  Tobacco Use   Smoking status: Every Day    Packs/day: 0.50    Types: Cigarettes    Last attempt to quit: 03/13/2015    Years since quitting: 6.1   Smokeless tobacco: Former  Substance and Sexual Activity   Alcohol use: Yes    Alcohol/week: 0.0 standard drinks    Comment: Social   Drug use: No   Sexual activity: Yes  Other Topics Concern   Not on file  Social History Narrative   Not on file   Social Determinants of Health   Financial Resource Strain: Not on file  Food Insecurity: Not on file  Transportation Needs: Not on file  Physical Activity: Not on file  Stress: Not on file  Social Connections: Not on file  Intimate Partner Violence: Not on file    Family History  Problem Relation Age of Onset   Diabetes Mother    Coronary artery disease Father    Heart disease Father        cad   Early death Brother 54       cystic kidney     Review of Systems  Constitutional: Negative.  Negative for chills and fever.  HENT: Negative.  Negative for congestion and sore throat.   Respiratory: Negative.  Negative for cough and shortness of breath.   Cardiovascular: Negative.  Negative for chest pain and palpitations.  Gastrointestinal:  Positive for abdominal pain, heartburn and nausea. Negative for blood in stool, diarrhea, melena and vomiting.  Skin: Negative.  Negative for rash.  Neurological: Negative.  Negative for dizziness and headaches.  All other systems reviewed and are negative.  Today's Vitals   05/06/21 1357  BP: 132/80  Pulse: (!) 111  Temp: 99.1 F (37.3 C)  TempSrc: Oral  SpO2: 97%  Weight: 205 lb (93 kg)  Height: 5\' 7"  (1.702 m)   Body mass index is 32.11 kg/m.  Physical Exam Vitals reviewed.  Constitutional:      Appearance: Normal appearance.  HENT:     Head: Normocephalic.     Mouth/Throat:     Mouth: Mucous membranes are moist.     Pharynx: Oropharynx is clear.  Eyes:      Extraocular Movements: Extraocular movements intact.     Pupils: Pupils are equal, round, and reactive to light.  Cardiovascular:     Rate and Rhythm: Normal rate and regular rhythm.     Pulses: Normal pulses.     Heart sounds: Normal heart sounds.     Comments: Repeat heart rate: 80 and regular Pulmonary:     Effort: Pulmonary effort is normal.     Breath sounds: Normal breath sounds.  Abdominal:     General: Bowel sounds are normal. There is no distension.     Palpations: Abdomen is soft. There is no mass.     Tenderness: There is abdominal tenderness (Epigastric area). There is no guarding or rebound.     Hernia: No hernia is present.  Musculoskeletal:     Cervical back: Normal range of motion and neck supple.     Right lower leg: No edema.     Left lower leg: No edema.  Skin:    General: Skin is warm and dry.     Capillary Refill: Capillary refill takes less than 2 seconds.  Neurological:     General: No focal deficit present.     Mental Status: He is alert and oriented to person, place, and time.  Psychiatric:        Mood and Affect: Mood normal.        Behavior: Behavior normal.     ASSESSMENT & PLAN: Problem List Items Addressed This Visit   None Visit Diagnoses     Epigastric pain    -  Primary   Relevant Medications   pantoprazole (PROTONIX) 40 MG tablet   Other Relevant Orders   CBC with Differential/Platelet   Comprehensive metabolic panel   Lipase   Dyspepsia            Clinically stable.  No red flag signs or symptoms.  Stable vital signs.  Afebrile.  Benign abdominal examination.  Differential diagnosis discussed with patient.  We will do blood work and start patient on pantoprazole 40 mg daily.  Can take Mylanta or Maalox as needed during today.  Advised to monitor symptoms, stay on a bland diet, stay well-hydrated, and follow-up in the office if no better or worse during the next several days.  Patient Instructions  Abdominal Pain, Adult Many  things can cause belly (abdominal) pain. Most times, belly pain is not dangerous. Many cases of belly pain can be watched and treated at home. Sometimes, though, belly pain is serious. Your doctor will try to find the cause of your belly pain. Follow these instructions at home: Medicines Take over-the-counter and prescription medicines only as told by your doctor. Do not take  medicines that help you poop (laxatives) unless told by your doctor. General instructions Watch your belly pain for any changes. Drink enough fluid to keep your pee (urine) pale yellow. Keep all follow-up visits as told by your doctor. This is important. Contact a doctor if: Your belly pain changes or gets worse. You are not hungry, or you lose weight without trying. You are having trouble pooping (constipated) or have watery poop (diarrhea) for more than 2-3 days. You have pain when you pee or poop. Your belly pain wakes you up at night. Your pain gets worse with meals, after eating, or with certain foods. You are vomiting and cannot keep anything down. You have a fever. You have blood in your pee. Get help right away if: Your pain does not go away as soon as your doctor says it should. You cannot stop vomiting. Your pain is only in areas of your belly, such as the right side or the left lower part of the belly. You have bloody or black poop, or poop that looks like tar. You have very bad pain, cramping, or bloating in your belly. You have signs of not having enough fluid or water in your body (dehydration), such as: Dark pee, very little pee, or no pee. Cracked lips. Dry mouth. Sunken eyes. Sleepiness. Weakness. You have trouble breathing or chest pain. Summary Many cases of belly pain can be watched and treated at home. Watch your belly pain for any changes. Take over-the-counter and prescription medicines only as told by your doctor. Contact a doctor if your belly pain changes or gets worse. Get help  right away if you have very bad pain, cramping, or bloating in your belly. This information is not intended to replace advice given to you by your health care provider. Make sure you discuss any questions you have with your health care provider. Document Revised: 10/23/2018 Document Reviewed: 10/23/2018 Elsevier Patient Education  2022 [place], MD [facility]

## 2021-05-06 NOTE — Patient Instructions (Signed)
Abdominal Pain, Adult Many things can cause belly (abdominal) pain. Most times, belly pain is not dangerous. Many cases of belly pain can be watched and treated at home. Sometimes, though, belly pain is serious. Yourdoctor will try to find the cause of your belly pain. Follow these instructions at home:  Medicines Take over-the-counter and prescription medicines only as told by your doctor. Do not take medicines that help you poop (laxatives) unless told by your doctor. General instructions Watch your belly pain for any changes. Drink enough fluid to keep your pee (urine) pale yellow. Keep all follow-up visits as told by your doctor. This is important. Contact a doctor if: Your belly pain changes or gets worse. You are not hungry, or you lose weight without trying. You are having trouble pooping (constipated) or have watery poop (diarrhea) for more than 2-3 days. You have pain when you pee or poop. Your belly pain wakes you up at night. Your pain gets worse with meals, after eating, or with certain foods. You are vomiting and cannot keep anything down. You have a fever. You have blood in your pee. Get help right away if: Your pain does not go away as soon as your doctor says it should. You cannot stop vomiting. Your pain is only in areas of your belly, such as the right side or the left lower part of the belly. You have bloody or black poop, or poop that looks like tar. You have very bad pain, cramping, or bloating in your belly. You have signs of not having enough fluid or water in your body (dehydration), such as: Dark pee, very little pee, or no pee. Cracked lips. Dry mouth. Sunken eyes. Sleepiness. Weakness. You have trouble breathing or chest pain. Summary Many cases of belly pain can be watched and treated at home. Watch your belly pain for any changes. Take over-the-counter and prescription medicines only as told by your doctor. Contact a doctor if your belly pain  changes or gets worse. Get help right away if you have very bad pain, cramping, or bloating in your belly. This information is not intended to replace advice given to you by your health care provider. Make sure you discuss any questions you have with your healthcare provider. Document Revised: 10/23/2018 Document Reviewed: 10/23/2018 Elsevier Patient Education  2022 Elsevier Inc[employer]  

## 2021-05-06 NOTE — ED Triage Notes (Signed)
Pt reports upper abdominal pain since Monday.

## 2021-05-07 ENCOUNTER — Encounter (HOSPITAL_COMMUNITY): Payer: Self-pay

## 2021-05-07 ENCOUNTER — Other Ambulatory Visit: Payer: Self-pay | Admitting: Emergency Medicine

## 2021-05-07 ENCOUNTER — Emergency Department (HOSPITAL_COMMUNITY): Payer: 59

## 2021-05-07 DIAGNOSIS — R1013 Epigastric pain: Secondary | ICD-10-CM

## 2021-05-07 DIAGNOSIS — K859 Acute pancreatitis without necrosis or infection, unspecified: Secondary | ICD-10-CM

## 2021-05-07 LAB — URINALYSIS, ROUTINE W REFLEX MICROSCOPIC
Bacteria, UA: NONE SEEN
Bilirubin Urine: NEGATIVE
Glucose, UA: 50 mg/dL — AB
Ketones, ur: 20 mg/dL — AB
Leukocytes,Ua: NEGATIVE
Nitrite: NEGATIVE
Protein, ur: 30 mg/dL — AB
Specific Gravity, Urine: 1.026 (ref 1.005–1.030)
pH: 5 (ref 5.0–8.0)

## 2021-05-07 MED ORDER — SODIUM CHLORIDE 0.9 % IV BOLUS (SEPSIS)
1000.0000 mL | Freq: Once | INTRAVENOUS | Status: AC
  Administered 2021-05-07: 1000 mL via INTRAVENOUS

## 2021-05-07 MED ORDER — SODIUM CHLORIDE 0.9 % IV SOLN
1000.0000 mL | INTRAVENOUS | Status: DC
  Administered 2021-05-07: 1000 mL via INTRAVENOUS

## 2021-05-07 MED ORDER — HYDROMORPHONE HCL 1 MG/ML IJ SOLN
0.5000 mg | Freq: Once | INTRAMUSCULAR | Status: AC
  Administered 2021-05-07: 0.5 mg via INTRAVENOUS
  Filled 2021-05-07: qty 1

## 2021-05-07 MED ORDER — IOHEXOL 350 MG/ML SOLN
80.0000 mL | Freq: Once | INTRAVENOUS | Status: AC | PRN
  Administered 2021-05-07: 80 mL via INTRAVENOUS

## 2021-05-07 MED ORDER — OXYCODONE HCL 5 MG PO TABS
2.5000 mg | ORAL_TABLET | Freq: Four times a day (QID) | ORAL | 0 refills | Status: DC | PRN
Start: 2021-05-07 — End: 2021-05-11

## 2021-05-07 MED ORDER — ONDANSETRON 4 MG PO TBDP: 4.0000 mg | ORAL_TABLET | Freq: Three times a day (TID) | ORAL | 0 refills | Status: AC | PRN

## 2021-05-07 NOTE — ED Notes (Signed)
Patient transported to CT 

## 2021-05-07 NOTE — ED Notes (Signed)
Dr. Cardama at bedside.  

## 2021-05-07 NOTE — Discharge Instructions (Signed)
For the next 3 to 5 days, consume a clear diet and slowly progressed to bland and then normal diet.  Attached to have a pancreatitis eating plan to assist you with transitioning to normal diet.  Please follow-up with your primary care provider

## 2021-05-07 NOTE — ED Provider Notes (Signed)
[facility] DEPT Provider Note  CSN: [id number] Arrival date & time: 05/06/21 2203  Chief Complaint(s) Abdominal Pain  HPI Dean Glass is a 45 y.o. male who presents to the emergency department with aching epigastric abdominal pain has been ongoing since Monday.  Patient reports he has had pain like this in the past that usually resolves within several hours after taking antacids but this pain has persisted despite numerous antacid treatments.  He denies any recent fevers or infections.  No coughing or congestion.  He endorses nausea without emesis.  No diarrhea.  He denies any alcohol use.  No illicit drug use.  Reports that he was recently placed on metformin.  No other new medication.  No history of gallstones.   Abdominal Pain  Past Medical History Past Medical History:  Diagnosis Date   GERD (gastroesophageal reflux disease)    Seasonal allergies    Patient Active Problem List   Diagnosis Date Noted   Dyslipidemia 09/22/2019   Coronary atherosclerosis 09/21/2019   NEOPLASM OF UNCERTAIN BEHAVIOR OF SKIN 03/31/2010   TOBACCO USER 07/25/2009   Home Medication(s) Prior to Admission medications   Medication Sig Start Date End Date Taking? Authorizing Provider  atorvastatin (LIPITOR) 10 MG tablet TAKE 1 TABLET (10 MG TOTAL) BY MOUTH DAILY. Patient taking differently: Take 10 mg by mouth daily. 09/15/20 09/15/21 Yes Plotnikov, Evie Lacks, MD  Cholecalciferol (VITAMIN D3) 50 MCG (2000 UT) capsule Take 1 capsule (2,000 Units total) by mouth daily. 09/21/19  Yes Plotnikov, Evie Lacks, MD  metFORMIN (GLUCOPHAGE) 500 MG tablet Take 1 tablet (500 mg total) by mouth daily with breakfast. 04/01/21  Yes Plotnikov, Evie Lacks, MD  ondansetron (ZOFRAN ODT) 4 MG disintegrating tablet Take 1 tablet (4 mg total) by mouth every 8 (eight) hours as needed for up to 3 days for nausea or vomiting. 05/07/21 05/10/21 Yes Franklyn Cafaro, Grayce Sessions, MD  oxyCODONE (ROXICODONE) 5 MG  immediate release tablet Take 0.5-1 tablets (2.5-5 mg total) by mouth every 6 (six) hours as needed for up to 5 days for severe pain. 05/07/21 05/12/21 Yes Tiondra Fang, Grayce Sessions, MD  pantoprazole (PROTONIX) 40 MG tablet Take 1 tablet (40 mg total) by mouth daily for 10 days. 05/06/21 05/16/21 Yes Sagardia, Ines Bloomer, MD  zinc gluconate 50 MG tablet Take 50 mg by mouth daily.   Yes [provider]  meloxicam (MOBIC) 15 MG tablet TAKE 1 TABLET (15 MG TOTAL) BY MOUTH DAILY. Patient taking differently: Take 15 mg by mouth daily as needed for pain. 09/15/20 09/15/21  Plotnikov, Evie Lacks, MD  fluticasone (FLONASE) 50 MCG/ACT nasal spray Place 2 sprays into both nostrils daily. 05/08/20 09/15/20  McVey, Gelene Mink, PA-C                                                                                                                                    Past Surgical History Past Surgical History:  Procedure Laterality Date   VASECTOMY     Family History Family History  Problem Relation Age of Onset   Diabetes Mother    Coronary artery disease Father    Heart disease Father        cad   Early death Brother 91       cystic kidney    Social History Social History   Tobacco Use   Smoking status: Former    Packs/day: 0.50    Types: Cigarettes    Quit date: 03/13/2015    Years since quitting: 6.1   Smokeless tobacco: Former  Substance Use Topics   Alcohol use: Yes    Alcohol/week: 0.0 standard drinks    Comment: Social   Drug use: No   Allergies Patient has no known allergies.  Review of Systems Review of Systems  Gastrointestinal:  Positive for abdominal pain.  All other systems are reviewed and are negative for acute change except as noted in the HPI  Physical Exam Vital Signs  I have reviewed the triage vital signs BP 139/88   Pulse 100   Temp 98.2 F (36.8 C)   Resp 16   Ht 5\' 7"  (1.702 m)   Wt 93 kg   SpO2 98%   BMI 32.11 kg/m   Physical Exam Vitals  reviewed.  Constitutional:      General: He is not in acute distress.    Appearance: He is well-developed. He is not diaphoretic.  HENT:     Head: Normocephalic and atraumatic.     Right Ear: External ear normal.     Left Ear: External ear normal.     Nose: Nose normal.     Mouth/Throat:     Mouth: Mucous membranes are moist.  Eyes:     General: No scleral icterus.    Conjunctiva/sclera: Conjunctivae normal.  Neck:     Trachea: Phonation normal.  Cardiovascular:     Rate and Rhythm: Normal rate and regular rhythm.  Pulmonary:     Effort: Pulmonary effort is normal. No respiratory distress.     Breath sounds: No stridor.  Abdominal:     General: There is no distension.     Tenderness: There is abdominal tenderness (moderate) in the epigastric area. There is no guarding or rebound. Negative signs include Murphy's sign.  Musculoskeletal:        General: Normal range of motion.     Cervical back: Normal range of motion.  Neurological:     Mental Status: He is alert and oriented to person, place, and time.  Psychiatric:        Behavior: Behavior normal.    ED Results and Treatments Labs (all labs ordered are listed, but only abnormal results are displayed) Labs Reviewed  LIPASE, BLOOD - Abnormal; Notable for the following components:      Result Value   Lipase 66 (*)    All other components within normal limits  COMPREHENSIVE METABOLIC PANEL - Abnormal; Notable for the following components:   Glucose, Bld 188 (*)    Total Bilirubin 1.8 (*)    All other components within normal limits  CBC - Abnormal; Notable for the following components:   WBC 14.8 (*)    All other components within normal limits  URINALYSIS, ROUTINE W REFLEX MICROSCOPIC - Abnormal; Notable for the following components:   Glucose, UA 50 (*)    Hgb urine dipstick MODERATE (*)    Ketones, ur 20 (*)    Protein, ur 30 (*)  All other components within normal limits                                                                                                                          EKG  EKG Interpretation  Date/Time:    Ventricular Rate:    PR Interval:    QRS Duration:   QT Interval:    QTC Calculation:   R Axis:     Text Interpretation:         Radiology CT ABDOMEN PELVIS W CONTRAST  Result Date: 05/07/2021 CLINICAL DATA:  Upper abdominal pain x3 days, elevated lipase EXAM: CT ABDOMEN AND PELVIS WITH CONTRAST TECHNIQUE: Multidetector CT imaging of the abdomen and pelvis was performed using the standard protocol following bolus administration of intravenous contrast. CONTRAST:  51mL OMNIPAQUE IOHEXOL 350 MG/ML SOLN COMPARISON:  None. FINDINGS: Lower chest: Mild atelectasis in the lingula and bilateral lower lobes. Hepatobiliary: Liver is within normal limits. Gallbladder is unremarkable. No intrahepatic or extrahepatic duct dilatation. Pancreas: Mild peripancreatic inflammatory changes along the pancreatic body/tail, reflecting acute pancreatitis. No pancreatic atrophy or ductal dilatation. Spleen: Within normal limits. Adrenals/Urinary Tract: Adrenal glands are within normal limits. 2 mm nonobstructing right upper pole renal calculus (series 2/image 33). Subcentimeter interpolar left renal cyst (series 2/image 36). No hydronephrosis. Bladder is within normal limits. Stomach/Bowel: Stomach is notable for a tiny hiatal hernia. No evidence of bowel obstruction. Normal appendix (series 2/image 89). No colonic wall thickening or inflammatory changes. Vascular/Lymphatic: No evidence of abdominal aortic aneurysm. Atherosclerotic calcifications of the abdominal aorta and branch vessels. No suspicious abdominopelvic lymphadenopathy. Reproductive: Prostate is unremarkable. Other: No abdominopelvic ascites. Musculoskeletal: Mild degenerative changes of the lower lumbar spine. IMPRESSION: Mild uncomplicated acute pancreatitis. Electronically Signed   By: Julian Hy M.D.   On: 05/07/2021 03:59     Pertinent labs & imaging results that were available during my care of the patient were reviewed by me and considered in my medical decision making (see MDM for details).  Medications Ordered in ED Medications  sodium chloride 0.9 % bolus 1,000 mL (0 mLs Intravenous Stopped 05/07/21 0410)    Followed by  0.9 %  sodium chloride infusion (0 mLs Intravenous Stopped 05/07/21 0550)  HYDROmorphone (DILAUDID) injection 0.5 mg (0.5 mg Intravenous Given 05/07/21 0138)  iohexol (OMNIPAQUE) 350 MG/ML injection 80 mL (80 mLs Intravenous Contrast Given 05/07/21 0325)  HYDROmorphone (DILAUDID) injection 0.5 mg (0.5 mg Intravenous Given 05/07/21 0407)  Procedures Procedures  (including critical care time)  Medical Decision Making / ED Course I have reviewed the nursing notes for this encounter and the patient's prior records (if available in EHR or on provided paperwork).  Dean Glass was evaluated in Emergency Department on 05/07/2021 for the symptoms described in the history of present illness. He was evaluated in the context of the global COVID-19 pandemic, which necessitated consideration that the patient might be at risk for infection with the SARS-CoV-2 virus that causes COVID-19. Institutional protocols and algorithms that pertain to the evaluation of patients at risk for COVID-19 are in a state of rapid change based on information released by regulatory bodies including the CDC and federal and state organizations. These policies and algorithms were followed during the patient's care in the ED.     Epigastric abdominal pain. Work-up notable for mild pancreatitis noted on CT scan. Patient treated symptomatically with IV fluids and pain medicine. Tachycardia improved. Able to tolerate oral intake.  Pain controlled. Appropriate for outpatient  management.  Pertinent labs & imaging results that were available during my care of the patient were reviewed by me and considered in my medical decision making:    Final Clinical Impression(s) / ED Diagnoses Final diagnoses:  Idiopathic acute pancreatitis without infection or necrosis   The patient appears reasonably screened and/or stabilized for discharge and I doubt any other medical condition or other [facility] requiring further screening, evaluation, or treatment in the ED at this time prior to discharge. Safe for discharge with strict return precautions.  Disposition: Discharge  Condition: Good  I have discussed the results, Dx and Tx plan with the patient/family who expressed understanding and agree(s) with the plan. Discharge instructions discussed at length. The patient/family was given strict return precautions who verbalized understanding of the instructions. No further questions at time of discharge.    ED Discharge Orders          Ordered    ondansetron (ZOFRAN ODT) 4 MG disintegrating tablet  Every 8 hours PRN        05/07/21 0537    oxyCODONE (ROXICODONE) 5 MG immediate release tablet  Every 6 hours PRN        05/07/21 5537             Follow Up: Cassandria Anger, MD [place] [address] [phone]  Call  to schedule an appointment for close follow up     This chart was dictated using voice recognition software.  Despite best efforts to proofread,  errors can occur which can change the documentation meaning.    Fatima Blank, MD 05/07/21 [phone]

## 2021-05-11 ENCOUNTER — Other Ambulatory Visit: Payer: Self-pay

## 2021-05-11 ENCOUNTER — Ambulatory Visit (INDEPENDENT_AMBULATORY_CARE_PROVIDER_SITE_OTHER): Payer: 59 | Admitting: Internal Medicine

## 2021-05-11 ENCOUNTER — Encounter: Payer: Self-pay | Admitting: Internal Medicine

## 2021-05-11 ENCOUNTER — Ambulatory Visit: Payer: 59 | Admitting: Internal Medicine

## 2021-05-11 DIAGNOSIS — E1159 Type 2 diabetes mellitus with other circulatory complications: Secondary | ICD-10-CM | POA: Insufficient documentation

## 2021-05-11 DIAGNOSIS — K8500 Idiopathic acute pancreatitis without necrosis or infection: Secondary | ICD-10-CM | POA: Diagnosis not present

## 2021-05-11 DIAGNOSIS — K859 Acute pancreatitis without necrosis or infection, unspecified: Secondary | ICD-10-CM | POA: Insufficient documentation

## 2021-05-11 DIAGNOSIS — I2510 Atherosclerotic heart disease of native coronary artery without angina pectoris: Secondary | ICD-10-CM

## 2021-05-11 DIAGNOSIS — E1169 Type 2 diabetes mellitus with other specified complication: Secondary | ICD-10-CM | POA: Diagnosis not present

## 2021-05-11 DIAGNOSIS — E119 Type 2 diabetes mellitus without complications: Secondary | ICD-10-CM | POA: Insufficient documentation

## 2021-05-11 DIAGNOSIS — I2583 Coronary atherosclerosis due to lipid rich plaque: Secondary | ICD-10-CM | POA: Diagnosis not present

## 2021-05-11 MED ORDER — ONETOUCH VERIO VI STRP: ORAL_STRIP | 5 refills | Status: DC

## 2021-05-11 MED ORDER — ONETOUCH ULTRASOFT LANCETS MISC: 5 refills | Status: AC

## 2021-05-11 MED ORDER — ONETOUCH VERIO W/DEVICE KIT: 1.0000 [IU] | PACK | Freq: Every day | 1 refills | Status: AC | PRN

## 2021-05-11 MED ORDER — PANCRELIPASE (LIP-PROT-AMYL) 36000-114000 UNITS PO CPEP: 36000.0000 [IU] | ORAL_CAPSULE | Freq: Three times a day (TID) | ORAL | 3 refills | Status: DC

## 2021-05-11 NOTE — Progress Notes (Signed)
Subjective:  Patient ID: Dean Glass, male    DOB: [birth year]  Age: 45 y.o. MRN: [id number]  CC: Follow-up [facility] f/u)   HPI NOBUO NUNZIATA presents for post ER f/u on 05/06/21 - acute pancreatitis, abdominal pain. H/o mild episodes of similar abd pain x 8 years  Per ER: "Epigastric abdominal pain. Work-up notable for mild pancreatitis noted on CT scan. Patient treated symptomatically with IV fluids and pain medicine. Tachycardia improved. Able to tolerate oral intake.  Pain controlled. Appropriate for outpatient management."  Outpatient Medications Prior to Visit  Medication Sig Dispense Refill   atorvastatin (LIPITOR) 10 MG tablet TAKE 1 TABLET (10 MG TOTAL) BY MOUTH DAILY. (Patient taking differently: Take 10 mg by mouth daily.) 90 tablet 3   Cholecalciferol (VITAMIN D3) 50 MCG (2000 UT) capsule Take 1 capsule (2,000 Units total) by mouth daily. 100 capsule 3   meloxicam (MOBIC) 15 MG tablet TAKE 1 TABLET (15 MG TOTAL) BY MOUTH DAILY. (Patient taking differently: Take 15 mg by mouth daily as needed for pain.) 30 tablet 2   metFORMIN (GLUCOPHAGE) 500 MG tablet Take 1 tablet (500 mg total) by mouth daily with breakfast. 90 tablet 3   pantoprazole (PROTONIX) 40 MG tablet Take 1 tablet (40 mg total) by mouth daily for 10 days. 10 tablet 0   zinc gluconate 50 MG tablet Take 50 mg by mouth daily.     oxyCODONE (ROXICODONE) 5 MG immediate release tablet Take 0.5-1 tablets (2.5-5 mg total) by mouth every 6 (six) hours as needed for up to 5 days for severe pain. (Patient not taking: Reported on 05/11/2021) 20 tablet 0   No facility-administered medications prior to visit.    ROS: Review of Systems  Constitutional:  Negative for appetite change, fatigue and unexpected weight change.  HENT:  Negative for congestion, nosebleeds, sneezing, sore throat and trouble swallowing.   Eyes:  Negative for itching and visual disturbance.  Respiratory:  Negative for cough.   Cardiovascular:   Negative for chest pain, palpitations and leg swelling.  Gastrointestinal:  Positive for abdominal distention and abdominal pain. Negative for blood in stool, diarrhea and nausea.  Genitourinary:  Negative for frequency and hematuria.  Musculoskeletal:  Negative for back pain, gait problem, joint swelling and neck pain.  Skin:  Negative for rash.  Neurological:  Negative for dizziness, tremors, speech difficulty and weakness.  Psychiatric/Behavioral:  Negative for agitation, dysphoric mood and sleep disturbance. The patient is not nervous/anxious.    Objective:  BP 120/78 (BP Location: Left Arm)   Pulse (!) 101   Temp 98.2 F (36.8 C) (Oral)   Ht _0  (1.702 m)   Wt 201 lb 6.4 oz (91.4 kg)   SpO2 97%   BMI 31.54 kg/m   BP Readings from Last 3 Encounters:  05/11/21 120/78  05/07/21 139/88  05/06/21 132/80    Wt Readings from Last 3 Encounters:  05/11/21 201 lb 6.4 oz (91.4 kg)  05/06/21 205 lb (93 kg)  05/06/21 205 lb (93 kg)    Physical Exam Constitutional:      General: He is not in acute distress.    Appearance: He is well-developed.     Comments: NAD  Eyes:     Conjunctiva/sclera: Conjunctivae normal.     Pupils: Pupils are equal, round, and reactive to light.  Neck:     Thyroid: No thyromegaly.     Vascular: No JVD.  Cardiovascular:     Rate and Rhythm: Normal rate and  regular rhythm.     Heart sounds: Normal heart sounds. No murmur heard.   No friction rub. No gallop.  Pulmonary:     Effort: Pulmonary effort is normal. No respiratory distress.     Breath sounds: Normal breath sounds. No wheezing or rales.  Chest:     Chest wall: No tenderness.  Abdominal:     General: Bowel sounds are normal. There is no distension.     Palpations: Abdomen is soft. There is no mass.     Tenderness: no abdominal tenderness There is no guarding or rebound.  Musculoskeletal:        General: No tenderness. Normal range of motion.     Cervical back: Normal range of motion.   Lymphadenopathy:     Cervical: No cervical adenopathy.  Skin:    General: Skin is warm and dry.     Findings: No rash.  Neurological:     Mental Status: He is alert and oriented to person, place, and time.     Cranial Nerves: No cranial nerve deficit.     Motor: No abnormal muscle tone.     Coordination: Coordination normal.     Gait: Gait normal.     Deep Tendon Reflexes: Reflexes are normal and symmetric.  Psychiatric:        Behavior: Behavior normal.        Thought Content: Thought content normal.        Judgment: Judgment normal.    Lab Results  Component Value Date   WBC 14.8 (H) 05/06/2021   HGB 14.7 05/06/2021   HCT 43.0 05/06/2021   PLT 266 05/06/2021   GLUCOSE 188 (H) 05/06/2021   CHOL 136 03/31/2021   TRIG 120.0 03/31/2021   HDL 37.80 (L) 03/31/2021   LDLCALC 74 03/31/2021   ALT 22 05/06/2021   AST 16 05/06/2021   NA 135 05/06/2021   K 3.8 05/06/2021   CL 103 05/06/2021   CREATININE 0.72 05/06/2021   BUN 9 05/06/2021   CO2 22 05/06/2021   TSH 2.27 03/31/2021   PSA 1.46 09/04/2019   HGBA1C 7.0 (H) 03/31/2021    CT ABDOMEN PELVIS W CONTRAST  Result Date: 05/07/2021 CLINICAL DATA:  Upper abdominal pain x3 days, elevated lipase EXAM: CT ABDOMEN AND PELVIS WITH CONTRAST TECHNIQUE: Multidetector CT imaging of the abdomen and pelvis was performed using the standard protocol following bolus administration of intravenous contrast. CONTRAST:  83m OMNIPAQUE IOHEXOL 350 MG/ML SOLN COMPARISON:  None. FINDINGS: Lower chest: Mild atelectasis in the lingula and bilateral lower lobes. Hepatobiliary: Liver is within normal limits. Gallbladder is unremarkable. No intrahepatic or extrahepatic duct dilatation. Pancreas: Mild peripancreatic inflammatory changes along the pancreatic body/tail, reflecting acute pancreatitis. No pancreatic atrophy or ductal dilatation. Spleen: Within normal limits. Adrenals/Urinary Tract: Adrenal glands are within normal limits. 2 mm nonobstructing  right upper pole renal calculus (series 2/image 33). Subcentimeter interpolar left renal cyst (series 2/image 36). No hydronephrosis. Bladder is within normal limits. Stomach/Bowel: Stomach is notable for a tiny hiatal hernia. No evidence of bowel obstruction. Normal appendix (series 2/image 89). No colonic wall thickening or inflammatory changes. Vascular/Lymphatic: No evidence of abdominal aortic aneurysm. Atherosclerotic calcifications of the abdominal aorta and branch vessels. No suspicious abdominopelvic lymphadenopathy. Reproductive: Prostate is unremarkable. Other: No abdominopelvic ascites. Musculoskeletal: Mild degenerative changes of the lower lumbar spine. IMPRESSION: Mild uncomplicated acute pancreatitis. Electronically Signed   By: SJulian HyM.D.   On: 05/07/2021 03:59    Assessment & Plan:   Problem  List Items Addressed This Visit     Acute pancreatitis    05/06/21 - acute recurrent pancreatitis, abdominal pain. H/o mild episodes of similar abd pain x 10 years. A mild pancreatitis noted on CT scan. ?aggravated by Lipitor or Metformin - hold both x 2 weeks, then re-start Creon w/large meals GI ref      Relevant Medications   lipase/protease/amylase (CREON) 36000 UNITS CPEP capsule   Other Relevant Orders   Ambulatory referral to Gastroenterology   Coronary atherosclerosis    Pancreatitis ?aggravated by Lipitor- hold  x 2 weeks, then re-start GI ref      Diabetes ([place])    Pancreatitis ?aggravated by Metformin- hold  x 2 weeks, then re-start GI ref         Meds ordered this encounter  Medications   Blood Glucose Monitoring Suppl (ONETOUCH VERIO) w/Device KIT    Sig: 1 Units by Does not apply route daily as needed.    Dispense:  1 kit    Refill:  1   glucose blood (ONETOUCH VERIO) test strip    Sig: Use as instructed    Dispense:  50 each    Refill:  5   Lancets (ONETOUCH ULTRASOFT) lancets    Sig: Use as instructed    Dispense:  100 each    Refill:  5    DISCONTD: lipase/protease/amylase (CREON) 36000 UNITS CPEP capsule    Sig: Take 1 capsule (36,000 Units total) by mouth 3 (three) times daily before meals. Take with the first bite of food    Dispense:  180 capsule    Refill:  3   lipase/protease/amylase (CREON) 36000 UNITS CPEP capsule    Sig: Take 1 capsule (36,000 Units total) by mouth 3 (three) times daily before meals. Take with the first bite of food    Dispense:  180 capsule    Refill:  3      Follow-up: Return in about 3 months (around 08/11/2021) for a follow-up visit.  Walker Kehr, MD

## 2021-05-11 NOTE — Assessment & Plan Note (Addendum)
05/06/21 - acute recurrent pancreatitis, abdominal pain. H/o mild episodes of similar abd pain x 10 years. A mild pancreatitis noted on CT scan. ?aggravated by Lipitor or Metformin - hold both x 2 weeks, then re-start Creon w/large meals GI ref

## 2021-05-11 NOTE — Patient Instructions (Signed)
Chronic Pancreatitis Chronic pancreatitis is long-lasting inflammation and scarring of the pancreas. The pancreas is a gland that is located behind the stomach. It makes enzymes that help to digest food. The pancreas also releases hormones called glucagon and insulin, which help regulate blood sugar (glucose). Damage to the pancreas may affect digestion, cause pain in the upper abdomen and back, and may cause diabetes. Inflammation can also irritate other organs in the abdomen near the pancreas. At first, pancreatitis may be sudden (acute). If you have several or prolonged episodes of acute pancreatitis, the condition can turn into chronic pancreatitis. What are the causes? The most common cause of this condition is alcohol abuse. Other causes include: High (elevated) levels of triglycerides in the blood (hypertriglyceridemia). Gallstones or other conditions that can block the tube that drains the pancreas (pancreatic duct). Pancreatic cancer. Cystic fibrosis. Too much calcium in the blood (hypercalcemia), which may be caused by an overactive parathyroid gland (hyperparathyroidism). Certain medicines. Injury to the pancreas. Infection. Autoimmune pancreatitis. This is when the body's disease-fighting (immune) system attacks the pancreas. Genes that are passed from parent to child (inherited). In some cases, the cause may not be known. What increases the risk? This condition is more likely to develop in: Men. People who are 78-41 years old. People who have a family history of pancreatitis. People who smoke tobacco. People who drink large amounts of alcohol over a long period of time. What are the signs or symptoms? Symptoms of this condition may include: Pain in the abdomen or upper back. Pain may get worse after eating. Nausea and vomiting. Fever. Weight loss. A change in the color and consistency of bowel movements, such as stools that are oily, fatty, or clay-colored. How is this  diagnosed? This condition is diagnosed based on your symptoms, your medical history, and a physical exam. You may have tests, such as: Blood tests. Stool samples. Biopsy of the pancreas. This is the removal of a small amount of pancreas tissue to be tested in a lab. Imaging tests, such as: X-rays. CT scan. MRI. Ultrasound. How is this treated? You may need to be treated at a hospital. Treatment may involve: Resting the pancreas. You may need to stop eating and drinking for a few days to give your pancreas time to recover. During this time, you will be given IV fluids to keep you hydrated. Controlling pain. You may be given pain medicines by mouth (orally) or as injections. Improving digestion. You may be given: Medicines to replace your pancreatic enzymes. Vitamin supplements. A specific diet to follow. You may work with a diet and nutrition specialist (dietitian) to make an eating plan. Surgery to: Clear the pancreatic ducts of any blockages, such as gallstones. Remove any fluid or damaged tissue from the pancreas. Other treatments may include: Preventing diabetes. Your health care provider may recommend that you: Get regular screening tests for diabetes. Monitor your blood glucose regularly. Lifestyle changes, such as stopping alcohol use. Steroid medicines, if your condition is caused by your immune system attacking your body's own tissues (autoimmune disease). Follow these instructions at home: Eating and drinking   Do not drink alcohol. If you need help quitting, ask your health care provider. Follow a diet as told by your health care provider or dietitian, if this applies. This may include: Limiting how much fat you eat. Eating smaller meals more often. Avoiding caffeine. Drink enough fluid to keep your urine pale yellow. General instructions Take over-the-counter and prescription medicines only as told by  your health care provider. These include vitamin supplements. Do  not drive or use heavy machinery while taking prescription pain medicine. If you are taking prescription pain medicine, take actions to prevent or treat constipation. Your health care provider may recommend that you: Take an over-the-counter or prescription medicine for constipation. Eat foods that are high in fiber such as whole grains and beans. Limit foods that are high in fat and processed sugars, such as fried or sweet foods. Do not use any products that contain nicotine or tobacco, such as cigarettes and e-cigarettes. If you need help quitting, ask your health care provider. If recommended by your health care provider, monitor your blood glucose at home. Keep all follow-up visits as told by your health care provider. This is important. Contact a health care provider if: You have pain that does not get better with medicine. You have a fever. You have sudden weight loss. Get help right away if: Your pain suddenly gets worse. You have sudden swelling in your abdomen. You start to vomit often. You have diarrhea that does not go away. You vomit blood or have blood in your stool. You become confused or you have trouble thinking clearly. These symptoms may represent a serious problem that is an emergency. Do not wait to see if the symptoms will go away. Get medical help right away. Call your local emergency services (911 in the [place] Do not drive yourself to the hospital. Summary Chronic pancreatitis is long-lasting inflammation and scarring of the pancreas. Damage to the pancreas may affect digestion, cause pain in the upper abdomen and back, and may cause diabetes. Inflammation can also irritate other organs in the abdomen near the pancreas. Common causes of this condition are alcohol abuse, gallstones, high (elevated) levels of triglycerides, and certain medicines. This condition is sometimes treated at a hospital and may involve resting the pancreas, controlling pain, replacing enzymes,  and avoiding alcohol. This information is not intended to replace advice given to you by your health care provider. Make sure you discuss any questions you have with your health care provider. Document Revised: 03/25/2020 Document Reviewed: 03/27/2020 Elsevier Patient Education  [place].

## 2021-05-11 NOTE — Assessment & Plan Note (Signed)
Pancreatitis ?aggravated by Lipitor- hold  x 2 weeks, then re-start GI ref

## 2021-05-11 NOTE — Assessment & Plan Note (Signed)
Pancreatitis ?aggravated by Metformin- hold  x 2 weeks, then re-start GI ref

## 2021-05-20 ENCOUNTER — Encounter: Payer: Self-pay | Admitting: Gastroenterology

## 2021-05-20 ENCOUNTER — Encounter: Payer: Self-pay | Admitting: Internal Medicine

## 2021-07-06 ENCOUNTER — Ambulatory Visit (INDEPENDENT_AMBULATORY_CARE_PROVIDER_SITE_OTHER): Payer: 59 | Admitting: Gastroenterology

## 2021-07-06 ENCOUNTER — Encounter: Payer: Self-pay | Admitting: Gastroenterology

## 2021-07-06 ENCOUNTER — Other Ambulatory Visit (INDEPENDENT_AMBULATORY_CARE_PROVIDER_SITE_OTHER): Payer: 59

## 2021-07-06 VITALS — BP 110/66 | HR 67 | Ht 68.0 in | Wt 202.0 lb

## 2021-07-06 DIAGNOSIS — K8500 Idiopathic acute pancreatitis without necrosis or infection: Secondary | ICD-10-CM

## 2021-07-06 LAB — COMPREHENSIVE METABOLIC PANEL
ALT: 29 U/L (ref 0–53)
AST: 19 U/L (ref 0–37)
Albumin: 4.5 g/dL (ref 3.5–5.2)
Alkaline Phosphatase: 56 U/L (ref 39–117)
BUN: 12 mg/dL (ref 6–23)
CO2: 24 mEq/L (ref 19–32)
Calcium: 9.2 mg/dL (ref 8.4–10.5)
Chloride: 104 mEq/L (ref 96–112)
Creatinine, Ser: 0.89 mg/dL (ref 0.40–1.50)
GFR: 103.09 mL/min (ref >60.00)
Glucose, Bld: 114 mg/dL — ABNORMAL HIGH (ref 70–99)
Potassium: 4 mEq/L (ref 3.5–5.1)
Sodium: 138 mEq/L (ref 135–145)
Total Bilirubin: 1 mg/dL (ref 0.2–1.2)
Total Protein: 7.5 g/dL (ref 6.0–8.3)

## 2021-07-06 LAB — CBC
HCT: 42.8 % (ref 39.0–52.0)
Hemoglobin: 14.4 g/dL (ref 13.0–17.0)
MCHC: 33.7 g/dL (ref 30.0–36.0)
MCV: 89.1 fl (ref 78.0–100.0)
Platelets: 295 10*3/uL (ref 150.0–400.0)
RBC: 4.8 Mil/uL (ref 4.22–5.81)
RDW: 13.1 % (ref 11.5–15.5)
WBC: 9.2 10*3/uL (ref 4.0–10.5)

## 2021-07-06 LAB — LIPASE: Lipase: 10 U/L — ABNORMAL LOW (ref 11.0–59.0)

## 2021-07-06 LAB — AMYLASE: Amylase: 25 U/L — ABNORMAL LOW (ref 27–131)

## 2021-07-06 NOTE — Patient Instructions (Addendum)
If you are age 47 or younger, your body mass index should be between 19-25. Your Body mass index is 30.71 kg/m. If this is out of the aformentioned range listed, please consider follow up with your Primary Care Provider.  ________________________________________________________  The [place] [facility] providers would like to encourage you to use [facility] to communicate with providers for non-urgent requests or questions.  Due to long hold times on the telephone, sending your provider a message by [facility] may be a faster and more efficient way to get a response.  Please allow 48 business hours for a response.  Please remember that this is for non-urgent requests.  _______________________________________________________  Your provider has requested that you go to the basement level for lab work before leaving today. Press "B" on the elevator. The lab is located at the first door on the left as you exit the elevator.  You have been scheduled for an abdominal ultrasound at [facility] (1st floor of hospital) on 07/10/21 at 8:30am Please arrive 15 minutes prior to your appointment for registration. Make certain not to have anything to eat or drink after midnight the night prior to your appointment. Should you need to reschedule your appointment, please contact radiology at [phone]. This test typically takes about 30 minutes to perform.  Due to recent changes in healthcare laws, you may see the results of your imaging and laboratory studies on MyChart before your provider has had a chance to review them.  We understand that in some cases there may be results that are confusing or concerning to you. Not all laboratory results come back in the same time frame and the provider may be waiting for multiple results in order to interpret others.  Please give [place] 48 hours in order for your provider to thoroughly review all the results before contacting the office for clarification of your results.   Thank you for  entrusting me with your care and choosing [facility].  Dr Ardis Hughs

## 2021-07-06 NOTE — Progress Notes (Signed)
HPI: This is a very pleasant 46 year old man who was referred to me by Plotnikov, Evie Lacks, MD  to evaluate recent mild acute pancreatitis.    He had some epigastric pain, nausea without vomiting.  He eventually presented to the emergency room for testing.  Testing is all below.  He was told that he might have eaten something that caused this.  He followed up with his primary care physician several days later and labs were repeated.  He modified his diet to liquids for 2 to 3 days and was back to normal within 3 or 4 days completely.  He has never had problems that he knows of with his pancreas in the past.  He did have a abdominal pain episode 12 to 14 years ago that was somewhat similar but he never sought medical attention for it.  Pancreas problems do not run in his family  He is on an herbal supplement called "balance of nature which he has been on for 6 or 7 months prior to this episode.   He does not drink alcohol at all or only just very rarely maybe 1 or 2 drinks per month tops.  He lost about 10 pounds during this episode and that has stabilized.  Old Data Reviewed: Blood work November 2022 showed total bilirubin of 1.8 and 1.6 sequentially remaining liver tests and complete metabolic profile was normal ketones and protein was found in his urine, his lipase was 98 and then repeat showed 66 1 day later.  His white count was 13.9 and repeat showed 14.8.   CT scan abdomen pelvis with IV and oral contrast indication "upper abdominal pain x3 days, elevated lipase".  Findings gallbladder was normal.  No biliary dilation.  There was "mild peripancreatic inflammatory changes along the pancreatic body/tail, reflecting acute pancreatitis.  No pancreatic atrophy or duct dilation."    Review of systems: Pertinent positive and negative review of systems were noted in the above HPI section. All other review negative.   Past Medical History:  Diagnosis Date   GERD (gastroesophageal reflux  disease)    Seasonal allergies     Past Surgical History:  Procedure Laterality Date   VASECTOMY      Current Outpatient Medications  Medication Sig Dispense Refill   aspirin EC 81 MG tablet Take 81 mg by mouth daily. Swallow whole.     atorvastatin (LIPITOR) 10 MG tablet TAKE 1 TABLET (10 MG TOTAL) BY MOUTH DAILY. (Patient taking differently: Take 10 mg by mouth daily.) 90 tablet 3   Blood Glucose Monitoring Suppl (ONETOUCH VERIO) w/Device KIT 1 Units by Does not apply route daily as needed. 1 kit 1   Cholecalciferol (VITAMIN D3) 50 MCG (2000 UT) capsule Take 1 capsule (2,000 Units total) by mouth daily. 100 capsule 3   glucose blood (ONETOUCH VERIO) test strip Use as instructed 50 each 5   Lancets (ONETOUCH ULTRASOFT) lancets Use as instructed 100 each 5   meloxicam (MOBIC) 15 MG tablet TAKE 1 TABLET (15 MG TOTAL) BY MOUTH DAILY. (Patient taking differently: Take 15 mg by mouth daily as needed for pain.) 30 tablet 2   metFORMIN (GLUCOPHAGE) 500 MG tablet Take 1 tablet (500 mg total) by mouth daily with breakfast. 90 tablet 3   zinc gluconate 50 MG tablet Take 50 mg by mouth daily.     No current facility-administered medications for this visit.    Allergies as of 07/06/2021   (No Known Allergies)    Family History  Problem  Relation Age of Onset   Diabetes Mother    Coronary artery disease Father    Heart disease Father        cad   Early death Brother 51       cystic kidney    Social History   Socioeconomic History   Marital status: Married    Spouse name: Not on file   Number of children: Not on file   Years of education: Not on file   Highest education level: Not on file  Occupational History   Not on file  Tobacco Use   Smoking status: Former    Packs/day: 0.50    Types: Cigarettes    Quit date: 03/13/2015    Years since quitting: 6.3   Smokeless tobacco: Former  Substance and Sexual Activity   Alcohol use: Yes    Alcohol/week: 0.0 standard drinks     Comment: Social   Drug use: No   Sexual activity: Yes  Other Topics Concern   Not on file  Social History Narrative   Not on file   Social Determinants of Health   Financial Resource Strain: Not on file  Food Insecurity: Not on file  Transportation Needs: Not on file  Physical Activity: Not on file  Stress: Not on file  Social Connections: Not on file  Intimate Partner Violence: Not on file     Physical Exam: BP 110/66    Pulse 67    Ht 5' 8"  (1.727 m)    Wt 202 lb (91.6 kg)    BMI 30.71 kg/m  Constitutional: generally well-appearing Psychiatric: alert and oriented x3 Eyes: extraocular movements intact Mouth: oral pharynx moist, no lesions Neck: supple no lymphadenopathy Cardiovascular: heart regular rate and rhythm Lungs: clear to auscultation bilaterally Abdomen: soft, nontender, nondistended, no obvious ascites, no peritoneal signs, normal bowel sounds Extremities: no lower extremity edema bilaterally Skin: no lesions on visible extremities   Assessment and plan: 46 y.o. male with mild acute pancreatitis unclear etiology  His symptoms completely resolved after just 3 to 4 days and he has been absolutely back to normal for 2 or 3 months now.  He does drink nearly half alcohol to have caused something like this.  I recommended abdominal ultrasound to better prove whether or not he has gallstones in his gallbladder since it is such a common cause and ultrasounds are generally better at detecting gallstones than CAT scans.  I am repeating some blood work on him including CBC, complete metabolic profile, pancreatic enzymes.  It is interesting that his total bilirubin was 1.8, 1.9.  Perhaps he has gilbert's syndrome.  Perhaps this is a sign of something else going on or that he did indeed have gallstone related pancreatitis.  He understands that if the ultrasound and blood tests are essentially normal then I would recommend no further testing at this point unless he has a  second episode of pancreatitis and then at that point I would probably recommend further blood work, imaging, possibly MRI with MRCP  Please see the "Patient Instructions" section for addition details about the plan.   Owens Loffler, MD [facility] 07/06/2021, 1:39 PM  Cc: Cassandria Anger, MD  Total time on date of encounter was 45  minutes (this included time spent preparing to see the patient reviewing records; obtaining and/or reviewing separately obtained history; performing a medically appropriate exam and/or evaluation; counseling and educating the patient and family if present; ordering medications, tests or procedures if applicable; and documenting clinical  information in the health record).

## 2021-07-10 ENCOUNTER — Other Ambulatory Visit: Payer: Self-pay

## 2021-07-10 ENCOUNTER — Ambulatory Visit (HOSPITAL_COMMUNITY)
Admission: RE | Admit: 2021-07-10 | Discharge: 2021-07-10 | Disposition: A | Discharge status: Home / Self Care | Payer: 59 | Source: Ambulatory Visit | Attending: Gastroenterology | Admitting: Gastroenterology

## 2021-07-10 DIAGNOSIS — K8500 Idiopathic acute pancreatitis without necrosis or infection: Secondary | ICD-10-CM | POA: Insufficient documentation

## 2021-07-15 ENCOUNTER — Other Ambulatory Visit: Payer: Self-pay

## 2021-07-15 ENCOUNTER — Telehealth: Payer: Self-pay

## 2021-07-15 ENCOUNTER — Other Ambulatory Visit (INDEPENDENT_AMBULATORY_CARE_PROVIDER_SITE_OTHER): Payer: 59

## 2021-07-15 ENCOUNTER — Encounter: Payer: Self-pay | Admitting: Gastroenterology

## 2021-07-15 DIAGNOSIS — R109 Unspecified abdominal pain: Secondary | ICD-10-CM

## 2021-07-15 LAB — COMPREHENSIVE METABOLIC PANEL
ALT: 20 U/L (ref 0–53)
AST: 13 U/L (ref 0–37)
Albumin: 4.3 g/dL (ref 3.5–5.2)
Alkaline Phosphatase: 58 U/L (ref 39–117)
BUN: 10 mg/dL (ref 6–23)
CO2: 25 mEq/L (ref 19–32)
Calcium: 9 mg/dL (ref 8.4–10.5)
Chloride: 103 mEq/L (ref 96–112)
Creatinine, Ser: 0.85 mg/dL (ref 0.40–1.50)
GFR: 104.52 mL/min (ref >60.00)
Glucose, Bld: 114 mg/dL — ABNORMAL HIGH (ref 70–99)
Potassium: 3.6 mEq/L (ref 3.5–5.1)
Sodium: 137 mEq/L (ref 135–145)
Total Bilirubin: 0.8 mg/dL (ref 0.2–1.2)
Total Protein: 7.3 g/dL (ref 6.0–8.3)

## 2021-07-15 LAB — CBC WITH DIFFERENTIAL/PLATELET
Basophils Absolute: 0 10*3/uL (ref 0.0–0.1)
Basophils Relative: 0.3 % (ref 0.0–3.0)
Eosinophils Absolute: 0.1 10*3/uL (ref 0.0–0.7)
Eosinophils Relative: 0.9 % (ref 0.0–5.0)
HCT: 43 % (ref 39.0–52.0)
Hemoglobin: 14.4 g/dL (ref 13.0–17.0)
Lymphocytes Relative: 30.6 % (ref 12.0–46.0)
Lymphs Abs: 3.8 10*3/uL (ref 0.7–4.0)
MCHC: 33.4 g/dL (ref 30.0–36.0)
MCV: 88.8 fl (ref 78.0–100.0)
Monocytes Absolute: 0.9 10*3/uL (ref 0.1–1.0)
Monocytes Relative: 7.5 % (ref 3.0–12.0)
Neutro Abs: 7.5 10*3/uL (ref 1.4–7.7)
Neutrophils Relative %: 60.7 % (ref 43.0–77.0)
Platelets: 284 10*3/uL (ref 150.0–400.0)
RBC: 4.84 Mil/uL (ref 4.22–5.81)
RDW: 13.2 % (ref 11.5–15.5)
WBC: 12.4 10*3/uL — ABNORMAL HIGH (ref 4.0–10.5)

## 2021-07-15 LAB — AMYLASE: Amylase: 546 U/L — ABNORMAL HIGH (ref 27–131)

## 2021-07-15 LAB — LIPASE: Lipase: >600 U/L — ABNORMAL HIGH (ref 11.0–59.0)

## 2021-07-15 NOTE — Telephone Encounter (Signed)
Dr Ardis Hughs the lab called with a critical lipase of 1,188 u/l.

## 2021-07-16 ENCOUNTER — Other Ambulatory Visit: Payer: Self-pay

## 2021-07-16 ENCOUNTER — Telehealth: Payer: Self-pay | Admitting: Gastroenterology

## 2021-07-16 DIAGNOSIS — K859 Acute pancreatitis without necrosis or infection, unspecified: Secondary | ICD-10-CM

## 2021-07-16 DIAGNOSIS — R109 Unspecified abdominal pain: Secondary | ICD-10-CM

## 2021-07-16 MED ORDER — HYDROCODONE-ACETAMINOPHEN 7.5-325 MG PO TABS: 1.0000 | ORAL_TABLET | Freq: Four times a day (QID) | ORAL | 0 refills | Status: DC | PRN

## 2021-07-16 MED ORDER — ONDANSETRON HCL 4 MG PO TABS: 4.0000 mg | ORAL_TABLET | Freq: Two times a day (BID) | ORAL | 1 refills | Status: DC | PRN

## 2021-07-16 NOTE — Telephone Encounter (Signed)
Pt scheduled for OV on 07/30/21 at 1:30 with Nicoletta Ba, PA., and [facility] referral made. Zofran prescription and lab ordered. Work note sent to pt.   Dr. Ardis Hughs, we are not able to order narcotics on our end. Are you able to place the Hydrocodone order? Thank you.

## 2021-07-16 NOTE — Addendum Note (Signed)
Addended by: Milus Banister on: 07/16/2021 09:00 AM   Modules accepted: Orders

## 2021-07-16 NOTE — Telephone Encounter (Signed)
Patient called and stated that pharmacy is waiting on a response from [place] to fill his hydrocodone proscription. Please advise.

## 2021-07-16 NOTE — Telephone Encounter (Signed)
Spoke with pt & he is aware that he will need to come in for lab work and that both of his medications have been sent to his pharmacy. No further questions.

## 2021-07-16 NOTE — Telephone Encounter (Signed)
Called two of the pt's numbers listed, and unable to leave a vm. Will reattempt later today.

## 2021-07-16 NOTE — Telephone Encounter (Signed)
I WAS able to call it in, my impravata is working again.

## 2021-07-16 NOTE — Telephone Encounter (Signed)
Dr. Eugenia Pancoast see below, PA was denied for the hydrocodone that you ordered earlier today for this patient.  [address] [place], [place] [id number] RE: Denial of request for coverage of Hydrocodone-Acetaminophen Tab 7.5-325 mg Dear Lanny Hurst [place]: You or your doctor recently requested prescription coverage for Hydrocodone-Acetaminophen Tab 7.5-325 mg. After careful consideration and review of your prescription plan's drug list and the information sent to [place], this request was not approved. We understand that this decision may not be what you and your doctor expected. This letter and the enclosed information will explain your options and help you decide what to do next. Why your request was denied: You do not meet the requirements of your plan. Your plan covers this drug when you have any of these conditions: - Pain due to cancer, sickle cell disease, or a terminal condition - Pain being managed through hospice or palliative care - Moderate to severe chronic pain that requires treatment with an opioid - Moderate to severe acute pain that requires treatment with an opioid for more than seven days Your request has been denied based on the information we have.

## 2021-07-17 ENCOUNTER — Other Ambulatory Visit: Payer: 59

## 2021-07-17 DIAGNOSIS — K859 Acute pancreatitis without necrosis or infection, unspecified: Secondary | ICD-10-CM

## 2021-07-17 NOTE — Telephone Encounter (Signed)
Spoke with pt about needing to contact insurance company regarding narcotic medication coverage. He stated that he paid for the hydrocodone yesterday out of pocket. No further questions.

## 2021-07-20 ENCOUNTER — Encounter: Payer: Self-pay | Admitting: Internal Medicine

## 2021-07-21 LAB — IGG 4: IgG, Subclass 4: 58 mg/dL (ref 2–96)

## 2021-07-30 ENCOUNTER — Ambulatory Visit (INDEPENDENT_AMBULATORY_CARE_PROVIDER_SITE_OTHER): Payer: 59 | Admitting: Physician Assistant

## 2021-07-30 ENCOUNTER — Encounter: Payer: Self-pay | Admitting: Physician Assistant

## 2021-07-30 ENCOUNTER — Ambulatory Visit: Payer: 59 | Admitting: Internal Medicine

## 2021-07-30 VITALS — BP 110/70 | HR 88 | Ht 67.5 in | Wt 202.0 lb

## 2021-07-30 DIAGNOSIS — K8500 Idiopathic acute pancreatitis without necrosis or infection: Secondary | ICD-10-CM

## 2021-07-30 NOTE — Progress Notes (Signed)
Subjective:    Patient ID: Dean Glass, male    DOB: 1975-07-06, 46 y.o.   MRN: [id number]  HPI Dean Glass is a pleasant 46 year old white male, recently established with Dr. Christella Hartigan, referred by Dr. Posey Rea after an episode of acute mild pancreatitis.  He had been seen in the emergency room on 05/06/2021 with epigastric pain nausea and vomiting. Lipase was 98, WBC 13.9, LFTs within normal limits with exception of T. bili of 1.8. CT of the abdomen and pelvis with IV and oral contrast showed a normal-appearing gallbladder, no ductal dilation, there were mild peripancreatic inflammatory changes along the pancreatic body and tail and no atrophy or ductal dilation. He saw Dr. Christella Hartigan on 07/06/2021.  Patient symptoms had completely resolved after 3 to 4 days and he was doing well at that time.  There was concern for underlying gallbladder disease and abdominal ultrasound was done which did not show any evidence of gallstones or sludge, CBD of 4 mm was increased echogenicity of the liver consistent with steatosis. Patient does not drink alcohol. Patient then called back on 07/15/2021 with another episode very similar epigastric abdominal pain and nausea which she says was noticed quite as bad as the initial episode.  He went on liquids, took oral analgesic and pain resolved again within 3 to 4 days.  No associated fever, he had some nausea but no vomiting no diarrhea etc.  At this time he is feeling back to normal again.  After he called repeat labs were done showing lipase greater than 600, amylase 546, LFTs within normal limits IgG4 was normal at 58  Decision was made to discontinue Lipitor, and he has been referred to [facility] to consider laparoscopic cholecystectomy.  They are asking today about Glucophage as this had been started in the fall just prior to his initial episode.  He has not been taking it regularly, he has been monitoring his blood sugars at home which have been under good control. He also  has Mobic on his medication list which she says he is not taking and only took that for a very short period of time a while back for an elbow injury.  (Mobic does list pancreatitis as potential adverse reaction)  Review of Systems Pertinent positive and negative review of systems were noted in the above HPI section.  All other review of systems was otherwise negative.   Outpatient Encounter Medications as of 07/30/2021  Medication Sig   aspirin EC 81 MG tablet Take 81 mg by mouth daily. Swallow whole.   Blood Glucose Monitoring Suppl (ONETOUCH VERIO) w/Device KIT 1 Units by Does not apply route daily as needed.   Cholecalciferol (VITAMIN D3) 50 MCG (2000 UT) capsule Take 1 capsule (2,000 Units total) by mouth daily.   glucose blood (ONETOUCH VERIO) test strip Use as instructed   HYDROcodone-acetaminophen (NORCO) 7.5-325 MG tablet Take 1-2 tablets by mouth every 6 (six) hours as needed for moderate pain.   Lancets (ONETOUCH ULTRASOFT) lancets Use as instructed   meloxicam (MOBIC) 15 MG tablet TAKE 1 TABLET (15 MG TOTAL) BY MOUTH DAILY. (Patient taking differently: Take 15 mg by mouth daily as needed for pain.)   metFORMIN (GLUCOPHAGE) 500 MG tablet Take 1 tablet (500 mg total) by mouth daily with breakfast.   ondansetron (ZOFRAN) 4 MG tablet Take 1 tablet (4 mg total) by mouth 2 (two) times daily as needed for nausea or vomiting.   zinc gluconate 50 MG tablet Take 50 mg by mouth daily.   [  DISCONTINUED] atorvastatin (LIPITOR) 10 MG tablet TAKE 1 TABLET (10 MG TOTAL) BY MOUTH DAILY. (Patient not taking: Reported on 07/30/2021)   [DISCONTINUED] fluticasone (FLONASE) 50 MCG/ACT nasal spray Place 2 sprays into both nostrils daily.   No facility-administered encounter medications on file as of 07/30/2021.   Allergies  Allergen Reactions   Atorvastatin     Possible Pancreatitis   Patient Active Problem List   Diagnosis Date Noted   Acute pancreatitis 05/11/2021   Diabetes (HCC) 05/11/2021    Dyslipidemia 09/22/2019   Coronary atherosclerosis 09/21/2019   NEOPLASM OF UNCERTAIN BEHAVIOR OF SKIN 03/31/2010   TOBACCO USER 07/25/2009   Social History   Socioeconomic History   Marital status: Married    Spouse name: Not on file   Number of children: Not on file   Years of education: Not on file   Highest education level: Not on file  Occupational History   Not on file  Tobacco Use   Smoking status: Former    Packs/day: 0.50    Types: Cigarettes    Quit date: 03/13/2015    Years since quitting: 6.3   Smokeless tobacco: Former  Substance and Sexual Activity   Alcohol use: Yes    Alcohol/week: 0.0 standard drinks    Comment: Social   Drug use: No   Sexual activity: Yes  Other Topics Concern   Not on file  Social History Narrative   Not on file   Social Determinants of Health   Financial Resource Strain: Not on file  Food Insecurity: Not on file  Transportation Needs: Not on file  Physical Activity: Not on file  Stress: Not on file  Social Connections: Not on file  Intimate Partner Violence: Not on file    Dean Glass family history includes Coronary artery disease in his father; Diabetes in his mother; Early death (age of onset: 50) in his brother; Heart disease in his father.      Objective:    Vitals:   07/30/21 1321  BP: 110/70  Pulse: 88    Physical Exam Well-developed well-nourished white male in no acute distress.  Patient accompanied by wife  Weight, 202 BMI 31.1  HEENT; nontraumatic normocephalic, EOMI, PE R LA, sclera anicteric. Oropharynx; not examined today Neck; supple, no JVD Cardiovascular; regular rate and rhythm with S1-S2, no murmur rub or gallop Pulmonary; Clear bilaterally Abdomen; soft, nontender, nondistended, no palpable mass or hepatosplenomegaly, bowel sounds are active Rectal; not done today Skin; benign exam, no jaundice rash or appreciable lesions Extremities; no clubbing cyanosis or edema skin warm and  dry Neuro/Psych; alert and oriented x4, grossly nonfocal mood and affect appropriate        Assessment & Plan:   #5 46 year old white male with 2 recent episodes of mild pancreatitis, initial episode November 2022, and second episode mid January 2023. CT with initial episode did show mild peripancreatic inflammatory changes, no ductal dilation, no cholelithiasis, no evidence of chronic pancreatitis. Recent ultrasound, no gallstones or sludge, CBD 4 mm some increased hepatic echogenicity consistent with steatosis  IgG4 within normal limits so unlikely autoimmune pancreatitis No EtOH use  Etiology of the pancreatitis not entirely clear, suspect this may be medication induced and Lipitor which he had been on has just recently been discontinued.  Cannot rule out sludge/microlithiasis  #2 adult onset diabetes mellitus  Plan; we discussed low-fat diet Continue to avoid EtOH Patient will stay off Lipitor/discontinue Lipitor For now would continue to hold Glucophage as well  He will keep  his appointment with surgery to discuss possible laparoscopic cholecystectomy  He is advised to call should he have any recurrence of symptoms, would repeat urgent labs and consider repeat imaging at that time.  Will schedule for follow-up visit with Dr. Christella Hartigan in about 6 weeks, happy to see him in the interim, but hopefully will not have recurrent episodes off Lipitor.  Jessice Madill S Girard Koontz PA-C 07/30/2021   Cc: Plotnikov, Georgina Quint, MD

## 2021-07-30 NOTE — Patient Instructions (Addendum)
If you are age 47 or younger, your body mass index should be between 19-25. Your Body mass index is 31.17 kg/m. If this is out of the aformentioned range listed, please consider follow up with your Primary Care Provider.  ________________________________________________________  The [facility] providers would like to encourage you to use [facility] to communicate with providers for non-urgent requests or questions.  Due to long hold times on the telephone, sending your provider a message by [facility] may be a faster and more efficient way to get a response.  Please allow 48 business hours for a response.  Please remember that this is for non-urgent requests.  _______________________________________________________  Stay off Lipitor\Atorvastatin. Please remember to contact your Primary Care to discuss options.  Stay off Metformin.  Follow a low fat diet.  Call for reoccurrence of symptoms, ask to talk to Dr. Ardis Hughs' nurse.  You have been scheduled to follow up with Dr. Ardis Hughs on September 21, 2021 at 3:40 pm  Thank you for entrusting me with your care and choosing [facility].  Amy Esterwood, PA-C

## 2021-07-31 NOTE — Progress Notes (Signed)
I agree with the above note, plan 

## 2021-08-07 ENCOUNTER — Other Ambulatory Visit: Payer: Self-pay

## 2021-08-07 ENCOUNTER — Ambulatory Visit (INDEPENDENT_AMBULATORY_CARE_PROVIDER_SITE_OTHER): Payer: 59 | Admitting: Internal Medicine

## 2021-08-07 DIAGNOSIS — K8500 Idiopathic acute pancreatitis without necrosis or infection: Secondary | ICD-10-CM | POA: Diagnosis not present

## 2021-08-07 DIAGNOSIS — N5089 Other specified disorders of the male genital organs: Secondary | ICD-10-CM

## 2021-08-07 NOTE — Assessment & Plan Note (Addendum)
Post - vasectomy. Likely a spermatocele on the left Obtain testicular ultrasound

## 2021-08-07 NOTE — Progress Notes (Signed)
Subjective:  Patient ID: Dean Glass, male    DOB: 14-Jun-1976  Age: 46 y.o. MRN: [id number]  CC: Follow-up (3 month f/u, pt would like to discuss testicle discomfort.)   HPI Dean Glass presents for pancreatitis f/u  C/o a spot on the L testis x 3 wks  Outpatient Medications Prior to Visit  Medication Sig Dispense Refill   aspirin EC 81 MG tablet Take 81 mg by mouth daily. Swallow whole.     Blood Glucose Monitoring Suppl (ONETOUCH VERIO) w/Device KIT 1 Units by Does not apply route daily as needed. 1 kit 1   Cholecalciferol (VITAMIN D3) 50 MCG (2000 UT) capsule Take 1 capsule (2,000 Units total) by mouth daily. 100 capsule 3   glucose blood (ONETOUCH VERIO) test strip Use as instructed 50 each 5   HYDROcodone-acetaminophen (NORCO) 7.5-325 MG tablet Take 1-2 tablets by mouth every 6 (six) hours as needed for moderate pain. 50 tablet 0   Lancets (ONETOUCH ULTRASOFT) lancets Use as instructed 100 each 5   meloxicam (MOBIC) 15 MG tablet TAKE 1 TABLET (15 MG TOTAL) BY MOUTH DAILY. (Patient taking differently: Take 15 mg by mouth daily as needed for pain.) 30 tablet 2   metFORMIN (GLUCOPHAGE) 500 MG tablet Take 1 tablet (500 mg total) by mouth daily with breakfast. 90 tablet 3   ondansetron (ZOFRAN) 4 MG tablet Take 1 tablet (4 mg total) by mouth 2 (two) times daily as needed for nausea or vomiting. 50 tablet 1   zinc gluconate 50 MG tablet Take 50 mg by mouth daily.     No facility-administered medications prior to visit.    ROS: Review of Systems  Constitutional:  Negative for appetite change, fatigue and unexpected weight change.  HENT:  Negative for congestion, nosebleeds, sneezing, sore throat and trouble swallowing.   Eyes:  Negative for itching and visual disturbance.  Respiratory:  Negative for cough.   Cardiovascular:  Negative for chest pain, palpitations and leg swelling.  Gastrointestinal:  Positive for abdominal pain. Negative for abdominal distention, blood  in stool, diarrhea and nausea.  Genitourinary:  Negative for frequency and hematuria.  Musculoskeletal:  Negative for back pain, gait problem, joint swelling and neck pain.  Skin:  Negative for rash.  Neurological:  Negative for dizziness, tremors, speech difficulty and weakness.  Psychiatric/Behavioral:  Negative for agitation, dysphoric mood and sleep disturbance. The patient is not nervous/anxious.    Objective:  BP 122/68    Pulse 100    Temp 98 F (36.7 C) (Oral)    Ht 5' 7"  (1.702 m)    Wt 198 lb (89.8 kg)    SpO2 96%    BMI 31.01 kg/m   BP Readings from Last 3 Encounters:  08/07/21 122/68  07/30/21 110/70  07/06/21 110/66    Wt Readings from Last 3 Encounters:  08/07/21 198 lb (89.8 kg)  07/30/21 202 lb (91.6 kg)  07/06/21 202 lb (91.6 kg)    Physical Exam Constitutional:      General: He is not in acute distress.    Appearance: Normal appearance. He is well-developed.     Comments: NAD  Eyes:     Conjunctiva/sclera: Conjunctivae normal.     Pupils: Pupils are equal, round, and reactive to light.  Neck:     Thyroid: No thyromegaly.     Vascular: No JVD.  Cardiovascular:     Rate and Rhythm: Normal rate and regular rhythm.     Heart sounds: Normal heart sounds.  No murmur heard.   No friction rub. No gallop.  Pulmonary:     Effort: Pulmonary effort is normal. No respiratory distress.     Breath sounds: Normal breath sounds. No wheezing or rales.  Chest:     Chest wall: No tenderness.  Abdominal:     General: Bowel sounds are normal. There is no distension.     Palpations: Abdomen is soft. There is no mass.     Tenderness: There is no abdominal tenderness. There is no guarding or rebound.  Musculoskeletal:        General: No tenderness. Normal range of motion.     Cervical back: Normal range of motion.  Lymphadenopathy:     Cervical: No cervical adenopathy.  Skin:    General: Skin is warm and dry.     Findings: No rash.  Neurological:     Mental Status: He  is alert and oriented to person, place, and time.     Cranial Nerves: No cranial nerve deficit.     Motor: No abnormal muscle tone.     Coordination: Coordination normal.     Gait: Gait normal.     Deep Tendon Reflexes: Reflexes are normal and symmetric.  Psychiatric:        Behavior: Behavior normal.        Thought Content: Thought content normal.        Judgment: Judgment normal.   No scrotal masses Cystic epididymis, nontender Lab Results  Component Value Date   WBC 12.4 (H) 07/15/2021   HGB 14.4 07/15/2021   HCT 43.0 07/15/2021   PLT 284.0 07/15/2021   GLUCOSE 114 (H) 07/15/2021   CHOL 136 03/31/2021   TRIG 120.0 03/31/2021   HDL 37.80 (L) 03/31/2021   LDLCALC 74 03/31/2021   ALT 20 07/15/2021   AST 13 07/15/2021   NA 137 07/15/2021   K 3.6 07/15/2021   CL 103 07/15/2021   CREATININE 0.85 07/15/2021   BUN 10 07/15/2021   CO2 25 07/15/2021   TSH 2.27 03/31/2021   PSA 1.46 09/04/2019   HGBA1C 7.0 (H) 03/31/2021    US Abdomen Limited RUQ (LIVER/GB)  Result Date: 07/10/2021 CLINICAL DATA:  Acute gallstone pancreatitis EXAM: ULTRASOUND ABDOMEN LIMITED RIGHT UPPER QUADRANT COMPARISON:  CT abdomen pelvis 05/07/2021 FINDINGS: Gallbladder: No gallstones or wall thickening visualized. No sonographic Murphy sign noted by sonographer. Common bile duct: Diameter: 4 mm Liver: No focal lesion. Diffusely increased parenchymal echogenicity. Portal vein is patent on color Doppler imaging with normal direction of blood flow towards the liver. Other: None. IMPRESSION: Diffuse increased echogenicity of the hepatic parenchyma is a nonspecific indicator of hepatocellular dysfunction, most commonly steatosis. Electronically Signed   By: Miachel Roux M.D.   On: 07/10/2021 13:26    Assessment & Plan:   Problem List Items Addressed This Visit     Acute pancreatitis    Recurrent.  Prescription meds Lipitor and metformin are on hold      Testicular nodule    Post - vasectomy. Likely a  spermatocele on the left Obtain testicular ultrasound       Relevant Orders   US SCROTUM W/DOPPLER      No orders of the defined types were placed in this encounter.     Follow-up: No follow-ups on file.  Walker Kehr, MD

## 2021-08-11 ENCOUNTER — Ambulatory Visit: Payer: 59 | Admitting: Internal Medicine

## 2021-08-20 ENCOUNTER — Encounter: Payer: Self-pay | Admitting: Internal Medicine

## 2021-08-20 ENCOUNTER — Telehealth: Payer: Self-pay | Admitting: Internal Medicine

## 2021-08-20 NOTE — Assessment & Plan Note (Signed)
Recurrent.  Prescription meds Lipitor and metformin are on hold

## 2021-08-20 NOTE — Telephone Encounter (Signed)
W/ doppler

## 2021-08-20 NOTE — Telephone Encounter (Signed)
Spoke Dean Glass and was able to inform of the update and she has added it to the pts orders.

## 2021-08-20 NOTE — Telephone Encounter (Signed)
States he is scheduled for Monday with ultrasound of scrotum. Does it need doppler, or without doppler?

## 2021-08-24 ENCOUNTER — Ambulatory Visit
Admission: RE | Admit: 2021-08-24 | Discharge: 2021-08-24 | Disposition: A | Discharge status: Home / Self Care | Payer: 59 | Source: Ambulatory Visit | Attending: Internal Medicine | Admitting: Internal Medicine

## 2021-08-24 DIAGNOSIS — N5089 Other specified disorders of the male genital organs: Secondary | ICD-10-CM

## 2021-08-27 ENCOUNTER — Other Ambulatory Visit: Payer: Self-pay | Admitting: General Surgery

## 2021-08-27 DIAGNOSIS — K859 Acute pancreatitis without necrosis or infection, unspecified: Secondary | ICD-10-CM

## 2021-09-12 ENCOUNTER — Other Ambulatory Visit: Payer: Self-pay

## 2021-09-12 ENCOUNTER — Ambulatory Visit
Admission: RE | Admit: 2021-09-12 | Discharge: 2021-09-12 | Disposition: A | Discharge status: Home / Self Care | Payer: 59 | Source: Ambulatory Visit | Attending: General Surgery | Admitting: General Surgery

## 2021-09-12 DIAGNOSIS — K859 Acute pancreatitis without necrosis or infection, unspecified: Secondary | ICD-10-CM

## 2021-09-12 MED ORDER — GADOBENATE DIMEGLUMINE 529 MG/ML IV SOLN
18.0000 mL | Freq: Once | INTRAVENOUS | Status: AC | PRN
  Administered 2021-09-12: 18 mL via INTRAVENOUS

## 2021-09-21 ENCOUNTER — Encounter: Payer: Self-pay | Admitting: Gastroenterology

## 2021-09-21 ENCOUNTER — Ambulatory Visit (INDEPENDENT_AMBULATORY_CARE_PROVIDER_SITE_OTHER): Payer: 59 | Admitting: Gastroenterology

## 2021-09-21 DIAGNOSIS — Z1211 Encounter for screening for malignant neoplasm of colon: Secondary | ICD-10-CM

## 2021-09-21 DIAGNOSIS — K859 Acute pancreatitis without necrosis or infection, unspecified: Secondary | ICD-10-CM

## 2021-09-21 NOTE — Progress Notes (Signed)
Review of pertinent gastrointestinal problems: ?1.  Recurrent acute pancreatitis, unclear etiology.  November 2022, January 2023.  CT scan November 2022 mild peripancreatic inflammatory changes around the body and tail.  Ultrasound 06/2021 no gallstones and gallbladder.  Common bile duct normal.  MRI with MRCP 08/2021 normal liver, normal gallbladder, somewhat atrophic pancreas with a few small cystic foci (surveillance MRI in 1 year ) throughout the pancreas measuring up to 7 mm.  Normal main pancreatic duct. ?No stones in gallbladder on ultrasound or MR ?Does not drink nearly enough alcohol to really cause this ?Normal triglycerides 03/2021 ?IgG4 normal ?Perhaps Lipitor (has been associated with pancreatitis) ? ? ?HPI: ?This is a very pleasant 46 year old man whom I last saw about 2 months ago. ? ?His weight is up 5 pounds since his last office visit here about 2 months ago ? ?He has really had no abdominal pains for quite some time. ? ?He went to a concert over the weekend drink 2 or 3 drinks and had no troubles after that.  Other than that he rarely drinks. ? ?We had a nice discussion about his pancreas, family history of pancreas issues.  His brother died at age 38.  He was quite sickly and was on hemodialysis but patient tells me his eventual cause of death was a "ruptured cyst in his pancreas" ? ?His grandmother died of pancreatic cancer, she was a significant alcoholic ? ?One of his uncles had colon cancer ? ?He does not have any issues with his GI tract, no specific abdominal pains or diarrhea constipation or bleeding.  He has never had colon cancer screening ? ? ?ROS: complete GI ROS as described in HPI, all other review negative. ? ?Constitutional:  No unintentional weight loss ? ? ?Past Medical History:  ?Diagnosis Date  ? GERD (gastroesophageal reflux disease)   ? Seasonal allergies   ? ? ?Past Surgical History:  ?Procedure Laterality Date  ? VASECTOMY    ? ? ?Current Outpatient Medications  ?Medication  Instructions  ? aspirin EC 81 mg, Daily  ? glucose blood (ONETOUCH VERIO) test strip Use as instructed  ? HYDROcodone-acetaminophen (NORCO) 7.5-325 MG tablet 1-2 tablets, Oral, Every 6 hours PRN  ? Lancets (ONETOUCH ULTRASOFT) lancets Use as instructed  ? metFORMIN (GLUCOPHAGE) 500 mg, Oral, Daily with breakfast  ? ondansetron (ZOFRAN) 4 mg, Oral, 2 times daily PRN  ? OneTouch Verio 1 Units, Does not apply, Daily PRN  ? Vitamin D3 2,000 Units, Oral, Daily  ? zinc gluconate 50 mg, Daily  ? ? ?Allergies as of 09/21/2021 - Review Complete 09/21/2021  ?Allergen Reaction Noted  ? Atorvastatin  07/30/2021  ? ? ?Family History  ?Problem Relation Age of Onset  ? Diabetes Mother   ? Coronary artery disease Father   ? Heart disease Father   ?     cad  ? Early death Brother 54  ?     cystic kidney  ? ? ?Social History  ? ?Socioeconomic History  ? Marital status: Married  ?  Spouse name: Not on file  ? Number of children: Not on file  ? Years of education: Not on file  ? Highest education level: Not on file  ?Occupational History  ? Not on file  ?Tobacco Use  ? Smoking status: Former  ?  Packs/day: 0.50  ?  Types: Cigarettes  ?  Quit date: 03/13/2015  ?  Years since quitting: 6.5  ? Smokeless tobacco: Former  ?Vaping Use  ? Vaping Use: Never  used  ?Substance and Sexual Activity  ? Alcohol use: Yes  ?  Alcohol/week: 0.0 standard drinks  ?  Comment: Social  ? Drug use: No  ? Sexual activity: Yes  ?Other Topics Concern  ? Not on file  ?Social History Narrative  ? Not on file  ? ?Social Determinants of Health  ? ?Financial Resource Strain: Not on file  ?Food Insecurity: Not on file  ?Transportation Needs: Not on file  ?Physical Activity: Not on file  ?Stress: Not on file  ?Social Connections: Not on file  ?Intimate Partner Violence: Not on file  ? ? ? ?Physical Exam: ?BP 112/66   Pulse (!) 106   Ht '5\' 7"'$  (1.702 m)   Wt 207 lb 4 oz (94 kg)   BMI 32.46 kg/m?  ?Constitutional: generally well-appearing ?Psychiatric: alert and  oriented x3 ?Abdomen: soft, nontender, nondistended, no obvious ascites, no peritoneal signs, normal bowel sounds ?No peripheral edema noted in lower extremities ? ?Assessment and plan: ?46 y.o. male with 2 episodes of acute pancreatitis, unclear etiology, routine risk for colon cancer ? ?First Lipitor has been reported to have caused acute pancreatitis and so I recommended that he never resume Lipitor.  He is not taking it at least 2 or 3 months now.  Metformin has never been reported to have caused pancreatitis that I can see and so I think it is safe that he resume his metformin for his early diabetes.  He knows to call if he has recurrent severe abdominal pains. ? ?Second his pancreas did appear a bit atrophic and there were some cysts and it which might be the result of his pancreatitis rather than any cause.  He does have an unusual pancreatic family history, see above.  I am going to order repeat MRI with MRCP of his pancreas in 1 year as surveillance. ? ?Third he is at routine risk for colon cancer and I recommended screening colonoscopy for him.  See no reason for blood tests or imaging studies prior to that. ? ? ?Please see the "Patient Instructions" section for addition details about the plan. ? ?Owens Loffler, MD ?[facility] ?09/21/2021, 3:48 PM ? ? ?Total time on date of encounter was 30 minutes (this included time spent preparing to see the patient reviewing records; obtaining and/or reviewing separately obtained history; performing a medically appropriate exam and/or evaluation; counseling and educating the patient and family if present; ordering medications, tests or procedures if applicable; and documenting clinical information in the health record). ? ?

## 2021-09-21 NOTE — Patient Instructions (Addendum)
If you are age 47 or younger, your body mass index should be between 19-25. Your Body mass index is 32.46 kg/m?Marland Kitchen If this is out of the aformentioned range listed, please consider follow up with your Primary Care Provider.  ?________________________________________________________ ? ?The [facility] providers would like to encourage you to use [facility] to communicate with providers for non-urgent requests or questions.  Due to long hold times on the telephone, sending your provider a message by [facility] may be a faster and more efficient way to get a response.  Please allow 48 business hours for a response.  Please remember that this is for non-urgent requests.  ?_______________________________________________________ ? ?RESTART: Metformin '500mg'$  one tablet daily ? ?You have been scheduled for a colonoscopy. Please follow written instructions given to you at your visit today.  ?Please pick up your prep supplies at the pharmacy within the next 1-3 days. ?If you use inhalers (even only as needed), please bring them with you on the day of your procedure. ? ?You will need a MRI/MRCP in 1 year (March 2024).  We will contact you to schedule this appointment. ? ?Due to recent changes in healthcare laws, you may see the results of your imaging and laboratory studies on MyChart before your provider has had a chance to review them.  We understand that in some cases there may be results that are confusing or concerning to you. Not all laboratory results come back in the same time frame and the provider may be waiting for multiple results in order to interpret others.  Please give [place] 48 hours in order for your provider to thoroughly review all the results before contacting the office for clarification of your results.  ? ?Thank you for entrusting me with your care and choosing [facility]. ? ?Dr Ardis Hughs ? ? ?

## 2021-09-30 ENCOUNTER — Ambulatory Visit: Payer: 59 | Admitting: Internal Medicine

## 2021-10-19 ENCOUNTER — Ambulatory Visit (INDEPENDENT_AMBULATORY_CARE_PROVIDER_SITE_OTHER): Payer: 59 | Admitting: Internal Medicine

## 2021-10-19 ENCOUNTER — Encounter: Payer: Self-pay | Admitting: Internal Medicine

## 2021-10-19 VITALS — BP 100/80 | HR 95 | Temp 97.9°F | Ht 67.0 in | Wt 207.0 lb

## 2021-10-19 DIAGNOSIS — Z125 Encounter for screening for malignant neoplasm of prostate: Secondary | ICD-10-CM | POA: Diagnosis not present

## 2021-10-19 DIAGNOSIS — Z0000 Encounter for general adult medical examination without abnormal findings: Secondary | ICD-10-CM

## 2021-10-19 DIAGNOSIS — K8500 Idiopathic acute pancreatitis without necrosis or infection: Secondary | ICD-10-CM | POA: Diagnosis not present

## 2021-10-19 DIAGNOSIS — E1169 Type 2 diabetes mellitus with other specified complication: Secondary | ICD-10-CM | POA: Diagnosis not present

## 2021-10-19 LAB — CBC WITH DIFFERENTIAL/PLATELET
Basophils Absolute: 0 10*3/uL (ref 0.0–0.1)
Basophils Relative: 0.4 % (ref 0.0–3.0)
Eosinophils Absolute: 0.1 10*3/uL (ref 0.0–0.7)
Eosinophils Relative: 1.5 % (ref 0.0–5.0)
HCT: 42.8 % (ref 39.0–52.0)
Hemoglobin: 14.6 g/dL (ref 13.0–17.0)
Lymphocytes Relative: 46.7 % — ABNORMAL HIGH (ref 12.0–46.0)
Lymphs Abs: 3.1 10*3/uL (ref 0.7–4.0)
MCHC: 34 g/dL (ref 30.0–36.0)
MCV: 89.8 fl (ref 78.0–100.0)
Monocytes Absolute: 0.5 10*3/uL (ref 0.1–1.0)
Monocytes Relative: 7.5 % (ref 3.0–12.0)
Neutro Abs: 2.9 10*3/uL (ref 1.4–7.7)
Neutrophils Relative %: 43.9 % (ref 43.0–77.0)
Platelets: 250 10*3/uL (ref 150.0–400.0)
RBC: 4.76 Mil/uL (ref 4.22–5.81)
RDW: 13.3 % (ref 11.5–15.5)
WBC: 6.7 10*3/uL (ref 4.0–10.5)

## 2021-10-19 LAB — COMPREHENSIVE METABOLIC PANEL
ALT: 23 U/L (ref 0–53)
AST: 17 U/L (ref 0–37)
Albumin: 4.3 g/dL (ref 3.5–5.2)
Alkaline Phosphatase: 56 U/L (ref 39–117)
BUN: 12 mg/dL (ref 6–23)
CO2: 27 mEq/L (ref 19–32)
Calcium: 9.4 mg/dL (ref 8.4–10.5)
Chloride: 102 mEq/L (ref 96–112)
Creatinine, Ser: 0.93 mg/dL (ref 0.40–1.50)
GFR: 98.58 mL/min (ref >60.00)
Glucose, Bld: 140 mg/dL — ABNORMAL HIGH (ref 70–99)
Potassium: 4.2 mEq/L (ref 3.5–5.1)
Sodium: 138 mEq/L (ref 135–145)
Total Bilirubin: 0.7 mg/dL (ref 0.2–1.2)
Total Protein: 6.9 g/dL (ref 6.0–8.3)

## 2021-10-19 LAB — URINALYSIS
Bilirubin Urine: NEGATIVE
Hgb urine dipstick: NEGATIVE
Ketones, ur: NEGATIVE
Leukocytes,Ua: NEGATIVE
Nitrite: NEGATIVE
Specific Gravity, Urine: >=1.03 — AB (ref 1.000–1.030)
Total Protein, Urine: NEGATIVE
Urine Glucose: NEGATIVE
Urobilinogen, UA: 0.2 (ref 0.0–1.0)
pH: 5.5 (ref 5.0–8.0)

## 2021-10-19 LAB — TSH: TSH: 1.91 u[IU]/mL (ref 0.35–5.50)

## 2021-10-19 LAB — LIPID PANEL
Cholesterol: 201 mg/dL — ABNORMAL HIGH (ref 0–200)
HDL: 39.7 mg/dL (ref >39.00)
NonHDL: 161.72
Total CHOL/HDL Ratio: 5
Triglycerides: 253 mg/dL — ABNORMAL HIGH (ref 0.0–149.0)
VLDL: 50.6 mg/dL — ABNORMAL HIGH (ref 0.0–40.0)

## 2021-10-19 LAB — HEMOGLOBIN A1C: Hgb A1c MFr Bld: 6.8 % — ABNORMAL HIGH (ref 4.6–6.5)

## 2021-10-19 LAB — PSA: PSA: 1.11 ng/mL (ref 0.10–4.00)

## 2021-10-19 LAB — LDL CHOLESTEROL, DIRECT: Direct LDL: 124 mg/dL

## 2021-10-19 MED ORDER — METFORMIN HCL ER 500 MG PO TB24: 500.0000 mg | ORAL_TABLET | Freq: Every day | ORAL | 3 refills | Status: DC

## 2021-10-19 NOTE — Assessment & Plan Note (Signed)
Per Dr Ardis Hughs - it was due to Lipitor - d/c'd ?

## 2021-10-19 NOTE — Assessment & Plan Note (Signed)
Change to Metformin XR due to loose stools ?

## 2021-10-19 NOTE — Assessment & Plan Note (Addendum)
We discussed age appropriate health related issues, including available/recomended screening tests and vaccinations. Labs were ordered to be later reviewed . All questions were answered. We discussed one or more of the following - seat belt use, use of sunscreen/sun exposure exercise, fall risk reduction, second hand smoke exposure, firearm use and storage, seat belt use, a need for adhering to healthy diet and exercise. ?Labs were ordered.  All questions were answered. ?Colon pending 2023 ?Prevnar - advised ?tDAP due in 2024 ?2022 Coronary calcium CT score of 2. ?

## 2021-10-19 NOTE — Progress Notes (Signed)
? ?Subjective:  ?Patient ID: Dean Glass, male    DOB: 1976/03/09  Age: 46 y.o. MRN: [id number] ? ?CC: No chief complaint on file. ? ? ?HPI ?Dean Glass presents for a well exam ? ?Outpatient Medications Prior to Visit  ?Medication Sig Dispense Refill  ? aspirin EC 81 MG tablet Take 81 mg by mouth daily. Swallow whole.    ? Blood Glucose Monitoring Suppl (ONETOUCH VERIO) w/Device KIT 1 Units by Does not apply route daily as needed. 1 kit 1  ? glucose blood (ONETOUCH VERIO) test strip Use as instructed 50 each 5  ? Lancets (ONETOUCH ULTRASOFT) lancets Use as instructed 100 each 5  ? Cholecalciferol (VITAMIN D3) 50 MCG (2000 UT) capsule Take 1 capsule (2,000 Units total) by mouth daily. (Patient not taking: Reported on 09/21/2021) 100 capsule 3  ? zinc gluconate 50 MG tablet Take 50 mg by mouth daily. (Patient not taking: Reported on 09/21/2021)    ? HYDROcodone-acetaminophen (NORCO) 7.5-325 MG tablet Take 1-2 tablets by mouth every 6 (six) hours as needed for moderate pain. (Patient not taking: Reported on 09/21/2021) 50 tablet 0  ? metFORMIN (GLUCOPHAGE) 500 MG tablet Take 1 tablet (500 mg total) by mouth daily with breakfast. (Patient not taking: Reported on 09/21/2021) 90 tablet 3  ? ondansetron (ZOFRAN) 4 MG tablet Take 1 tablet (4 mg total) by mouth 2 (two) times daily as needed for nausea or vomiting. (Patient not taking: Reported on 09/21/2021) 50 tablet 1  ? ?No facility-administered medications prior to visit.  ? ? ?ROS: ?Review of Systems  ?Constitutional:  Negative for appetite change, fatigue and unexpected weight change.  ?HENT:  Negative for congestion, nosebleeds, sneezing, sore throat and trouble swallowing.   ?Eyes:  Negative for itching and visual disturbance.  ?Respiratory:  Negative for cough.   ?Cardiovascular:  Negative for chest pain, palpitations and leg swelling.  ?Gastrointestinal:  Positive for diarrhea. Negative for abdominal distention, abdominal pain, blood in stool, nausea and  vomiting.  ?Genitourinary:  Negative for frequency and hematuria.  ?Musculoskeletal:  Negative for back pain, gait problem, joint swelling and neck pain.  ?Skin:  Negative for rash.  ?Neurological:  Negative for dizziness, tremors, speech difficulty and weakness.  ?Psychiatric/Behavioral:  Negative for agitation, dysphoric mood and sleep disturbance. The patient is not nervous/anxious.   ? ?Objective:  ?BP 100/80 (BP Location: Left Arm, Patient Position: Sitting, Cuff Size: Large)   Pulse 95   Temp 97.9 ?F (36.6 ?C) (Oral)   Ht 5' 7"  (1.702 m)   Wt 207 lb (93.9 kg)   SpO2 96%   BMI 32.42 kg/m?  ? ?BP Readings from Last 3 Encounters:  ?10/19/21 100/80  ?09/21/21 112/66  ?08/07/21 122/68  ? ? ?Wt Readings from Last 3 Encounters:  ?10/19/21 207 lb (93.9 kg)  ?09/21/21 207 lb 4 oz (94 kg)  ?08/07/21 198 lb (89.8 kg)  ? ? ?Physical Exam ?Constitutional:   ?   General: He is not in acute distress. ?   Appearance: He is well-developed.  ?   Comments: NAD  ?Eyes:  ?   Conjunctiva/sclera: Conjunctivae normal.  ?   Pupils: Pupils are equal, round, and reactive to light.  ?Neck:  ?   Thyroid: No thyromegaly.  ?   Vascular: No JVD.  ?Cardiovascular:  ?   Rate and Rhythm: Normal rate and regular rhythm.  ?   Heart sounds: Normal heart sounds. No murmur heard. ?  No friction rub. No gallop.  ?Pulmonary:  ?  Effort: Pulmonary effort is normal. No respiratory distress.  ?   Breath sounds: Normal breath sounds. No wheezing or rales.  ?Chest:  ?   Chest wall: No tenderness.  ?Abdominal:  ?   General: Bowel sounds are normal. There is no distension.  ?   Palpations: Abdomen is soft. There is no mass.  ?   Tenderness: There is no abdominal tenderness. There is no guarding or rebound.  ?Musculoskeletal:     ?   General: No tenderness. Normal range of motion.  ?   Cervical back: Normal range of motion.  ?Lymphadenopathy:  ?   Cervical: No cervical adenopathy.  ?Skin: ?   General: Skin is warm and dry.  ?   Findings: No rash.   ?Neurological:  ?   Mental Status: He is alert and oriented to person, place, and time.  ?   Cranial Nerves: No cranial nerve deficit.  ?   Motor: No abnormal muscle tone.  ?   Coordination: Coordination normal.  ?   Gait: Gait normal.  ?   Deep Tendon Reflexes: Reflexes are normal and symmetric.  ?Psychiatric:     ?   Behavior: Behavior normal.     ?   Thought Content: Thought content normal.     ?   Judgment: Judgment normal.  ?Rectal - per GI pending ? ?Lab Results  ?Component Value Date  ? WBC 12.4 (H) 07/15/2021  ? HGB 14.4 07/15/2021  ? HCT 43.0 07/15/2021  ? PLT 284.0 07/15/2021  ? GLUCOSE 114 (H) 07/15/2021  ? CHOL 136 03/31/2021  ? TRIG 120.0 03/31/2021  ? HDL 37.80 (L) 03/31/2021  ? [place] 74 03/31/2021  ? ALT 20 07/15/2021  ? AST 13 07/15/2021  ? NA 137 07/15/2021  ? K 3.6 07/15/2021  ? CL 103 07/15/2021  ? CREATININE 0.85 07/15/2021  ? BUN 10 07/15/2021  ? CO2 25 07/15/2021  ? TSH 2.27 03/31/2021  ? PSA 1.46 09/04/2019  ? HGBA1C 7.0 (H) 03/31/2021  ? ? ?MR ABDOMEN MRCP W WO CONTAST ? ?Result Date: 09/14/2021 ?CLINICAL DATA:  Acute pancreatitis EXAM: MRI ABDOMEN WITHOUT AND WITH CONTRAST (INCLUDING MRCP) TECHNIQUE: Multiplanar multisequence MR imaging of the abdomen was performed both before and after the administration of intravenous contrast. Heavily T2-weighted images of the biliary and pancreatic ducts were obtained, and three-dimensional MRCP images were rendered by post processing. CONTRAST:  17m MULTIHANCE GADOBENATE DIMEGLUMINE 529 MG/ML IV SOLN COMPARISON:  CT abdomen and pelvis 05/07/2021 FINDINGS: Lower chest: No acute findings. Hepatobiliary: Liver is normal in size and contour with no suspicious mass identified. Diffuse hepatic steatosis. Gallbladder appears normal. No biliary ductal dilatation identified. Pancreas: Limited evaluation due to motion. No acute inflammatory changes identified. Pancreas is atrophic and there are a few small cystic foci scattered throughout the pancreas  measuring up to 7 mm in the tail anteriorly. Main pancreatic duct is normal caliber. No definite solid mass identified in the pancreas. Spleen:  Within normal limits in size and appearance. Adrenals/Urinary Tract: Adrenal glands appear normal. No suspicious renal mass or hydronephrosis identified bilaterally. Stomach/Bowel: No evidence of bowel obstruction. Colonic diverticulosis. Vascular/Lymphatic: No pathologically enlarged lymph nodes identified. No abdominal aortic aneurysm demonstrated. Other:  Ascites. Musculoskeletal: No suspicious bony lesions identified. IMPRESSION: 1. Atrophic pancreas. A few small cystic foci scattered throughout the pancreas measuring up to 7 mm, may represent prominent side branches, pancreatic cysts/pseudocysts, or side branch IPMN. Main pancreatic duct is normal caliber. Recommend follow up pre and post contrast  MRI/MRCP or pancreatic protocol CT in 1 year. This recommendation follows ACR consensus guidelines: Management of Incidental Pancreatic Cysts: A White Paper of the ACR Incidental Findings Committee. [place] [id number];[id number]. 2. Hepatic steatosis. 3. Colonic diverticulosis. Electronically Signed   By: Ofilia Neas M.D.   On: 09/14/2021 13:13  ? ? ?Assessment & Plan:  ? ?Problem List Items Addressed This Visit   ? ? Well adult exam  ?  We discussed age appropriate health related issues, including available/recomended screening tests and vaccinations. Labs were ordered to be later reviewed . All questions were answered. We discussed one or more of the following - seat belt use, use of sunscreen/sun exposure exercise, fall risk reduction, second hand smoke exposure, firearm use and storage, seat belt use, a need for adhering to healthy diet and exercise. ?Labs were ordered.  All questions were answered. ?Colon pending 2023 ?  ?  ? Acute pancreatitis  ?  Per Dr Ardis Hughs - it was due to Lipitor - d/c'd ?  ?  ? Diabetes ([place])  ?  Change to Metformin XR due to loose  stools ? ?  ?  ? Relevant Medications  ? metFORMIN (GLUCOPHAGE-XR) 500 MG 24 hr tablet  ?  ? ? ?Meds ordered this encounter  ?Medications  ? metFORMIN (GLUCOPHAGE-XR) 500 MG 24 hr tablet  ?  Sig: Take 1 tablet (500 mg total) by

## 2021-11-11 ENCOUNTER — Encounter: Payer: 59 | Admitting: Gastroenterology

## 2021-12-02 ENCOUNTER — Other Ambulatory Visit: Payer: Self-pay | Admitting: Internal Medicine

## 2022-01-23 ENCOUNTER — Encounter: Payer: Self-pay | Admitting: Gastroenterology

## 2022-01-24 ENCOUNTER — Encounter (HOSPITAL_COMMUNITY): Payer: Self-pay

## 2022-01-24 ENCOUNTER — Other Ambulatory Visit: Payer: Self-pay

## 2022-01-24 ENCOUNTER — Emergency Department (HOSPITAL_COMMUNITY): Payer: 59

## 2022-01-24 ENCOUNTER — Telehealth: Payer: Self-pay | Admitting: Gastroenterology

## 2022-01-24 ENCOUNTER — Inpatient Hospital Stay (HOSPITAL_COMMUNITY)
Admission: EM | Admit: 2022-01-24 | Discharge: 2022-01-28 | DRG: 439 | Disposition: A | Discharge status: Home / Self Care | Payer: 59 | Attending: Internal Medicine | Admitting: Internal Medicine

## 2022-01-24 ENCOUNTER — Encounter (HOSPITAL_BASED_OUTPATIENT_CLINIC_OR_DEPARTMENT_OTHER): Payer: Self-pay | Admitting: Emergency Medicine

## 2022-01-24 ENCOUNTER — Emergency Department (HOSPITAL_BASED_OUTPATIENT_CLINIC_OR_DEPARTMENT_OTHER)
Admission: EM | Admit: 2022-01-24 | Discharge: 2022-01-24 | Disposition: A | Discharge status: Home / Self Care | Payer: 59 | Source: Home / Self Care | Attending: Emergency Medicine | Admitting: Emergency Medicine

## 2022-01-24 DIAGNOSIS — Z7984 Long term (current) use of oral hypoglycemic drugs: Secondary | ICD-10-CM | POA: Insufficient documentation

## 2022-01-24 DIAGNOSIS — E876 Hypokalemia: Secondary | ICD-10-CM | POA: Diagnosis not present

## 2022-01-24 DIAGNOSIS — Z7982 Long term (current) use of aspirin: Secondary | ICD-10-CM | POA: Insufficient documentation

## 2022-01-24 DIAGNOSIS — E1169 Type 2 diabetes mellitus with other specified complication: Secondary | ICD-10-CM | POA: Diagnosis not present

## 2022-01-24 DIAGNOSIS — D72829 Elevated white blood cell count, unspecified: Secondary | ICD-10-CM

## 2022-01-24 DIAGNOSIS — E871 Hypo-osmolality and hyponatremia: Secondary | ICD-10-CM | POA: Diagnosis present

## 2022-01-24 DIAGNOSIS — K920 Hematemesis: Secondary | ICD-10-CM | POA: Diagnosis present

## 2022-01-24 DIAGNOSIS — E669 Obesity, unspecified: Secondary | ICD-10-CM | POA: Diagnosis present

## 2022-01-24 DIAGNOSIS — K8500 Idiopathic acute pancreatitis without necrosis or infection: Secondary | ICD-10-CM | POA: Insufficient documentation

## 2022-01-24 DIAGNOSIS — K5900 Constipation, unspecified: Secondary | ICD-10-CM | POA: Diagnosis present

## 2022-01-24 DIAGNOSIS — Z8249 Family history of ischemic heart disease and other diseases of the circulatory system: Secondary | ICD-10-CM | POA: Diagnosis not present

## 2022-01-24 DIAGNOSIS — E119 Type 2 diabetes mellitus without complications: Secondary | ICD-10-CM | POA: Insufficient documentation

## 2022-01-24 DIAGNOSIS — K861 Other chronic pancreatitis: Secondary | ICD-10-CM | POA: Diagnosis present

## 2022-01-24 DIAGNOSIS — Z833 Family history of diabetes mellitus: Secondary | ICD-10-CM

## 2022-01-24 DIAGNOSIS — K859 Acute pancreatitis without necrosis or infection, unspecified: Principal | ICD-10-CM | POA: Diagnosis present

## 2022-01-24 DIAGNOSIS — K219 Gastro-esophageal reflux disease without esophagitis: Secondary | ICD-10-CM | POA: Diagnosis present

## 2022-01-24 DIAGNOSIS — E1159 Type 2 diabetes mellitus with other circulatory complications: Secondary | ICD-10-CM

## 2022-01-24 DIAGNOSIS — Z87891 Personal history of nicotine dependence: Secondary | ICD-10-CM

## 2022-01-24 DIAGNOSIS — Z6831 Body mass index (BMI) 31.0-31.9, adult: Secondary | ICD-10-CM

## 2022-01-24 DIAGNOSIS — Z79899 Other long term (current) drug therapy: Secondary | ICD-10-CM

## 2022-01-24 LAB — CBC
HCT: 43.5 % (ref 39.0–52.0)
HCT: 42.6 % (ref 39.0–52.0)
Hemoglobin: 15.2 g/dL (ref 13.0–17.0)
Hemoglobin: 14.9 g/dL (ref 13.0–17.0)
MCH: 30.7 pg (ref 26.0–34.0)
MCH: 31.1 pg (ref 26.0–34.0)
MCHC: 34.9 g/dL (ref 30.0–36.0)
MCHC: 35 g/dL (ref 30.0–36.0)
MCV: 87.9 fL (ref 80.0–100.0)
MCV: 88.9 fL (ref 80.0–100.0)
Platelets: 306 10*3/uL (ref 150–400)
Platelets: 277 10*3/uL (ref 150–400)
RBC: 4.95 MIL/uL (ref 4.22–5.81)
RBC: 4.79 MIL/uL (ref 4.22–5.81)
RDW: 12.3 % (ref 11.5–15.5)
RDW: 12.1 % (ref 11.5–15.5)
WBC: 14.8 10*3/uL — ABNORMAL HIGH (ref 4.0–10.5)
WBC: 15.7 10*3/uL — ABNORMAL HIGH (ref 4.0–10.5)
nRBC: 0 % (ref 0.0–0.2)
nRBC: 0 % (ref 0.0–0.2)

## 2022-01-24 LAB — URINALYSIS, MICROSCOPIC (REFLEX)

## 2022-01-24 LAB — URINALYSIS, ROUTINE W REFLEX MICROSCOPIC
Glucose, UA: NEGATIVE mg/dL
Ketones, ur: 80 mg/dL — AB
Leukocytes,Ua: NEGATIVE
Nitrite: NEGATIVE
Protein, ur: NEGATIVE mg/dL
Specific Gravity, Urine: >=1.03 (ref 1.005–1.030)
pH: 5.5 (ref 5.0–8.0)

## 2022-01-24 LAB — COMPREHENSIVE METABOLIC PANEL
ALT: 25 U/L (ref 0–44)
ALT: 24 U/L (ref 0–44)
AST: 18 U/L (ref 15–41)
AST: 15 U/L (ref 15–41)
Albumin: 4.5 g/dL (ref 3.5–5.0)
Albumin: 4.4 g/dL (ref 3.5–5.0)
Alkaline Phosphatase: 61 U/L (ref 38–126)
Alkaline Phosphatase: 54 U/L (ref 38–126)
Anion gap: 9 (ref 5–15)
Anion gap: 11 (ref 5–15)
BUN: 11 mg/dL (ref 6–20)
BUN: 10 mg/dL (ref 6–20)
CO2: 23 mmol/L (ref 22–32)
CO2: 24 mmol/L (ref 22–32)
Calcium: 9.6 mg/dL (ref 8.9–10.3)
Calcium: 9.4 mg/dL (ref 8.9–10.3)
Chloride: 102 mmol/L (ref 98–111)
Chloride: 100 mmol/L (ref 98–111)
Creatinine, Ser: 0.89 mg/dL (ref 0.61–1.24)
Creatinine, Ser: 0.83 mg/dL (ref 0.61–1.24)
GFR, Estimated: >60 mL/min (ref >60)
GFR, Estimated: >60 mL/min (ref >60)
Glucose, Bld: 166 mg/dL — ABNORMAL HIGH (ref 70–99)
Glucose, Bld: 151 mg/dL — ABNORMAL HIGH (ref 70–99)
Potassium: 3.8 mmol/L (ref 3.5–5.1)
Potassium: 4 mmol/L (ref 3.5–5.1)
Sodium: 134 mmol/L — ABNORMAL LOW (ref 135–145)
Sodium: 135 mmol/L (ref 135–145)
Total Bilirubin: 1.4 mg/dL — ABNORMAL HIGH (ref 0.3–1.2)
Total Bilirubin: 1.6 mg/dL — ABNORMAL HIGH (ref 0.3–1.2)
Total Protein: 8 g/dL (ref 6.5–8.1)
Total Protein: 8 g/dL (ref 6.5–8.1)

## 2022-01-24 LAB — LIPASE, BLOOD
Lipase: 397 U/L — ABNORMAL HIGH (ref 11–51)
Lipase: 184 U/L — ABNORMAL HIGH (ref 11–51)

## 2022-01-24 LAB — GLUCOSE, CAPILLARY: Glucose-Capillary: 137 mg/dL — ABNORMAL HIGH (ref 70–99)

## 2022-01-24 MED ORDER — ALUM & MAG HYDROXIDE-SIMETH 200-200-20 MG/5ML PO SUSP
30.0000 mL | Freq: Once | ORAL | Status: AC
  Administered 2022-01-24: 30 mL via ORAL
  Filled 2022-01-24: qty 30

## 2022-01-24 MED ORDER — OXYCODONE HCL 5 MG PO TABS: 5.0000 mg | ORAL_TABLET | ORAL | 0 refills | Status: DC | PRN

## 2022-01-24 MED ORDER — OXYCODONE HCL 5 MG PO TABS
5.0000 mg | ORAL_TABLET | Freq: Once | ORAL | Status: AC
  Administered 2022-01-24: 5 mg via ORAL
  Filled 2022-01-24: qty 1

## 2022-01-24 MED ORDER — LACTATED RINGERS IV BOLUS
1000.0000 mL | Freq: Once | INTRAVENOUS | Status: AC
  Administered 2022-01-24: 1000 mL via INTRAVENOUS

## 2022-01-24 MED ORDER — HYDROMORPHONE HCL 1 MG/ML IJ SOLN
1.0000 mg | Freq: Once | INTRAMUSCULAR | Status: AC
  Administered 2022-01-24: 1 mg via INTRAVENOUS
  Filled 2022-01-24: qty 1

## 2022-01-24 MED ORDER — ONDANSETRON HCL 4 MG/2ML IJ SOLN
4.0000 mg | Freq: Once | INTRAMUSCULAR | Status: AC
  Administered 2022-01-24: 4 mg via INTRAVENOUS
  Filled 2022-01-24: qty 2

## 2022-01-24 MED ORDER — HYDROMORPHONE HCL 1 MG/ML IJ SOLN
0.5000 mg | INTRAMUSCULAR | Status: DC | PRN
  Administered 2022-01-24 – 2022-01-25 (×6): 1 mg via INTRAVENOUS
  Filled 2022-01-24 (×6): qty 1

## 2022-01-24 MED ORDER — ONDANSETRON 4 MG PO TBDP: 4.0000 mg | ORAL_TABLET | Freq: Three times a day (TID) | ORAL | 0 refills | Status: DC | PRN

## 2022-01-24 MED ORDER — IOHEXOL 300 MG/ML  SOLN
100.0000 mL | Freq: Once | INTRAMUSCULAR | Status: AC | PRN
Start: 2022-01-24 — End: 2022-01-24
  Administered 2022-01-24: 100 mL via INTRAVENOUS

## 2022-01-24 MED ORDER — HYDROMORPHONE HCL 1 MG/ML IJ SOLN
1.0000 mg | Freq: Once | INTRAMUSCULAR | Status: DC
  Filled 2022-01-24: qty 1

## 2022-01-24 MED ORDER — LACTATED RINGERS IV SOLN
INTRAVENOUS | Status: AC
Start: 2022-01-24 — End: 2022-01-25

## 2022-01-24 MED ORDER — INSULIN ASPART 100 UNIT/ML IJ SOLN
0.0000 [IU] | INTRAMUSCULAR | Status: DC
  Administered 2022-01-25 – 2022-01-27 (×4): 1 [IU] via SUBCUTANEOUS
  Filled 2022-01-24: qty 0.06

## 2022-01-24 MED ORDER — HEPARIN SODIUM (PORCINE) 5000 UNIT/ML IJ SOLN
5000.0000 [IU] | Freq: Three times a day (TID) | INTRAMUSCULAR | Status: DC
  Administered 2022-01-24 – 2022-01-28 (×11): 5000 [IU] via SUBCUTANEOUS
  Filled 2022-01-24 (×11): qty 1

## 2022-01-24 MED ORDER — HYDROMORPHONE HCL 1 MG/ML IJ SOLN
1.0000 mg | Freq: Once | INTRAMUSCULAR | Status: AC | PRN
  Administered 2022-01-24: 1 mg via INTRAVENOUS
  Filled 2022-01-24: qty 1

## 2022-01-24 NOTE — ED Provider Notes (Signed)
[place] [facility] DEPT Provider Note   CSN: [id number] Arrival date & time: 01/24/22  1518     History  Chief Complaint  Patient presents with   Abdominal Pain   Emesis    Dean Glass is a 46 y.o. male.  HPI 46 year old male with a prior history of pancreatitis thought to be from atorvastatin presents with abdominal pain.  Started 2 days ago.  He was seen around 1 AM admit Dean Glass and had improved with treatment and then went home.  Despite oxycodone the pain is unrelenting.  It is a 9 out of 10.  It is epigastric.  At times he also gets reflux and feels like he has to vomit there is only vomited once, was yesterday.  No fevers.  Some back pain.  Called gastroenterology recommended he come back to the ER.  He doubled up the oxycodone and took it as recently as in the waiting room but no relief.  Home Medications Prior to Admission medications   Medication Sig Start Date End Date Taking? Authorizing Provider  aspirin EC 81 MG tablet Take 81 mg by mouth daily. Swallow whole.   Yes [provider]  metFORMIN (GLUCOPHAGE-XR) 500 MG 24 hr tablet Take 1 tablet (500 mg total) by mouth daily with breakfast. 10/19/21  Yes Plotnikov, Evie Lacks, MD  ondansetron (ZOFRAN-ODT) 4 MG disintegrating tablet Take 1 tablet (4 mg total) by mouth every 8 (eight) hours as needed for nausea or vomiting. 01/24/22  Yes Quintella Reichert, MD  oxyCODONE (ROXICODONE) 5 MG immediate release tablet Take 1 tablet (5 mg total) by mouth every 4 (four) hours as needed for severe pain. 01/24/22  Yes Quintella Reichert, MD  Blood Glucose Monitoring Suppl [facility] VERIO) w/Device KIT 1 Units by Does not apply route daily as needed. 05/11/21   Plotnikov, Evie Lacks, MD  Cholecalciferol (VITAMIN D3) 50 MCG (2000 UT) capsule Take 1 capsule (2,000 Units total) by mouth daily. Patient not taking: Reported on 09/21/2021 09/21/19   Plotnikov, Evie Lacks, MD  glucose blood (ONETOUCH VERIO)  test strip 1 each by Other route daily. 12/02/21   Plotnikov, Evie Lacks, MD  Lancets [facility] ULTRASOFT) lancets Use as instructed 05/11/21   Plotnikov, Evie Lacks, MD  metFORMIN (GLUCOPHAGE) 500 MG tablet Take 500 mg by mouth every morning. Patient not taking: Reported on 01/24/2022 01/02/22   [provider]  zinc gluconate 50 MG tablet Take 50 mg by mouth daily. Patient not taking: Reported on 09/21/2021    [provider]  fluticasone (FLONASE) 50 MCG/ACT nasal spray Place 2 sprays into both nostrils daily. 05/08/20 09/15/20  McVey, Gelene Mink, PA-C      Allergies    Atorvastatin    Review of Systems   Review of Systems  Constitutional:  Negative for fever.  Gastrointestinal:  Positive for abdominal pain, nausea and vomiting.  Musculoskeletal:  Positive for back pain.    Physical Exam Updated Vital Signs BP (!) 146/90 (BP Location: Right Arm)   Pulse (!) 109   Temp 99.3 F (37.4 C) (Oral)   Resp 18   Ht _0  (1.702 m)   Wt 95.3 kg   SpO2 97%   BMI 32.89 kg/m  Physical Exam Vitals and nursing note reviewed.  Constitutional:      General: He is not in acute distress.    Appearance: He is well-developed. He is not ill-appearing or diaphoretic.  HENT:     Head: Normocephalic and atraumatic.  Cardiovascular:     Rate and Rhythm: Normal rate and regular rhythm.     Heart sounds: Normal heart sounds.  Pulmonary:     Effort: Pulmonary effort is normal.     Breath sounds: Normal breath sounds.  Abdominal:     Palpations: Abdomen is soft.     Tenderness: There is abdominal tenderness (mild) in the epigastric area.  Skin:    General: Skin is warm and dry.  Neurological:     Mental Status: He is alert.     ED Results / Procedures / Treatments   Labs (all labs ordered are listed, but only abnormal results are displayed) Labs Reviewed  LIPASE, BLOOD - Abnormal; Notable for the following components:      Result Value   Lipase 184 (*)    All other  components within normal limits  COMPREHENSIVE METABOLIC PANEL - Abnormal; Notable for the following components:   Glucose, Bld 151 (*)    Total Bilirubin 1.6 (*)    All other components within normal limits  CBC - Abnormal; Notable for the following components:   WBC 15.7 (*)    All other components within normal limits  HIV ANTIBODY (ROUTINE TESTING W REFLEX)  HEPATIC FUNCTION PANEL  CBC WITH DIFFERENTIAL/PLATELET  BASIC METABOLIC PANEL    EKG None  Radiology CT ABDOMEN PELVIS W CONTRAST  Result Date: 01/24/2022 CLINICAL DATA:  Abdominal pain, possible pancreatitis EXAM: CT ABDOMEN AND PELVIS WITH CONTRAST TECHNIQUE: Multidetector CT imaging of the abdomen and pelvis was performed using the standard protocol following bolus administration of intravenous contrast. RADIATION DOSE REDUCTION: This exam was performed according to the departmental dose-optimization program which includes automated exposure control, adjustment of the mA and/or kV according to patient size and/or use of iterative reconstruction technique. CONTRAST:  171m OMNIPAQUE IOHEXOL 300 MG/ML  SOLN COMPARISON:  05/07/2021 FINDINGS: Lower chest: Scarring is noted in the bases bilaterally stable from the prior exam. Hepatobiliary: Fatty infiltration of the liver is noted. The gallbladder is within normal limits. Pancreas: Pancreas demonstrates diffuse peripancreatic inflammatory change consistent with acute pancreatitis. No pseudocyst or significant phlegmon is noted. Spleen: Normal in size without focal abnormality. Adrenals/Urinary Tract: The bladder is well distended. Stomach/Bowel: Diverticular change of the colon is noted without evidence of diverticulitis. The appendix is well visualized and within normal limits. No inflammatory changes are seen. The small bowel and stomach are within normal limits. Vascular/Lymphatic: Aortic atherosclerosis. No enlarged abdominal or pelvic lymph nodes. Reproductive: Prostate is  unremarkable. Other: No abdominal wall hernia or abnormality. No abdominopelvic ascites. Musculoskeletal: No acute or significant osseous findings. IMPRESSION: Changes consistent with acute pancreatitis without pseudocyst formation. Fatty liver. Diverticulosis without diverticulitis. Electronically Signed   By: MInez CatalinaM.D.   On: 01/24/2022 19:43    Procedures Procedures    Medications Ordered in ED Medications  insulin aspart (novoLOG) injection 0-6 Units (has no administration in time range)  heparin injection 5,000 Units (has no administration in time range)  lactated ringers infusion (has no administration in time range)  HYDROmorphone (DILAUDID) injection 0.5-1 mg (has no administration in time range)  lactated ringers bolus 1,000 mL (0 mLs Intravenous Stopped 01/24/22 2008)  HYDROmorphone (DILAUDID) injection 1 mg (1 mg Intravenous Given 01/24/22 1854)  ondansetron (ZOFRAN) injection 4 mg (4 mg Intravenous Given 01/24/22 1854)  iohexol (OMNIPAQUE) 300 MG/ML solution 100 mL (100 mLs Intravenous Contrast Given 01/24/22 1859)  HYDROmorphone (DILAUDID) injection 1 mg (1 mg Intravenous Given 01/24/22 2019)  alum & mag  hydroxide-simeth (MAALOX/MYLANTA) 200-200-20 MG/5ML suspension 30 mL (30 mLs Oral Given 01/24/22 2018)  HYDROmorphone (DILAUDID) injection 1 mg (1 mg Intravenous Given 01/24/22 2231)  lactated ringers bolus 1,000 mL (1,000 mLs Intravenous New Bag/Given 01/24/22 2329)    ED Course/ Medical Decision Making/ A&P                           Medical Decision Making Amount and/or Complexity of Data Reviewed Radiology: ordered.  Risk OTC drugs. Prescription drug management. Decision regarding hospitalization.   Lipase is improved but clinically he has persistent pancreatitis with poor pain control. Given multiple doses of Dilaudid.  White blood cell count is slightly higher from yesterday.  CT was obtained and personally viewed/interpreted his images, mild pancreatitis but no  obvious complication.  He will need admission.  Discussed with Dr. Hal Hope.        Final Clinical Impression(s) / ED Diagnoses Final diagnoses:  Acute pancreatitis without infection or necrosis, unspecified pancreatitis type    Rx / DC Orders ED Discharge Orders     None         Sherwood Gambler, MD 01/24/22 2340

## 2022-01-24 NOTE — ED Provider Triage Note (Signed)
Emergency Medicine Provider Triage Evaluation Note  Dean Glass , a 46 y.o. male  was evaluated in triage.  Pt complains of upper abdominal pain since yesterday afternoon.  History of pancreatitis.  Went to [place] late last night, was seen around 1 AM.  He was tolerating p.o. at that time and his pain had improved with medications and was discharged.  He states that the pain medications he was discharged with are not helping his pain now.  He called his GI doctor at [place] and they reported that he should go back to the ER for further evaluation.  Had 1 episode of vomiting today.  Review of Systems  Positive: As above Negative: Fever, chills, diarrhea  Physical Exam  BP (!) 142/100 (BP Location: Left Arm)   Pulse 100   Temp 99 F (37.2 C) (Oral)   Resp 16   Ht '5\' 7"'$  (1.702 m)   Wt 95.3 kg   SpO2 100%   BMI 32.89 kg/m  Gen:   Awake, no distress   Resp:  Normal effort  MSK:   Moves extremities without difficulty  Other:    Medical Decision Making  Medically screening exam initiated at 5:06 PM.  Appropriate orders placed.  KESLER WICKHAM was informed that the remainder of the evaluation will be completed by another provider, this initial triage assessment does not replace that evaluation, and the importance of remaining in the ED until their evaluation is complete.  Pt took home 10 mg oxycodone during triage   Kateri Plummer, PA-C 01/24/22 1707

## 2022-01-24 NOTE — ED Triage Notes (Signed)
Epigastric pain since yesterday evening. Hx pancreatitis. Pt states this feels the same. 1 episode of emesis. Denies other sx.

## 2022-01-24 NOTE — ED Provider Notes (Signed)
[facility] Provider Note   CSN: [id number] Arrival date & time: 01/24/22  0023     History  Chief Complaint  Patient presents with   Abdominal Pain    [facility] is a 46 y.o. male.  The history is provided by the patient and medical records.  Abdominal Pain  [facility] Glass is a 46 y.o. male who presents to the Emergency Department complaining of abdominal pain.  He presents to the emergency department for evaluation of abdominal pain that started Friday night.  Pain is described as both sharp and dull.  It is located in the epigastric and left upper quadrant reason.  He vomited just prior to ED arrival and has nausea at time of assessment.  No hematemesis.  No diarrhea.  No fever.  He states this feels like prior episodes of pancreatitis.  He did try ibuprofen, water for his symptoms.  He has had 2 prior episodes of pancreatitis and atorvastatin was thought to be a contributing factor and this medication was discontinued in January.  He is followed by Dr. Ardis Hughs with gastroenterology.  No prior abdominal surgeries.  He has had prior abdominal imaging with CT abdomen pelvis with contrast, right upper quadrant ultrasound as well as MR/MRCP, studies negative for gallstones. Takes metformin for history of diabetes.  He is a former smoker.  Rare alcohol use.  No drugs.    Home Medications Prior to Admission medications   Medication Sig Start Date End Date Taking? Authorizing Provider  ondansetron (ZOFRAN-ODT) 4 MG disintegrating tablet Take 1 tablet (4 mg total) by mouth every 8 (eight) hours as needed for nausea or vomiting. 01/24/22  Yes Quintella Reichert, MD  oxyCODONE (ROXICODONE) 5 MG immediate release tablet Take 1 tablet (5 mg total) by mouth every 4 (four) hours as needed for severe pain. 01/24/22  Yes Quintella Reichert, MD  aspirin EC 81 MG tablet Take 81 mg by mouth daily. Swallow whole.    [provider]  Blood Glucose Monitoring Suppl  (ONETOUCH VERIO) w/Device KIT 1 Units by Does not apply route daily as needed. 05/11/21   Plotnikov, Evie Lacks, MD  Cholecalciferol (VITAMIN D3) 50 MCG (2000 UT) capsule Take 1 capsule (2,000 Units total) by mouth daily. Patient not taking: Reported on 09/21/2021 09/21/19   Plotnikov, Evie Lacks, MD  glucose blood (ONETOUCH VERIO) test strip 1 each by Other route daily. 12/02/21   Plotnikov, Evie Lacks, MD  Lancets (ONETOUCH ULTRASOFT) lancets Use as instructed 05/11/21   Plotnikov, Evie Lacks, MD  metFORMIN (GLUCOPHAGE-XR) 500 MG 24 hr tablet Take 1 tablet (500 mg total) by mouth daily with breakfast. 10/19/21   Plotnikov, Evie Lacks, MD  zinc gluconate 50 MG tablet Take 50 mg by mouth daily. Patient not taking: Reported on 09/21/2021    [provider]  fluticasone (FLONASE) 50 MCG/ACT nasal spray Place 2 sprays into both nostrils daily. 05/08/20 09/15/20  McVey, Gelene Mink, PA-C      Allergies    Atorvastatin    Review of Systems   Review of Systems  Gastrointestinal:  Positive for abdominal pain.  All other systems reviewed and are negative.   Physical Exam Updated Vital Signs BP (!) 128/93 (BP Location: Left Arm)   Pulse 68   Temp 98.1 F (36.7 C) (Oral)   Resp 17   Ht 5' 7"  (1.702 m)   Wt 95.3 kg   SpO2 95%   BMI 32.89 kg/m  Physical Exam Vitals and nursing  note reviewed.  Constitutional:      Appearance: He is well-developed.  HENT:     Head: Normocephalic and atraumatic.  Cardiovascular:     Rate and Rhythm: Normal rate and regular rhythm.  Pulmonary:     Effort: Pulmonary effort is normal. No respiratory distress.  Abdominal:     Palpations: Abdomen is soft.     Tenderness: There is abdominal tenderness. There is no guarding or rebound.     Comments: Mild epigastric, LUQ tenderness  Musculoskeletal:        General: No tenderness.  Skin:    General: Skin is warm and dry.  Neurological:     Mental Status: He is alert and oriented to person, place, and  time.  Psychiatric:        Behavior: Behavior normal.     ED Results / Procedures / Treatments   Labs (all labs ordered are listed, but only abnormal results are displayed) Labs Reviewed  LIPASE, BLOOD - Abnormal; Notable for the following components:      Result Value   Lipase 397 (*)    All other components within normal limits  COMPREHENSIVE METABOLIC PANEL - Abnormal; Notable for the following components:   Sodium 134 (*)    Glucose, Bld 166 (*)    Total Bilirubin 1.4 (*)    All other components within normal limits  CBC - Abnormal; Notable for the following components:   WBC 14.8 (*)    All other components within normal limits  URINALYSIS, ROUTINE W REFLEX MICROSCOPIC - Abnormal; Notable for the following components:   APPearance HAZY (*)    Hgb urine dipstick TRACE (*)    Bilirubin Urine SMALL (*)    Ketones, ur 80 (*)    All other components within normal limits  URINALYSIS, MICROSCOPIC (REFLEX) - Abnormal; Notable for the following components:   Bacteria, UA RARE (*)    All other components within normal limits    EKG EKG Interpretation  Date/Time:  Sunday January 24 2022 00:44:48 EDT Ventricular Rate:  90 PR Interval:  132 QRS Duration: 84 QT Interval:  338 QTC Calculation: 413 R Axis:   72 Text Interpretation: Normal sinus rhythm with sinus arrhythmia Normal ECG No previous ECGs available Confirmed by Quintella Reichert [phone]) on 01/24/2022 12:46:53 AM  Radiology No results found.  Procedures Procedures    Medications Ordered in ED Medications  HYDROmorphone (DILAUDID) injection 1 mg (1 mg Intravenous Not Given 01/24/22 0432)  HYDROmorphone (DILAUDID) injection 1 mg (1 mg Intravenous Given 01/24/22 0250)  lactated ringers bolus 1,000 mL (0 mLs Intravenous Stopped 01/24/22 0447)  ondansetron (ZOFRAN) injection 4 mg (4 mg Intravenous Given 01/24/22 0250)  HYDROmorphone (DILAUDID) injection 1 mg (1 mg Intravenous Given 01/24/22 0445)  oxyCODONE (Oxy  IR/ROXICODONE) immediate release tablet 5 mg (5 mg Oral Given 01/24/22 0604)    ED Course/ Medical Decision Making/ A&P                           Medical Decision Making Amount and/or Complexity of Data Reviewed Labs: ordered.  Risk Prescription drug management.   Patient with history of recurrent pancreatitis here for evaluation of upper abdominal pain, 1 episode of emesis similar to prior pancreatitis episodes.  Lipase is elevated today, consistent with recurrent pancreatitis.  LFTs are within normal limits.  CBC with mild leukocytosis, similar when compared to prior episode of pancreatitis.  He was treated with IV fluid hydration and pain  medications.  He was able to tolerate oral fluids in the emergency department.  On repeat evaluation after treatment patient is feeling improved.  Current clinical picture is not consistent with gallstone pancreatitis, infected pancreatic pseudocyst, sepsis, SBO, perforated viscus.  Patient is well-established with gastroenterology.  Plan to discharge home with continued oral fluid hydration, GI follow-up and return precautions.        Final Clinical Impression(s) / ED Diagnoses Final diagnoses:  Idiopathic acute pancreatitis without infection or necrosis    Rx / DC Orders ED Discharge Orders          Ordered    ondansetron (ZOFRAN-ODT) 4 MG disintegrating tablet  Every 8 hours PRN        01/24/22 0548    oxyCODONE (ROXICODONE) 5 MG immediate release tablet  Every 4 hours PRN        01/24/22 0554              Quintella Reichert, MD 01/24/22 [phone]

## 2022-01-24 NOTE — Telephone Encounter (Signed)
Received a call from answering service. Called patient back this afternoon. Patient has a history of recurrent current idiopathic pancreatitis that was initially thought to be Lipitor related.  He had been referred for consideration of cholecystectomy but MRI/MRCP was unremarkable and that was held off.  He had been doing well since January and not had any significant issues. He has had acute abdominal pain redevelop for which he ended up going to the emergency department yesterday at [facility] on 7/29.  Laboratories showed a slight elevation in bilirubin but otherwise normal transferase's and alkaline phosphatase.  Patient did have elevation of his lipase into the near 400 range.  No additional imaging was performed.  He was given IV pain medications and then discharged on oxycodone 5 mg tablets. The patient has taken that this morning and felt no benefit by taking a single tablet. Patient is trying to stay hydrated however.  He would like to try to stay at home if possible.  Here is the plan of action that we discussed 1) increase oxycodone to 10 mg every 4 hours as needed 2) start Tylenol 1000 mg every 6 hours as needed 3) stay hydrated 4) if pain is not tolerable or he becomes dehydrated, he needs to just come back to the emergency department for likely intravenous fluids and admission in the setting of persistent/progressive pain consistent with pancreatitis 5) if he comes into the hospital, would recommend repeat imaging, most likely a cross-sectional CT 6) if patient is able to stay out of the hospital, then we will have Dr. Ardis Hughs nurse place laboratories that the patient can come in tomorrow to have completed including a CBC/CMP/ESR/CRP/direct bilirubin as stat labs 7) MD of the day can follow-up laboratories because I believe Dr. Ardis Hughs will be unavailable  Patient and wife are agreeable with this plan of action.   Justice Britain, MD [facility]  Endoscopy Office # [phone]

## 2022-01-24 NOTE — ED Notes (Deleted)
Patient given discharge instructions, all questions answered. Patient in possession of all belongings, directed to the discharge area  

## 2022-01-24 NOTE — ED Notes (Signed)
Attempted to obtain labs, unable pt very dehydrated.

## 2022-01-24 NOTE — ED Triage Notes (Signed)
Patient states he has had epigastric pain since yesterday . Patient reports a history of pancreatitis. Patient states he has vomited x 1 today. Patient reports that he was at [facility] today and was discharged. Patient states pain meds were not working.  Patient called [facility] and states he was told to return to the ED for further evaluation. Patient states he had blood work completed at [facility]

## 2022-01-24 NOTE — H&P (Signed)
History and Physical    [facility] Glass DVV:[id number] DOB: Nov 08, 1975 DOA: 01/24/2022  PCP: Cassandria Anger, MD  Patient coming from: Home.  Chief Complaint: Abdominal pain.  HPI: [facility] is a 46 y.o. male with history of pancreatitis cause not clear follows with [facility] he has been having worsening abdominal pain for the last 3 days.  Pain is seen in the epigastrium radiating to the back with an episode of nausea vomiting.  Had gone to the med Center ER was treated with pain medication and discharged but comes back with worsening pain.  ED Course: In the ER patient had CT abdomen pelvis which shows features consistent with acute pancreatitis with no cirrhosis.  Lipase is elevated.  Patient admitted for further management of acute pancreatitis.  Patient is tachycardic.  Review of Systems: As per HPI, rest all negative.   Past Medical History:  Diagnosis Date   GERD (gastroesophageal reflux disease)    Seasonal allergies     Past Surgical History:  Procedure Laterality Date   VASECTOMY       reports that he quit smoking about 6 years ago. His smoking use included cigarettes. He smoked an average of .5 packs per day. He has quit using smokeless tobacco. He reports current alcohol use. He reports that he does not use drugs.  Allergies  Allergen Reactions   Atorvastatin     Possible Pancreatitis    Family History  Problem Relation Age of Onset   Diabetes Mother    Coronary artery disease Father    Heart disease Father        cad   Early death Brother 62       cystic kidney    Prior to Admission medications   Medication Sig Start Date End Date Taking? Authorizing Provider  aspirin EC 81 MG tablet Take 81 mg by mouth daily. Swallow whole.   Yes [provider]  metFORMIN (GLUCOPHAGE-XR) 500 MG 24 hr tablet Take 1 tablet (500 mg total) by mouth daily with breakfast. 10/19/21  Yes Plotnikov, Evie Lacks, MD  ondansetron (ZOFRAN-ODT) 4 MG  disintegrating tablet Take 1 tablet (4 mg total) by mouth every 8 (eight) hours as needed for nausea or vomiting. 01/24/22  Yes Quintella Reichert, MD  oxyCODONE (ROXICODONE) 5 MG immediate release tablet Take 1 tablet (5 mg total) by mouth every 4 (four) hours as needed for severe pain. 01/24/22  Yes Quintella Reichert, MD  Blood Glucose Monitoring Suppl [facility] VERIO) w/Device KIT 1 Units by Does not apply route daily as needed. 05/11/21   Plotnikov, Evie Lacks, MD  Cholecalciferol (VITAMIN D3) 50 MCG (2000 UT) capsule Take 1 capsule (2,000 Units total) by mouth daily. Patient not taking: Reported on 09/21/2021 09/21/19   Plotnikov, Evie Lacks, MD  glucose blood (ONETOUCH VERIO) test strip 1 each by Other route daily. 12/02/21   Plotnikov, Evie Lacks, MD  Lancets [facility] ULTRASOFT) lancets Use as instructed 05/11/21   Plotnikov, Evie Lacks, MD  metFORMIN (GLUCOPHAGE) 500 MG tablet Take 500 mg by mouth every morning. Patient not taking: Reported on 01/24/2022 01/02/22   [provider]  zinc gluconate 50 MG tablet Take 50 mg by mouth daily. Patient not taking: Reported on 09/21/2021    [provider]  fluticasone (FLONASE) 50 MCG/ACT nasal spray Place 2 sprays into both nostrils daily. 05/08/20 09/15/20  McVey, Gelene Mink, PA-C    Physical Exam: Constitutional: Moderately built and nourished. Vitals:   01/24/22 1631 01/24/22 1857  01/24/22 2030 01/24/22 2042  BP:  (!) 149/90 (!) 140/92 (!) 140/92  Pulse:  (!) 102 100 100  Resp:  _0 Temp:    98.3 F (36.8 C)  TempSrc:    Oral  SpO2:  97% 96% 96%  Weight: 95.3 kg     Height: _1  (1.702 m)      Eyes: Anicteric no pallor. ENMT: No discharge from the ears eyes nose and mouth. Neck: No mass felt.  No neck rigidity. Respiratory: No rhonchi or crepitations. Cardiovascular: S1-S2 heard. Abdomen: Mild epigastric tenderness no guarding or rigidity. Musculoskeletal: No edema. Skin: No rash. Neurologic: Alert awake oriented  to time place and person.  Moves all extremities. Psychiatric: Appears normal.  Normal affect.   Labs on Admission: I have personally reviewed following labs and imaging studies  CBC: Recent Labs  Lab 01/24/22 0109 01/24/22 1846  WBC 14.8* 15.7*  HGB 15.2 14.9  HCT 43.5 42.6  MCV 87.9 88.9  PLT 306 314   Basic Metabolic Panel: Recent Labs  Lab 01/24/22 0109 01/24/22 1846  NA 134* 135  K 3.8 4.0  CL 102 100  CO2 23 24  GLUCOSE 166* 151*  BUN 11 10  CREATININE 0.89 0.83  CALCIUM 9.6 9.4   GFR: Estimated Creatinine Clearance: 122.4 mL/min (by C-G formula based on SCr of 0.83 mg/dL). Liver Function Tests: Recent Labs  Lab 01/24/22 0109 01/24/22 1846  AST 18 15  ALT 25 24  ALKPHOS 61 54  BILITOT 1.4* 1.6*  PROT 8.0 8.0  ALBUMIN 4.5 4.4   Recent Labs  Lab 01/24/22 0109 01/24/22 1846  LIPASE 397* 184*   No results for input(s): "AMMONIA" in the last 168 hours. Coagulation Profile: No results for input(s): "INR", "PROTIME" in the last 168 hours. Cardiac Enzymes: No results for input(s): "CKTOTAL", "CKMB", "CKMBINDEX", "TROPONINI" in the last 168 hours. BNP (last 3 results) No results for input(s): "PROBNP" in the last 8760 hours. HbA1C: No results for input(s): "HGBA1C" in the last 72 hours. CBG: No results for input(s): "GLUCAP" in the last 168 hours. Lipid Profile: No results for input(s): "CHOL", "HDL", "LDLCALC", "TRIG", "CHOLHDL", "LDLDIRECT" in the last 72 hours. Thyroid Function Tests: No results for input(s): "TSH", "T4TOTAL", "FREET4", "T3FREE", "THYROIDAB" in the last 72 hours. Anemia Panel: No results for input(s): "VITAMINB12", "FOLATE", "FERRITIN", "TIBC", "IRON", "RETICCTPCT" in the last 72 hours. Urine analysis:    Component Value Date/Time   COLORURINE YELLOW 01/24/2022 0110   APPEARANCEUR HAZY (A) 01/24/2022 0110   LABSPEC >=1.030 01/24/2022 0110   PHURINE 5.5 01/24/2022 0110   GLUCOSEU NEGATIVE 01/24/2022 0110   GLUCOSEU NEGATIVE  10/19/2021 0842   HGBUR TRACE (A) 01/24/2022 0110   BILIRUBINUR SMALL (A) 01/24/2022 0110   KETONESUR 80 (A) 01/24/2022 0110   PROTEINUR NEGATIVE 01/24/2022 0110   UROBILINOGEN 0.2 10/19/2021 0842   NITRITE NEGATIVE 01/24/2022 0110   LEUKOCYTESUR NEGATIVE 01/24/2022 0110   Sepsis Labs: _2 (procalcitonin:4,lacticidven:4) )No results found for this or any previous visit (from the past 240 hour(s)).   Radiological Exams on Admission: CT ABDOMEN PELVIS W CONTRAST  Result Date: 01/24/2022 CLINICAL DATA:  Abdominal pain, possible pancreatitis EXAM: CT ABDOMEN AND PELVIS WITH CONTRAST TECHNIQUE: Multidetector CT imaging of the abdomen and pelvis was performed using the standard protocol following bolus administration of intravenous contrast. RADIATION DOSE REDUCTION: This exam was performed according to the departmental dose-optimization program which includes automated exposure control, adjustment of the mA and/or kV according to patient size and/or  use of iterative reconstruction technique. CONTRAST:  19m OMNIPAQUE IOHEXOL 300 MG/ML  SOLN COMPARISON:  05/07/2021 FINDINGS: Lower chest: Scarring is noted in the bases bilaterally stable from the prior exam. Hepatobiliary: Fatty infiltration of the liver is noted. The gallbladder is within normal limits. Pancreas: Pancreas demonstrates diffuse peripancreatic inflammatory change consistent with acute pancreatitis. No pseudocyst or significant phlegmon is noted. Spleen: Normal in size without focal abnormality. Adrenals/Urinary Tract: The bladder is well distended. Stomach/Bowel: Diverticular change of the colon is noted without evidence of diverticulitis. The appendix is well visualized and within normal limits. No inflammatory changes are seen. The small bowel and stomach are within normal limits. Vascular/Lymphatic: Aortic atherosclerosis. No enlarged abdominal or pelvic lymph nodes. Reproductive: Prostate is unremarkable. Other: No abdominal wall  hernia or abnormality. No abdominopelvic ascites. Musculoskeletal: No acute or significant osseous findings. IMPRESSION: Changes consistent with acute pancreatitis without pseudocyst formation. Fatty liver. Diverticulosis without diverticulitis. Electronically Signed   By: MInez CatalinaM.D.   On: 01/24/2022 19:43    EKG: Independently reviewed.  Normal sinus rhythm.  Assessment/Plan Principal Problem:   Acute pancreatitis Active Problems:   Diabetes ([place]    Acute pancreatitis -cause not clear.  Follows with [facility].  Prior work-up did not show any definite cause.  We will keep patient n.p.o. and on IV fluids pain relief medication.  We will check troponins triglycerides again.  Consult [facility] for further recommendations.  Follow LFTs.  Follow BUN/creatinine hemoglobin hematocrit. Diabetes mellitus type 2 we will keep patient on sliding scale coverage.  Since patient has acute pancreatitis with significant pain will need close monitoring and inpatient status.   DVT prophylaxis: Heparin subcu. Code Status: Full code. Family Communication: Family at the bedside. Disposition Plan: Home. Consults called: We will consult  GI. Admission status: Inpatient,   ARise PatienceMD [facility] Pager [phone]  If 7PM-7AM, please contact night-coverage [email or web address] Password [email or web address]  01/24/2022, 10:09 PM

## 2022-01-25 DIAGNOSIS — D72829 Elevated white blood cell count, unspecified: Secondary | ICD-10-CM

## 2022-01-25 DIAGNOSIS — K8500 Idiopathic acute pancreatitis without necrosis or infection: Secondary | ICD-10-CM

## 2022-01-25 DIAGNOSIS — E871 Hypo-osmolality and hyponatremia: Secondary | ICD-10-CM

## 2022-01-25 DIAGNOSIS — E1169 Type 2 diabetes mellitus with other specified complication: Secondary | ICD-10-CM | POA: Diagnosis not present

## 2022-01-25 DIAGNOSIS — E669 Obesity, unspecified: Secondary | ICD-10-CM

## 2022-01-25 LAB — HIV ANTIBODY (ROUTINE TESTING W REFLEX): HIV Screen 4th Generation wRfx: NONREACTIVE

## 2022-01-25 LAB — HEPATIC FUNCTION PANEL
ALT: 19 U/L (ref 0–44)
AST: 13 U/L — ABNORMAL LOW (ref 15–41)
Albumin: 3.6 g/dL (ref 3.5–5.0)
Alkaline Phosphatase: 48 U/L (ref 38–126)
Bilirubin, Direct: 0.3 mg/dL — ABNORMAL HIGH (ref 0.0–0.2)
Indirect Bilirubin: 1.4 mg/dL — ABNORMAL HIGH (ref 0.3–0.9)
Total Bilirubin: 1.7 mg/dL — ABNORMAL HIGH (ref 0.3–1.2)
Total Protein: 6.8 g/dL (ref 6.5–8.1)

## 2022-01-25 LAB — CBC WITH DIFFERENTIAL/PLATELET
Abs Immature Granulocytes: 0.08 10*3/uL — ABNORMAL HIGH (ref 0.00–0.07)
Basophils Absolute: 0 10*3/uL (ref 0.0–0.1)
Basophils Relative: 0 %
Eosinophils Absolute: 0 10*3/uL (ref 0.0–0.5)
Eosinophils Relative: 0 %
HCT: 38.3 % — ABNORMAL LOW (ref 39.0–52.0)
Hemoglobin: 13.5 g/dL (ref 13.0–17.0)
Immature Granulocytes: 1 %
Lymphocytes Relative: 10 %
Lymphs Abs: 1.6 10*3/uL (ref 0.7–4.0)
MCH: 31.3 pg (ref 26.0–34.0)
MCHC: 35.2 g/dL (ref 30.0–36.0)
MCV: 88.9 fL (ref 80.0–100.0)
Monocytes Absolute: 1.4 10*3/uL — ABNORMAL HIGH (ref 0.1–1.0)
Monocytes Relative: 9 %
Neutro Abs: 12.9 10*3/uL — ABNORMAL HIGH (ref 1.7–7.7)
Neutrophils Relative %: 80 %
Platelets: 258 10*3/uL (ref 150–400)
RBC: 4.31 MIL/uL (ref 4.22–5.81)
RDW: 12.2 % (ref 11.5–15.5)
WBC: 16.1 10*3/uL — ABNORMAL HIGH (ref 4.0–10.5)
nRBC: 0 % (ref 0.0–0.2)

## 2022-01-25 LAB — BASIC METABOLIC PANEL
Anion gap: 8 (ref 5–15)
BUN: 8 mg/dL (ref 6–20)
CO2: 25 mmol/L (ref 22–32)
Calcium: 8.8 mg/dL — ABNORMAL LOW (ref 8.9–10.3)
Chloride: 100 mmol/L (ref 98–111)
Creatinine, Ser: 0.68 mg/dL (ref 0.61–1.24)
GFR, Estimated: >60 mL/min (ref >60)
Glucose, Bld: 141 mg/dL — ABNORMAL HIGH (ref 70–99)
Potassium: 3.8 mmol/L (ref 3.5–5.1)
Sodium: 133 mmol/L — ABNORMAL LOW (ref 135–145)

## 2022-01-25 LAB — GLUCOSE, CAPILLARY
Glucose-Capillary: 162 mg/dL — ABNORMAL HIGH (ref 70–99)
Glucose-Capillary: 139 mg/dL — ABNORMAL HIGH (ref 70–99)
Glucose-Capillary: 149 mg/dL — ABNORMAL HIGH (ref 70–99)
Glucose-Capillary: 149 mg/dL — ABNORMAL HIGH (ref 70–99)
Glucose-Capillary: 139 mg/dL — ABNORMAL HIGH (ref 70–99)

## 2022-01-25 LAB — TROPONIN I (HIGH SENSITIVITY): Troponin I (High Sensitivity): 3 ng/L (ref <18)

## 2022-01-25 LAB — TRIGLYCERIDES: Triglycerides: 74 mg/dL (ref <150)

## 2022-01-25 MED ORDER — HYDROMORPHONE HCL 1 MG/ML IJ SOLN
1.0000 mg | INTRAMUSCULAR | Status: DC | PRN
  Administered 2022-01-25 (×5): 1 mg via INTRAVENOUS
  Filled 2022-01-25 (×7): qty 1

## 2022-01-25 MED ORDER — OXYCODONE-ACETAMINOPHEN 5-325 MG PO TABS
1.0000 | ORAL_TABLET | ORAL | Status: DC | PRN
  Administered 2022-01-26 – 2022-01-28 (×7): 2 via ORAL
  Filled 2022-01-25 (×9): qty 2

## 2022-01-25 NOTE — Progress Notes (Signed)
PROGRESS NOTE    Dean Glass  EXN:[id number] DOB: March 10, 1976 DOA: 01/24/2022 PCP: Cassandria Anger, MD   Brief Narrative:  46 year old male with history of presented with worsening abdominal pain.  On presentation, CT of abdomen and pelvis showed features consistent with acute pancreatitis with no cirrhosis.  He was started on IV fluids and IV analgesics.  GI has been consulted.  Assessment & Plan:   Acute recurrent pancreatitis -Questionable cause.  Follows up with [facility] as an outpatient; GI consult has been requested.  Follow recommendations -Triglycerides normal at 74. -Continue IV fluids, IV analgesics and antiemetics as needed.  Still in significant pain.  Continue n.p.o. for now. -Trend LFTs  Diabetes mellitus type 2 with  -Continue CBGs with SSI  Hyponatremia -Mild.  Continue IV fluids.  Monitor.  Leukocytosis -Possibly reactive.  Monitor  Obesity -outpatient follow-up    DVT prophylaxis: Heparin subcutaneous Code Status: Full Family Communication: Wife at bedside Disposition Plan: Status is: Inpatient Remains inpatient appropriate because: Of severity of illness    Consultants: GI  Procedures: None  Antimicrobials: None   Subjective: Patient seen and examined at bedside.  Still complains of intermittent severe abdominal pain with some nausea.  No fever, chest pain or shortness of breath reported.  Objective: Vitals:   01/24/22 2042 01/24/22 2330 01/25/22 0204 01/25/22 0619  BP: (!) 140/92 (!) 146/90 (!) 150/99 137/89  Pulse: 100 (!) 109 (!) 108 (!) 109  Resp: '16 18 16 18  '$ Temp: 98.3 F (36.8 C) 99.3 F (37.4 C) 98.9 F (37.2 C) 98.8 F (37.1 C)  TempSrc: Oral Oral Oral Oral  SpO2: 96% 97% 94% 95%  Weight:      Height:        Intake/Output Summary (Last 24 hours) at 01/25/2022 1020 Last data filed at 01/25/2022 1000 Gross per 24 hour  Intake 810.58 ml  Output 850 ml  Net -39.42 ml   Filed Weights   01/24/22 1631  Weight:  95.3 kg    Examination:  General exam: Appears calm and comfortable.  Currently on room air. Respiratory system: Bilateral decreased breath sounds at bases Cardiovascular system: S1 & S2 heard, intermittently tachycardic Gastrointestinal system: Abdomen is distended, soft and tender in the epigastric and periumbilical regions.  Normal bowel sounds heard. Extremities: No cyanosis, clubbing, edema  Central nervous system: Alert and oriented. No focal neurological deficits. Moving extremities Skin: No rashes, lesions or ulcers Psychiatry: Judgement and insight appear normal. Mood & affect appropriate.     Data Reviewed: I have personally reviewed following labs and imaging studies  CBC: Recent Labs  Lab 01/24/22 0109 01/24/22 1846 01/25/22 0158  WBC 14.8* 15.7* 16.1*  NEUTROABS  --   --  12.9*  HGB 15.2 14.9 13.5  HCT 43.5 42.6 38.3*  MCV 87.9 88.9 88.9  PLT 306 277 496   Basic Metabolic Panel: Recent Labs  Lab 01/24/22 0109 01/24/22 1846 01/25/22 0158  NA 134* 135 133*  K 3.8 4.0 3.8  CL 102 100 100  CO2 '23 24 25  '$ GLUCOSE 166* 151* 141*  BUN '11 10 8  '$ CREATININE 0.89 0.83 0.68  CALCIUM 9.6 9.4 8.8*   GFR: Estimated Creatinine Clearance: 127 mL/min (by C-G formula based on SCr of 0.68 mg/dL). Liver Function Tests: Recent Labs  Lab 01/24/22 0109 01/24/22 1846 01/25/22 0158  AST 18 15 13*  ALT '25 24 19  '$ ALKPHOS 61 54 48  BILITOT 1.4* 1.6* 1.7*  PROT 8.0 8.0 6.8  ALBUMIN 4.5 4.4 3.6   Recent Labs  Lab 01/24/22 0109 01/24/22 1846  LIPASE 397* 184*   No results for input(s): "AMMONIA" in the last 168 hours. Coagulation Profile: No results for input(s): "INR", "PROTIME" in the last 168 hours. Cardiac Enzymes: No results for input(s): "CKTOTAL", "CKMB", "CKMBINDEX", "TROPONINI" in the last 168 hours. BNP (last 3 results) No results for input(s): "PROBNP" in the last 8760 hours. HbA1C: No results for input(s): "HGBA1C" in the last 72 hours. CBG: Recent  Labs  Lab 01/24/22 2347 01/25/22 0348 01/25/22 0736  GLUCAP 137* 162* 139*   Lipid Profile: Recent Labs    01/25/22 0158  TRIG 74   Thyroid Function Tests: No results for input(s): "TSH", "T4TOTAL", "FREET4", "T3FREE", "THYROIDAB" in the last 72 hours. Anemia Panel: No results for input(s): "VITAMINB12", "FOLATE", "FERRITIN", "TIBC", "IRON", "RETICCTPCT" in the last 72 hours. Sepsis Labs: No results for input(s): "PROCALCITON", "LATICACIDVEN" in the last 168 hours.  No results found for this or any previous visit (from the past 240 hour(s)).       Radiology Studies: CT ABDOMEN PELVIS W CONTRAST  Result Date: 01/24/2022 CLINICAL DATA:  Abdominal pain, possible pancreatitis EXAM: CT ABDOMEN AND PELVIS WITH CONTRAST TECHNIQUE: Multidetector CT imaging of the abdomen and pelvis was performed using the standard protocol following bolus administration of intravenous contrast. RADIATION DOSE REDUCTION: This exam was performed according to the departmental dose-optimization program which includes automated exposure control, adjustment of the mA and/or kV according to patient size and/or use of iterative reconstruction technique. CONTRAST:  151m OMNIPAQUE IOHEXOL 300 MG/ML  SOLN COMPARISON:  05/07/2021 FINDINGS: Lower chest: Scarring is noted in the bases bilaterally stable from the prior exam. Hepatobiliary: Fatty infiltration of the liver is noted. The gallbladder is within normal limits. Pancreas: Pancreas demonstrates diffuse peripancreatic inflammatory change consistent with acute pancreatitis. No pseudocyst or significant phlegmon is noted. Spleen: Normal in size without focal abnormality. Adrenals/Urinary Tract: The bladder is well distended. Stomach/Bowel: Diverticular change of the colon is noted without evidence of diverticulitis. The appendix is well visualized and within normal limits. No inflammatory changes are seen. The small bowel and stomach are within normal limits.  Vascular/Lymphatic: Aortic atherosclerosis. No enlarged abdominal or pelvic lymph nodes. Reproductive: Prostate is unremarkable. Other: No abdominal wall hernia or abnormality. No abdominopelvic ascites. Musculoskeletal: No acute or significant osseous findings. IMPRESSION: Changes consistent with acute pancreatitis without pseudocyst formation. Fatty liver. Diverticulosis without diverticulitis. Electronically Signed   By: MInez CatalinaM.D.   On: 01/24/2022 19:43        Scheduled Meds:  heparin  5,000 Units Subcutaneous Q8H   insulin aspart  0-6 Units Subcutaneous Q4H   Continuous Infusions:  lactated ringers 150 mL/hr at 01/25/22 0646          KAline August MD [facility] 01/25/2022, 10:20 AM

## 2022-01-25 NOTE — Telephone Encounter (Signed)
The pt has been admitted into the hospital.

## 2022-01-25 NOTE — Progress Notes (Signed)
  Transition of Care [facility]) Screening Note   Patient Details  Name: Dean Glass Date of Birth: 24-Oct-1975   Transition of Care [facility]) CM/SW Contact:    Lennart Pall, LCSW Phone Number: 01/25/2022, 1:52 PM    Transition of Care Department [facility]) has reviewed patient and no TOC needs have been identified at this time. We will continue to monitor patient advancement through interdisciplinary progression rounds. If new patient transition needs arise, please place a TOC consult.

## 2022-01-25 NOTE — Consult Note (Addendum)
Consultation  Referring Provider:   General surgery Primary Care Physician:  Cassandria Anger, MD Primary Gastroenterologist:  Dr. Ardis Hughs       Reason for Consultation:     Acute pancreatitis   Impression    Acute Pancreatitis, recurrent, unknown cause 01/24/2022 LIPASE 184 01/25/2022 WBC 16.1 HGB 13.5  01/25/2022 BUN 8 Cr 0.68 - stable 01/25/2022 AST 13 ALT 19 Alkphos 48 TBili 1.7 Repeat trigs negative 08/2021 MRCP normal liver, normal gallbladder somewhat atrophic pancreas with small cystic foci measuring up to 7 mm throughout the pancreas, normal main pancreatic duct surveillance MRI in 1 year  2 diabetes Question need to stop metformin? On sliding scale insulin  GERD No melena  Constipation Last BM was Friday, has had decreased PO    Plan   Patient's had 2 L bolus currently at 150 mL/h still having some dizziness with standing-we will increase to 175 mL/hr Daily CMET, CBC  Brother with FSGS kidney disease and history of pancreatitis.  Will get ANA, had negative IGG4 Consider Auto-antibodies against Anhydrase II & Lactoferrin Consider PRSS1, SPINK1, CFTR Gene mutations especially with family history of pancreatitis/kidney disease - consider stopping metformin- is class 3 for pancreatitis.  -Pain control per the inpatient medical team. -Encourage early ambulation - start with clear liquids, Advance diet as tolerated.   -Repeat CT scan if concern for necrotizing pancreatitis, pseudocyst, or abscess -Alcohol cessation counseling.  Thank you for your kind consultation, we will continue to follow.         HPI:   Dean Glass is a 46 y.o. male with past medical history significant for GERD, allergies presents to the ER with acute pancreatitis.  Patient has been having recurrent acute pancreatitis with unclear etiology.   Had episode November 2022, January 2023. Ultrasound 06/2021 no gallstones, no ductal dilatation. 08/2021 MRCP normal liver, normal  gallbladder somewhat atrophic pancreas with small cystic foci measuring up to 7 mm throughout the pancreas, normal main pancreatic duct surveillance MRI in 1 year Patient states he has rare alcohol use patient is former smoker quit in 2016,, normal triglycerides 03/2021, normal IgG4. Patient was on Lipitor and this was discontinued in January for possibility being associate with pancreatitis. Grandmother with pancreatic cancer and she was significant alcoholic.  Uncle with colon cancer. Brother with FSGS kidney disease on diaylsis, had pancreatitis with cyst that "killed him" age 66.   01/24/2022 went to the ER with abdominal pain associate with nausea and vomiting. Eyes fever, diarrhea, hematemesis. Lipase in the ER 397, glucose 166, white blood cell count 14.8 LFTs unremarkable. CT abdomen pelvis with contrast consistent with acute pancreatitis without pseudocyst formation, fatty liver, diverticulosis.  Patient states he was doing well without any episodes of abdominal pain until this past Friday evening 5 PM. States last alcohol was 2 to 3 weeks ago while growling had 1-2 beers. Has been off Lipitor since January, had been on metformin started in January.  Denies any new medications, over-the-counter medications, NSAIDs.  Drug use. Patient states started with epigastric pain no radiation to back with nausea 1 episode of vomiting.  His last bowel movement was Friday morning denies melena or hematochezia.  Has had decreased appetite but at home has been tolerating liquids and Jell-O okay.  Has not been moving much while here has been having out slight dizziness with standing with increased heart rate.  Patient is on 150 cc.  Brother with history of FSGS kidney disease and pancreatitis.  Past Medical History:  Diagnosis Date   GERD (gastroesophageal reflux disease)    Seasonal allergies     Surgical History:  He  has a past surgical history that includes Vasectomy. Family History:  His family  history includes Coronary artery disease in his father; Diabetes in his mother; Early death (age of onset: 28) in his brother; Heart disease in his father. Social History:   reports that he quit smoking about 6 years ago. His smoking use included cigarettes. He smoked an average of .5 packs per day. He has quit using smokeless tobacco. He reports current alcohol use. He reports that he does not use drugs.  Prior to Admission medications   Medication Sig Start Date End Date Taking? Authorizing Provider  aspirin EC 81 MG tablet Take 81 mg by mouth daily. Swallow whole.   Yes [provider]  metFORMIN (GLUCOPHAGE-XR) 500 MG 24 hr tablet Take 1 tablet (500 mg total) by mouth daily with breakfast. 10/19/21  Yes Plotnikov, Evie Lacks, MD  ondansetron (ZOFRAN-ODT) 4 MG disintegrating tablet Take 1 tablet (4 mg total) by mouth every 8 (eight) hours as needed for nausea or vomiting. 01/24/22  Yes Quintella Reichert, MD  oxyCODONE (ROXICODONE) 5 MG immediate release tablet Take 1 tablet (5 mg total) by mouth every 4 (four) hours as needed for severe pain. 01/24/22  Yes Quintella Reichert, MD  Blood Glucose Monitoring Suppl [facility] VERIO) w/Device KIT 1 Units by Does not apply route daily as needed. 05/11/21   Plotnikov, Evie Lacks, MD  Cholecalciferol (VITAMIN D3) 50 MCG (2000 UT) capsule Take 1 capsule (2,000 Units total) by mouth daily. Patient not taking: Reported on 09/21/2021 09/21/19   Plotnikov, Evie Lacks, MD  glucose blood (ONETOUCH VERIO) test strip 1 each by Other route daily. 12/02/21   Plotnikov, Evie Lacks, MD  Lancets [facility] ULTRASOFT) lancets Use as instructed 05/11/21   Plotnikov, Evie Lacks, MD  metFORMIN (GLUCOPHAGE) 500 MG tablet Take 500 mg by mouth every morning. Patient not taking: Reported on 01/24/2022 01/02/22   [provider]  zinc gluconate 50 MG tablet Take 50 mg by mouth daily. Patient not taking: Reported on 09/21/2021    [provider]  fluticasone (FLONASE) 50  MCG/ACT nasal spray Place 2 sprays into both nostrils daily. 05/08/20 09/15/20  McVey, Gelene Mink, PA-C    Current Facility-Administered Medications  Medication Dose Route Frequency Provider Last Rate Last Admin   heparin injection 5,000 Units  5,000 Units Subcutaneous Q8H Rise Patience, MD   5,000 Units at 01/25/22 [phone]   HYDROmorphone (DILAUDID) injection 1 mg  1 mg Intravenous Q2H PRN Aline August, MD       insulin aspart (novoLOG) injection 0-6 Units  0-6 Units Subcutaneous Q4H Rise Patience, MD   1 Units at 01/25/22 0405   lactated ringers infusion   Intravenous Continuous Rise Patience, MD 150 mL/hr at 01/25/22 0646 New Bag at 01/25/22 0646    Allergies as of 01/24/2022 - Review Complete 01/24/2022  Allergen Reaction Noted   Atorvastatin  07/30/2021    Review of Systems:    Constitutional: No weight loss, fever, chills, weakness or fatigue HEENT: Eyes: No change in vision               Ears, Nose, Throat:  No change in hearing or congestion Skin: No rash or itching Cardiovascular: No chest pain, chest pressure or palpitations   Respiratory: No SOB or cough Gastrointestinal: See HPI and otherwise negative Genitourinary: No  dysuria or change in urinary frequency Neurological: No headache, dizziness or syncope Musculoskeletal: No new muscle or joint pain Hematologic: No bleeding or bruising Psychiatric: No history of depression or anxiety     Physical Exam:  Vital signs in last 24 hours: Temp:  [98.3 F (36.8 C)-99.3 F (37.4 C)] 98.8 F (37.1 C) (07/31 0619) Pulse Rate:  [100-109] 109 (07/31 0619) Resp:  [16-18] 18 (07/31 0619) BP: (137-150)/(89-100) 137/89 (07/31 0619) SpO2:  [94 %-100 %] 95 % (07/31 0619) Weight:  [95.3 kg] 95.3 kg (07/30 1631) Last BM Date : 01/22/22 Last BM recorded by nurses in past 5 days No data recorded  General:   Pleasant, well developed male in no acute distress Head:  Normocephalic and atraumatic. Eyes:  sclerae anicteric,conjunctive pink  Heart:  regular rate and rhythm Pulm: Clear anteriorly; no wheezing Abdomen:  Soft, Obese AB, Active bowel sounds. mild tenderness in the epigastrium. Without guarding and Without rebound, No organomegaly appreciated. Extremities:  Without edema. Msk:  Symmetrical without gross deformities. Peripheral pulses intact.  Neurologic:  Alert and  oriented x4;  No focal deficits.  Skin:   Dry and intact without significant lesions or rashes. Psychiatric:  Cooperative. Normal mood and affect.  LAB RESULTS: Recent Labs    01/24/22 0109 01/24/22 1846 01/25/22 0158  WBC 14.8* 15.7* 16.1*  HGB 15.2 14.9 13.5  HCT 43.5 42.6 38.3*  PLT 306 277 258   BMET Recent Labs    01/24/22 0109 01/24/22 1846 01/25/22 0158  NA 134* 135 133*  K 3.8 4.0 3.8  CL 102 100 100  CO2 _0 GLUCOSE 166* 151* 141*  BUN _1 CREATININE 0.89 0.83 0.68  CALCIUM 9.6 9.4 8.8*   LFT Recent Labs    01/25/22 0158  PROT 6.8  ALBUMIN 3.6  AST 13*  ALT 19  ALKPHOS 48  BILITOT 1.7*  BILIDIR 0.3*  IBILI 1.4*   PT/INR No results for input(s): "LABPROT", "INR" in the last 72 hours.  STUDIES: CT ABDOMEN PELVIS W CONTRAST  Result Date: 01/24/2022 CLINICAL DATA:  Abdominal pain, possible pancreatitis EXAM: CT ABDOMEN AND PELVIS WITH CONTRAST TECHNIQUE: Multidetector CT imaging of the abdomen and pelvis was performed using the standard protocol following bolus administration of intravenous contrast. RADIATION DOSE REDUCTION: This exam was performed according to the departmental dose-optimization program which includes automated exposure control, adjustment of the mA and/or kV according to patient size and/or use of iterative reconstruction technique. CONTRAST:  185m OMNIPAQUE IOHEXOL 300 MG/ML  SOLN COMPARISON:  05/07/2021 FINDINGS: Lower chest: Scarring is noted in the bases bilaterally stable from the prior exam. Hepatobiliary: Fatty infiltration of the liver is noted.  The gallbladder is within normal limits. Pancreas: Pancreas demonstrates diffuse peripancreatic inflammatory change consistent with acute pancreatitis. No pseudocyst or significant phlegmon is noted. Spleen: Normal in size without focal abnormality. Adrenals/Urinary Tract: The bladder is well distended. Stomach/Bowel: Diverticular change of the colon is noted without evidence of diverticulitis. The appendix is well visualized and within normal limits. No inflammatory changes are seen. The small bowel and stomach are within normal limits. Vascular/Lymphatic: Aortic atherosclerosis. No enlarged abdominal or pelvic lymph nodes. Reproductive: Prostate is unremarkable. Other: No abdominal wall hernia or abnormality. No abdominopelvic ascites. Musculoskeletal: No acute or significant osseous findings. IMPRESSION: Changes consistent with acute pancreatitis without pseudocyst formation. Fatty liver. Diverticulosis without diverticulitis. Electronically Signed   By: MInez CatalinaM.D.   On: 01/24/2022 19:43     AUvaldo Bristle  Silverio Lay  01/25/2022, 11:18 AM  --------------------------------------------------  I have taken a history, reviewed the chart and examined the patient. I performed a substantive portion of this encounter, including complete performance of at least one of the key components, in conjunction with the APP. I agree with the APP's note, impression and recommendations  46 year old male with recurrent acute pancreatitis of unclear etiology.  Patient reports last alcohol ingestion was 2 3 weeks ago and has no history of any heavy chronic alcohol use.  No other lab values to suggest underlying heavy alcohol use (AST predominant liver enzymes, macrocytosis).  He had been previously evaluated by surgery for consideration of cholecystectomy, but this was felt not to be the source of his pancreatitis and cholecystectomy was not pursued.  Testing for autoimmune pancreatitis has been unremarkable.  He does have a  family history of pancreatic cancer (grandmother) and likely pancreatitis and his brother.  His pancreatitis appears uncomplicated thus far.  Recommend continuing to treat supportively with pain control (patient states that current regiment is inadequate given short duration of pain control), IV fluids (recommended 1.5 mils per kilogram per hour), continue to monitor urine output. Agree with clear liquid diet, advance as tolerated.  If if his oral intake remains poor over the next 48 hours, consider nasogastric feeding.  Given the unclear etiology of his pancreatitis, I would recommend assessing for genetic causes of pancreatitis (SPINK, PRSS1, CFTR).  If negative, patient may need to be reconsidered for prophylactic cholecystectomy  Xiao Graul E. Candis Schatz, MD [facility]

## 2022-01-26 DIAGNOSIS — E871 Hypo-osmolality and hyponatremia: Secondary | ICD-10-CM

## 2022-01-26 DIAGNOSIS — D72829 Elevated white blood cell count, unspecified: Secondary | ICD-10-CM

## 2022-01-26 DIAGNOSIS — K8500 Idiopathic acute pancreatitis without necrosis or infection: Secondary | ICD-10-CM | POA: Diagnosis not present

## 2022-01-26 DIAGNOSIS — E669 Obesity, unspecified: Secondary | ICD-10-CM | POA: Diagnosis not present

## 2022-01-26 DIAGNOSIS — K859 Acute pancreatitis without necrosis or infection, unspecified: Secondary | ICD-10-CM | POA: Diagnosis not present

## 2022-01-26 LAB — CBC WITH DIFFERENTIAL/PLATELET
Abs Immature Granulocytes: 0.09 10*3/uL — ABNORMAL HIGH (ref 0.00–0.07)
Basophils Absolute: 0 10*3/uL (ref 0.0–0.1)
Basophils Relative: 0 %
Eosinophils Absolute: 0.1 10*3/uL (ref 0.0–0.5)
Eosinophils Relative: 1 %
HCT: 40 % (ref 39.0–52.0)
Hemoglobin: 13.7 g/dL (ref 13.0–17.0)
Immature Granulocytes: 1 %
Lymphocytes Relative: 16 %
Lymphs Abs: 2.4 10*3/uL (ref 0.7–4.0)
MCH: 31 pg (ref 26.0–34.0)
MCHC: 34.3 g/dL (ref 30.0–36.0)
MCV: 90.5 fL (ref 80.0–100.0)
Monocytes Absolute: 1.5 10*3/uL — ABNORMAL HIGH (ref 0.1–1.0)
Monocytes Relative: 10 %
Neutro Abs: 10.8 10*3/uL — ABNORMAL HIGH (ref 1.7–7.7)
Neutrophils Relative %: 72 %
Platelets: 219 10*3/uL (ref 150–400)
RBC: 4.42 MIL/uL (ref 4.22–5.81)
RDW: 12.3 % (ref 11.5–15.5)
WBC: 14.8 10*3/uL — ABNORMAL HIGH (ref 4.0–10.5)
nRBC: 0 % (ref 0.0–0.2)

## 2022-01-26 LAB — COMPREHENSIVE METABOLIC PANEL
ALT: 18 U/L (ref 0–44)
AST: 13 U/L — ABNORMAL LOW (ref 15–41)
Albumin: 3.5 g/dL (ref 3.5–5.0)
Alkaline Phosphatase: 51 U/L (ref 38–126)
Anion gap: 6 (ref 5–15)
BUN: 7 mg/dL (ref 6–20)
CO2: 27 mmol/L (ref 22–32)
Calcium: 9.1 mg/dL (ref 8.9–10.3)
Chloride: 103 mmol/L (ref 98–111)
Creatinine, Ser: 0.71 mg/dL (ref 0.61–1.24)
GFR, Estimated: >60 mL/min (ref >60)
Glucose, Bld: 137 mg/dL — ABNORMAL HIGH (ref 70–99)
Potassium: 4 mmol/L (ref 3.5–5.1)
Sodium: 136 mmol/L (ref 135–145)
Total Bilirubin: 1.2 mg/dL (ref 0.3–1.2)
Total Protein: 7.2 g/dL (ref 6.5–8.1)

## 2022-01-26 LAB — GLUCOSE, CAPILLARY
Glucose-Capillary: 113 mg/dL — ABNORMAL HIGH (ref 70–99)
Glucose-Capillary: 123 mg/dL — ABNORMAL HIGH (ref 70–99)
Glucose-Capillary: 143 mg/dL — ABNORMAL HIGH (ref 70–99)
Glucose-Capillary: 147 mg/dL — ABNORMAL HIGH (ref 70–99)
Glucose-Capillary: 152 mg/dL — ABNORMAL HIGH (ref 70–99)
Glucose-Capillary: 163 mg/dL — ABNORMAL HIGH (ref 70–99)
Glucose-Capillary: 167 mg/dL — ABNORMAL HIGH (ref 70–99)

## 2022-01-26 LAB — ANA W/REFLEX IF POSITIVE: Anti Nuclear Antibody (ANA): NEGATIVE

## 2022-01-26 LAB — MAGNESIUM: Magnesium: 2.1 mg/dL (ref 1.7–2.4)

## 2022-01-26 MED ORDER — SODIUM CHLORIDE 0.9 % IV SOLN: INTRAVENOUS | Status: DC

## 2022-01-26 NOTE — Progress Notes (Addendum)
[facility] Progress Note  CC: Acute pancreatitis  Subjective: He is having a less abdominal pain since starting Percocet yesterday. He tolerated a clear liquid diet this morning without nausea or vomiting.  Overall, he feels better today.  No BM x 3 days, he is passing gas per the rectum.  His wife is at the bedside and questions if they should cancel their plans for a cruise in 2 weeks.   Objective:  Vital signs in last 24 hours: Temp:  [98.7 F (37.1 C)-99.9 F (37.7 C)] 98.7 F (37.1 C) (08/01 0627) Pulse Rate:  [100-107] 100 (08/01 0627) Resp:  [18] 18 (08/01 0627) BP: (136-165)/(80-98) 149/98 (08/01 0627) SpO2:  [93 %-94 %] 93 % (08/01 0627) Last BM Date : 01/22/22 General:  Alert, well-developed in NAD Heart: Regular rate and rhythm, no murmurs. Pulm: Breath sounds clear throughout. Abdomen: Soft, mild distention.  Nontender.  Positive bowel sounds all 4 quadrants.  No masses Extremities:  Without edema. Neurologic:  Alert and  oriented x 4. Grossly normal neurologically. Psych:  Alert and cooperative. Normal mood and affect.  Intake/Output from previous day: 07/31 0701 - 08/01 0700 In: 4083.3 [P.O.:1600; I.V.:2483.3] Out: 4950 [Urine:4950] Intake/Output this shift: No intake/output data recorded.  Lab Results: Recent Labs    01/24/22 1846 01/25/22 0158 01/26/22 0405  WBC 15.7* 16.1* 14.8*  HGB 14.9 13.5 13.7  HCT 42.6 38.3* 40.0  PLT 277 258 219   BMET Recent Labs    01/24/22 1846 01/25/22 0158 01/26/22 0405  NA 135 133* 136  K 4.0 3.8 4.0  CL 100 100 103  CO2 24 25 27   GLUCOSE 151* 141* 137*  BUN 10 8 7   CREATININE 0.83 0.68 0.71  CALCIUM 9.4 8.8* 9.1   LFT Recent Labs    01/25/22 0158 01/26/22 0405  PROT 6.8 7.2  ALBUMIN 3.6 3.5  AST 13* 13*  ALT 19 18  ALKPHOS 48 51  BILITOT 1.7* 1.2  BILIDIR 0.3*  --   IBILI 1.4*  --    PT/INR No results for input(s): "LABPROT", "INR" in the last 72 hours. Hepatitis Panel No  results for input(s): "HEPBSAG", "HCVAB", "HEPAIGM", "HEPBIGM" in the last 72 hours.  CT ABDOMEN PELVIS W CONTRAST  Result Date: 01/24/2022 CLINICAL DATA:  Abdominal pain, possible pancreatitis EXAM: CT ABDOMEN AND PELVIS WITH CONTRAST TECHNIQUE: Multidetector CT imaging of the abdomen and pelvis was performed using the standard protocol following bolus administration of intravenous contrast. RADIATION DOSE REDUCTION: This exam was performed according to the departmental dose-optimization program which includes automated exposure control, adjustment of the mA and/or kV according to patient size and/or use of iterative reconstruction technique. CONTRAST:  162m OMNIPAQUE IOHEXOL 300 MG/ML  SOLN COMPARISON:  05/07/2021 FINDINGS: Lower chest: Scarring is noted in the bases bilaterally stable from the prior exam. Hepatobiliary: Fatty infiltration of the liver is noted. The gallbladder is within normal limits. Pancreas: Pancreas demonstrates diffuse peripancreatic inflammatory change consistent with acute pancreatitis. No pseudocyst or significant phlegmon is noted. Spleen: Normal in size without focal abnormality. Adrenals/Urinary Tract: The bladder is well distended. Stomach/Bowel: Diverticular change of the colon is noted without evidence of diverticulitis. The appendix is well visualized and within normal limits. No inflammatory changes are seen. The small bowel and stomach are within normal limits. Vascular/Lymphatic: Aortic atherosclerosis. No enlarged abdominal or pelvic lymph nodes. Reproductive: Prostate is unremarkable. Other: No abdominal wall hernia or abnormality. No abdominopelvic ascites. Musculoskeletal: No acute or significant osseous findings. IMPRESSION:  Changes consistent with acute pancreatitis without pseudocyst formation. Fatty liver. Diverticulosis without diverticulitis. Electronically Signed   By: Inez Catalina M.D.   On: 01/24/2022 19:43    Assessment / Plan:  26) 46 year old male with  recurrent acute pancreatitis, etiology unknown.  No history of alcohol use disorder.  Normal IgG4 level 06/2021.  ANA negative. Triglyceride level 74.  Lipase 387 -> 184. Total bili 1.2.  Alk phos 51.  AST 13.  ALT 18.  CTAP with contrast 01/24/2022 identified diffuse peripancreatic inflammatory changes consistent with acute pancreatitis without evidence of pseudocyst.  Abdominal MRI/MRCP 08/2021 identified pancreatic atrophy with a few small cystic foci scattered throughout the pancreas measuring up to 7 mm in the tail anteriorly with a normal pancreatic duct. -Continue IV fluids @ 125cc/hr -Continue clear liquids for now, advance as tolerated -Pain management per the hospitalist -Consider eventual  repeat abdominal MRI/MRCP vs EUS to further evaluate the pancreas and ampulla?  Await further recommendations per Dr. Candis Schatz. -CFTR, PRSS1, SPINK1 (family history of pancreatitis/kidney disease) -If genetic work up negative,  patient may need to be reconsidered for prophylactic cholecystectomy -Lipase level in am  2) GERD -Pantoprazole 40 mg p.o. daily  3) Leukocytosis, likely from acute pancreatitis.  WBC 16.1 -> 14.8 .  He remains afebrile.  4) DM II  Principal Problem:   Acute pancreatitis Active Problems:   Diabetes (HCC)   Leukocytosis   Hyponatremia   Obesity (BMI 30-39.9)     LOS: 2 days   Noralyn Pick  01/26/2022, 10:00AM  --------------------------------------------------------------------  I have taken a history, reviewed the chart and examined the patient. I performed a substantive portion of this encounter, including complete performance of at least one of the key components, in conjunction with the APP. I agree with the APP's note, impression and recommendations  Patient improving clinically.  Tolerating liquid diet well without any worsening of symptoms.  Pain improved. Consider advancing to full liquid or bland diet tomorrow if still doing well Patient may  need laxative if no BM in next 24 hrs He can follow up as outpatient with Dr. Ardis Hughs to consider repeat referral to surgery vs EUS to assess for microlithiasis, assuming genetic tests negative Recommend complete alcohol abstinence to eliminate that as possible cause.   Zaccai Chavarin E. Candis Schatz, MD [facility]

## 2022-01-26 NOTE — Progress Notes (Signed)
PROGRESS NOTE    Dean Glass  NWG:[id number] DOB: 1976-03-15 DOA: 01/24/2022 PCP: Cassandria Anger, MD   Brief Narrative:  46 year old male with history of presented with worsening abdominal pain.  On presentation, CT of abdomen and pelvis showed features consistent with acute pancreatitis with no cirrhosis.  He was started on IV fluids and IV analgesics.  GI was consulted.  Assessment & Plan:   Acute recurrent pancreatitis -Questionable cause. -Triglycerides normal at 74. -Continue IV fluids, IV analgesics and antiemetics as needed.  Abdominal pain improving but still significant.  Currently on clear liquid diet. GI following. Diet advancement as per GI. -Trend LFTs  Diabetes mellitus type 2 with  -Continue CBGs with SSI  Hyponatremia -Resolved.  Monitor.  Leukocytosis -Possibly reactive.  Slightly improved today.  Monitor  Obesity -outpatient follow-up    DVT prophylaxis: Heparin subcutaneous Code Status: Full Family Communication: Wife at bedside Disposition Plan: Status is: Inpatient Remains inpatient appropriate because: Of severity of illness    Consultants: GI  Procedures: None  Antimicrobials: None   Subjective: Patient seen and examined at bedside.  Abdominal pain improving but still intermittently requiring IV pain medications.  No overnight worsening shortness of breath, fever, chest pain reported. Objective: Vitals:   01/25/22 0619 01/25/22 1350 01/25/22 2050 01/26/22 0627  BP: 137/89 (!) 165/86 136/80 (!) 149/98  Pulse: (!) 109 (!) 104 (!) 107 100  Resp: '18 18 18 18  '$ Temp: 98.8 F (37.1 C) 98.9 F (37.2 C) 99.9 F (37.7 C) 98.7 F (37.1 C)  TempSrc: Oral Oral Oral Oral  SpO2: 95% 94% 93% 93%  Weight:      Height:        Intake/Output Summary (Last 24 hours) at 01/26/2022 0723 Last data filed at 01/26/2022 0600 Gross per 24 hour  Intake 4083.28 ml  Output 4950 ml  Net -866.72 ml    Filed Weights   01/24/22 1631  Weight:  95.3 kg    Examination:  General: No acute distress, currently on room air ENT/neck: No elevated JVD.  No obvious masses  respiratory: Bilateral decreased breath sounds at bases with some scattered crackles CVS: S1-S2 heard, still tachycardic  abdominal: Soft, tender in the epigastric and periumbilical regions, distended slightly, no organomegaly, bowel sounds heard Extremities: No cyanosis, clubbing, edema CNS: Alert, awake and oriented.  No focal neurologic deficit.  Moving extremities. Lymph: No cervical lymphadenopathy Skin: No rashes, lesions, ulcers Psych: Normal mood, affect and judgment Musculoskeletal: No obvious joint deformity/tenderness/swelling    Data Reviewed: I have personally reviewed following labs and imaging studies  CBC: Recent Labs  Lab 01/24/22 0109 01/24/22 1846 01/25/22 0158 01/26/22 0405  WBC 14.8* 15.7* 16.1* 14.8*  NEUTROABS  --   --  12.9* 10.8*  HGB 15.2 14.9 13.5 13.7  HCT 43.5 42.6 38.3* 40.0  MCV 87.9 88.9 88.9 90.5  PLT 306 277 258 578    Basic Metabolic Panel: Recent Labs  Lab 01/24/22 0109 01/24/22 1846 01/25/22 0158 01/26/22 0405  NA 134* 135 133* 136  K 3.8 4.0 3.8 4.0  CL 102 100 100 103  CO2 '23 24 25 27  '$ GLUCOSE 166* 151* 141* 137*  BUN '11 10 8 7  '$ CREATININE 0.89 0.83 0.68 0.71  CALCIUM 9.6 9.4 8.8* 9.1  MG  --   --   --  2.1    GFR: Estimated Creatinine Clearance: 127 mL/min (by C-G formula based on SCr of 0.71 mg/dL). Liver Function Tests: Recent Labs  Lab 01/24/22  0109 01/24/22 1846 01/25/22 0158 01/26/22 0405  AST 18 15 13* 13*  ALT '25 24 19 18  '$ ALKPHOS 61 54 48 51  BILITOT 1.4* 1.6* 1.7* 1.2  PROT 8.0 8.0 6.8 7.2  ALBUMIN 4.5 4.4 3.6 3.5    Recent Labs  Lab 01/24/22 0109 01/24/22 1846  LIPASE 397* 184*    No results for input(s): "AMMONIA" in the last 168 hours. Coagulation Profile: No results for input(s): "INR", "PROTIME" in the last 168 hours. Cardiac Enzymes: No results for input(s):  "CKTOTAL", "CKMB", "CKMBINDEX", "TROPONINI" in the last 168 hours. BNP (last 3 results) No results for input(s): "PROBNP" in the last 8760 hours. HbA1C: No results for input(s): "HGBA1C" in the last 72 hours. CBG: Recent Labs  Lab 01/25/22 1150 01/25/22 1603 01/25/22 1957 01/26/22 0008 01/26/22 0400  GLUCAP 149* 149* 139* 152* 147*    Lipid Profile: Recent Labs    01/25/22 0158  TRIG 74    Thyroid Function Tests: No results for input(s): "TSH", "T4TOTAL", "FREET4", "T3FREE", "THYROIDAB" in the last 72 hours. Anemia Panel: No results for input(s): "VITAMINB12", "FOLATE", "FERRITIN", "TIBC", "IRON", "RETICCTPCT" in the last 72 hours. Sepsis Labs: No results for input(s): "PROCALCITON", "LATICACIDVEN" in the last 168 hours.  No results found for this or any previous visit (from the past 240 hour(s)).       Radiology Studies: CT ABDOMEN PELVIS W CONTRAST  Result Date: 01/24/2022 CLINICAL DATA:  Abdominal pain, possible pancreatitis EXAM: CT ABDOMEN AND PELVIS WITH CONTRAST TECHNIQUE: Multidetector CT imaging of the abdomen and pelvis was performed using the standard protocol following bolus administration of intravenous contrast. RADIATION DOSE REDUCTION: This exam was performed according to the departmental dose-optimization program which includes automated exposure control, adjustment of the mA and/or kV according to patient size and/or use of iterative reconstruction technique. CONTRAST:  135m OMNIPAQUE IOHEXOL 300 MG/ML  SOLN COMPARISON:  05/07/2021 FINDINGS: Lower chest: Scarring is noted in the bases bilaterally stable from the prior exam. Hepatobiliary: Fatty infiltration of the liver is noted. The gallbladder is within normal limits. Pancreas: Pancreas demonstrates diffuse peripancreatic inflammatory change consistent with acute pancreatitis. No pseudocyst or significant phlegmon is noted. Spleen: Normal in size without focal abnormality. Adrenals/Urinary Tract: The  bladder is well distended. Stomach/Bowel: Diverticular change of the colon is noted without evidence of diverticulitis. The appendix is well visualized and within normal limits. No inflammatory changes are seen. The small bowel and stomach are within normal limits. Vascular/Lymphatic: Aortic atherosclerosis. No enlarged abdominal or pelvic lymph nodes. Reproductive: Prostate is unremarkable. Other: No abdominal wall hernia or abnormality. No abdominopelvic ascites. Musculoskeletal: No acute or significant osseous findings. IMPRESSION: Changes consistent with acute pancreatitis without pseudocyst formation. Fatty liver. Diverticulosis without diverticulitis. Electronically Signed   By: MInez CatalinaM.D.   On: 01/24/2022 19:43        Scheduled Meds:  heparin  5,000 Units Subcutaneous Q8H   insulin aspart  0-6 Units Subcutaneous Q4H   Continuous Infusions:  sodium chloride            KAline August MD [facility] 01/26/2022, 7:23 AM

## 2022-01-27 ENCOUNTER — Telehealth: Payer: Self-pay | Admitting: Nurse Practitioner

## 2022-01-27 ENCOUNTER — Encounter: Payer: Self-pay | Admitting: Nurse Practitioner

## 2022-01-27 DIAGNOSIS — K8500 Idiopathic acute pancreatitis without necrosis or infection: Secondary | ICD-10-CM | POA: Diagnosis not present

## 2022-01-27 LAB — CBC WITH DIFFERENTIAL/PLATELET
Abs Immature Granulocytes: 0.08 10*3/uL — ABNORMAL HIGH (ref 0.00–0.07)
Basophils Absolute: 0.1 10*3/uL (ref 0.0–0.1)
Basophils Relative: 1 %
Eosinophils Absolute: 0.3 10*3/uL (ref 0.0–0.5)
Eosinophils Relative: 3 %
HCT: 41.2 % (ref 39.0–52.0)
Hemoglobin: 13.1 g/dL (ref 13.0–17.0)
Immature Granulocytes: 1 %
Lymphocytes Relative: 22 %
Lymphs Abs: 2.4 10*3/uL (ref 0.7–4.0)
MCH: 30.6 pg (ref 26.0–34.0)
MCHC: 31.8 g/dL (ref 30.0–36.0)
MCV: 96.3 fL (ref 80.0–100.0)
Monocytes Absolute: 0.9 10*3/uL (ref 0.1–1.0)
Monocytes Relative: 8 %
Neutro Abs: 7.3 10*3/uL (ref 1.7–7.7)
Neutrophils Relative %: 65 %
Platelets: 272 10*3/uL (ref 150–400)
RBC: 4.28 MIL/uL (ref 4.22–5.81)
RDW: 12.4 % (ref 11.5–15.5)
WBC: 11 10*3/uL — ABNORMAL HIGH (ref 4.0–10.5)
nRBC: 0 % (ref 0.0–0.2)

## 2022-01-27 LAB — COMPREHENSIVE METABOLIC PANEL
ALT: 16 U/L (ref 0–44)
AST: 13 U/L — ABNORMAL LOW (ref 15–41)
Albumin: 3.3 g/dL — ABNORMAL LOW (ref 3.5–5.0)
Alkaline Phosphatase: 45 U/L (ref 38–126)
Anion gap: 6 (ref 5–15)
BUN: 7 mg/dL (ref 6–20)
CO2: 24 mmol/L (ref 22–32)
Calcium: 8.5 mg/dL — ABNORMAL LOW (ref 8.9–10.3)
Chloride: 107 mmol/L (ref 98–111)
Creatinine, Ser: 0.83 mg/dL (ref 0.61–1.24)
GFR, Estimated: >60 mL/min (ref >60)
Glucose, Bld: 136 mg/dL — ABNORMAL HIGH (ref 70–99)
Potassium: 3.4 mmol/L — ABNORMAL LOW (ref 3.5–5.1)
Sodium: 137 mmol/L (ref 135–145)
Total Bilirubin: 0.6 mg/dL (ref 0.3–1.2)
Total Protein: 7 g/dL (ref 6.5–8.1)

## 2022-01-27 LAB — GLUCOSE, CAPILLARY
Glucose-Capillary: 112 mg/dL — ABNORMAL HIGH (ref 70–99)
Glucose-Capillary: 116 mg/dL — ABNORMAL HIGH (ref 70–99)
Glucose-Capillary: 117 mg/dL — ABNORMAL HIGH (ref 70–99)
Glucose-Capillary: 125 mg/dL — ABNORMAL HIGH (ref 70–99)
Glucose-Capillary: 141 mg/dL — ABNORMAL HIGH (ref 70–99)
Glucose-Capillary: 143 mg/dL — ABNORMAL HIGH (ref 70–99)

## 2022-01-27 LAB — LIPASE, BLOOD: Lipase: 26 U/L (ref 11–51)

## 2022-01-27 LAB — MAGNESIUM: Magnesium: 2.1 mg/dL (ref 1.7–2.4)

## 2022-01-27 MED ORDER — PANCRELIPASE (LIP-PROT-AMYL) 12000-38000 UNITS PO CPEP
12000.0000 [IU] | ORAL_CAPSULE | Freq: Three times a day (TID) | ORAL | Status: DC
  Administered 2022-01-27 – 2022-01-28 (×2): 12000 [IU] via ORAL
  Filled 2022-01-27 (×2): qty 1

## 2022-01-27 MED ORDER — POTASSIUM CHLORIDE CRYS ER 20 MEQ PO TBCR
40.0000 meq | EXTENDED_RELEASE_TABLET | Freq: Once | ORAL | Status: AC
  Administered 2022-01-27: 40 meq via ORAL
  Filled 2022-01-27: qty 2

## 2022-01-27 MED ORDER — POLYETHYLENE GLYCOL 3350 17 G PO PACK
17.0000 g | PACK | Freq: Every day | ORAL | Status: DC
Start: 2022-01-27 — End: 2022-01-28
  Administered 2022-01-27: 17 g via ORAL
  Filled 2022-01-27: qty 1

## 2022-01-27 MED ORDER — BISACODYL 10 MG RE SUPP
10.0000 mg | Freq: Every evening | RECTAL | Status: DC | PRN
Start: 2022-01-27 — End: 2022-01-28

## 2022-01-27 NOTE — TOC Progression Note (Signed)
Transition of Care [facility]) - Progression Note    Patient Details  Name: CARMINE CARROZZA MRN: [id number] Date of Birth: 07-05-1975  [facility] CM/SW Contact  Purcell Mouton, RN Phone Number: 01/27/2022, 3:19 PM  Clinical Narrative:    Pt from home with spouse. At present time there are no needs.   Expected Discharge Plan: Home/Self Care Barriers to Discharge: No Barriers Identified  Expected Discharge Plan and Services Expected Discharge Plan: Home/Self Care       Living arrangements for the past 2 months: Single Family Home                                       Social Determinants of Health (SDOH) Interventions    Readmission Risk Interventions     No data to display

## 2022-01-27 NOTE — Progress Notes (Addendum)
[facility] Progress Note  CC:  Acute pancreatitis    Subjective: He slept well last night.  His abdominal pain has decreased.  He went almost 12 hours overnight without needing Percocet.  He is passing gas per the rectum, no BM x 4 days.  He is tolerating a clear liquid diet.  No nausea or vomiting.  No chest pain or shortness of breath.  Wife at the bedside.  Objective:  Vital signs in last 24 hours: Temp:  [98 F (36.7 C)-98.7 F (37.1 C)] 98 F (36.7 C) (08/02 0400) Pulse Rate:  [87-93] 87 (08/02 0400) Resp:  [18] 18 (08/02 0400) BP: (117-141)/(88-95) 141/95 (08/02 0400) SpO2:  [96 %-98 %] 98 % (08/02 0400) Weight:  [91.7 kg] 91.7 kg (08/02 0500) Last BM Date : 12/23/21  General: Alert 46 year old male in no acute distress Heart: Regular rate and rhythm, no murmurs Pulm: Breath sounds clear throughout. Abdomen: Soft, abdomen is mildly tight but not tense.  Nontender.  Positive bowel sounds to all 4 quadrants. Extremities:  Without edema. Neurologic:  Alert and  oriented x 4. Grossly normal neurologically. Psych:  Alert and cooperative. Normal mood and affect.  Intake/Output from previous day: 08/01 0701 - 08/02 0700 In: 3118.2 [P.O.:840; I.V.:2278.2] Out: 2450 [Urine:2450] Intake/Output this shift: No intake/output data recorded.  Lab Results: Recent Labs    01/25/22 0158 01/26/22 0405 01/27/22 0347  WBC 16.1* 14.8* 11.0*  HGB 13.5 13.7 13.1  HCT 38.3* 40.0 41.2  PLT 258 219 272   BMET Recent Labs    01/25/22 0158 01/26/22 0405 01/27/22 0347  NA 133* 136 137  K 3.8 4.0 3.4*  CL 100 103 107  CO2 _0 GLUCOSE 141* 137* 136*  BUN _1 CREATININE 0.68 0.71 0.83  CALCIUM 8.8* 9.1 8.5*   LFT Recent Labs    01/25/22 0158 01/26/22 0405 01/27/22 0347  PROT 6.8   < > 7.0  ALBUMIN 3.6   < > 3.3*  AST 13*   < > 13*  ALT 19   < > 16  ALKPHOS 48   < > 45  BILITOT 1.7*   < > 0.6  BILIDIR 0.3*  --   --   IBILI 1.4*  --   --     < > = values in this interval not displayed.   PT/INR No results for input(s): "LABPROT", "INR" in the last 72 hours. Hepatitis Panel No results for input(s): "HEPBSAG", "HCVAB", "HEPAIGM", "HEPBIGM" in the last 72 hours.  No results found.  Assessment / Plan:  9) 46 year old male with recurrent acute pancreatitis, etiology unknown.  No history of alcohol use disorder.  Normal IgG4 level 06/2021.  ANA negative. Triglyceride level 74.  Lipase 387 -> 184 -> 26.  Total bili 0.6.  Alk phos 45.  AST 13.  ALT 16.  CTAP with contrast 01/24/2022 identified diffuse peripancreatic inflammatory changes consistent with acute pancreatitis without evidence of pseudocyst.  Abdominal MRI/MRCP 08/2021 identified pancreatic atrophy with a few small cystic foci scattered throughout the pancreas measuring up to 7 mm in the tail anteriorly with a normal pancreatic duct.  Abdominal pain decreasing. -Full liquid diet -IV fluids and pain management per the hospitalist -Consider eventual  repeat abdominal MRI/MRCP vs EUS to further evaluate the pancreas and ampulla, to discuss further with Dr. Edison Nasuti. Our office will contact patient to schedule a follow-up appointment. -CFTR, PRSS1, SPINK1 (family history of pancreatitis/kidney disease) in process (miscellaneous/send  out labs) -If genetic work up negative,  patient may need to be reconsidered for prophylactic cholecystectomy   2) GERD -Pantoprazole 40 mg p.o. daily   3) Leukocytosis, likely from acute pancreatitis.  WBC 16.1 -> 14.8 -> 11. He remains afebrile.   4) DM II  5) Constipation.  No BM in 4 days in setting of reduced p.o. intake. -MiraLAX at bed time tonight then QHS PRN -Dulcolax suppository x 1 tonight  Await further recommendations per Dr. Candis Schatz  Principal Problem:   Acute pancreatitis Active Problems:   Diabetes ([place])   Leukocytosis   Hyponatremia   Obesity (BMI 30-39.9)     LOS: 3 days   Noralyn Pick  01/27/2022, 9:21  AM  ----------------------------------------------------------------------------------  I have taken a history, reviewed the chart and examined the patient. I performed a substantive portion of this encounter, including complete performance of at least one of the key components, in conjunction with the APP. I agree with the APP's note, impression and recommendations  Patient continues to show clinical improvement, pain improved, tolerating full liquid diet, no complications thus far. GI will sign off for now.  Plan for outpatient follow up to consider EUS or repeat referral to Gen Surg We can provide him with a letter for his cruise explaining he had a medical event which may prevent him from going on his vacation.  Armine Rizzolo E. Candis Schatz, MD [facility]

## 2022-01-27 NOTE — Telephone Encounter (Signed)
03/09/22 at 1010 am appt made with Dr Ardis Hughs.  Jaclyn Shaggy will you add this to the pt discharge summary?

## 2022-01-27 NOTE — Progress Notes (Signed)
PROGRESS NOTE    Dean Glass  JHE:[id number] DOB: [birth year] DOA: 01/24/2022 PCP: Cassandria Anger, MD   Brief Narrative:  46 year old male with history of presented with worsening abdominal pain.  On presentation, CT of abdomen and pelvis showed features consistent with acute pancreatitis with no cirrhosis.  .  GI was consulted.  Assessment & Plan:   Acute recurrent pancreatitis -Questionable cause. -lipase and lft improved, WBC improving. -Continue IV fluids, IV analgesics and antiemetics as needed.  Abdominal pain improving , diet per gi -management per GI   Diabetes mellitus type 2 , on metformin at home. -stable , Continue CBGs with SSI  Hyponatremia -Resolved.  Monitor.  Hypokalemia, replace K, repeat in the morning  Obesity -outpatient follow-up    DVT prophylaxis: Heparin subcutaneous Code Status: Full Family Communication: Patient Disposition Plan: Home once cleared by GI     Consultants: GI  Procedures: None  Antimicrobials: None   Subjective:  Improving.  No acute overnight event  Diet advanced to full liquid today per GI  He preferred to stay on IV fluids for another day  Objective: Vitals:   01/26/22 2119 01/27/22 0400 01/27/22 0500 01/27/22 1341  BP: 117/88 (!) 141/95  128/79  Pulse: 88 87  85  Resp: '18 18  18  '$ Temp: 98.7 F (37.1 C) 98 F (36.7 C)  98.4 F (36.9 C)  TempSrc: Oral Oral  Oral  SpO2: 96% 98%  97%  Weight:   91.7 kg   Height:        Intake/Output Summary (Last 24 hours) at 01/27/2022 1751 Last data filed at 01/27/2022 1731 Gross per 24 hour  Intake 4176.42 ml  Output 3500 ml  Net 676.42 ml   Filed Weights   01/24/22 1631 01/27/22 0500  Weight: 95.3 kg 91.7 kg    Examination:  General: No acute distress, currently on room air ENT/neck: No elevated JVD.  No obvious masses  respiratory: Bilateral decreased breath sounds at bases with some scattered crackles CVS: S1-S2 heard, still tachycardic   abdominal: Soft, tender in the epigastric and periumbilical regions, distended slightly, no organomegaly, bowel sounds heard Extremities: No cyanosis, clubbing, edema CNS: Alert, awake and oriented.  No focal neurologic deficit.  Moving extremities. Lymph: No cervical lymphadenopathy Skin: No rashes, lesions, ulcers Psych: Normal mood, affect and judgment Musculoskeletal: No obvious joint deformity/tenderness/swelling    Data Reviewed: I have personally reviewed following labs and imaging studies  CBC: Recent Labs  Lab 01/24/22 0109 01/24/22 1846 01/25/22 0158 01/26/22 0405 01/27/22 0347  WBC 14.8* 15.7* 16.1* 14.8* 11.0*  NEUTROABS  --   --  12.9* 10.8* 7.3  HGB 15.2 14.9 13.5 13.7 13.1  HCT 43.5 42.6 38.3* 40.0 41.2  MCV 87.9 88.9 88.9 90.5 96.3  PLT 306 277 258 219 185   Basic Metabolic Panel: Recent Labs  Lab 01/24/22 0109 01/24/22 1846 01/25/22 0158 01/26/22 0405 01/27/22 0347  NA 134* 135 133* 136 137  K 3.8 4.0 3.8 4.0 3.4*  CL 102 100 100 103 107  CO2 '23 24 25 27 24  '$ GLUCOSE 166* 151* 141* 137* 136*  BUN '11 10 8 7 7  '$ CREATININE 0.89 0.83 0.68 0.71 0.83  CALCIUM 9.6 9.4 8.8* 9.1 8.5*  MG  --   --   --  2.1 2.1   GFR: Estimated Creatinine Clearance: 120 mL/min (by C-G formula based on SCr of 0.83 mg/dL). Liver Function Tests: Recent Labs  Lab 01/24/22 0109 01/24/22 1846 01/25/22 0158 01/26/22  0405 01/27/22 0347  AST 18 15 13* 13* 13*  ALT '25 24 19 18 16  '$ ALKPHOS 61 54 48 51 45  BILITOT 1.4* 1.6* 1.7* 1.2 0.6  PROT 8.0 8.0 6.8 7.2 7.0  ALBUMIN 4.5 4.4 3.6 3.5 3.3*   Recent Labs  Lab 01/24/22 0109 01/24/22 1846 01/27/22 0347  LIPASE 397* 184* 26   No results for input(s): "AMMONIA" in the last 168 hours. Coagulation Profile: No results for input(s): "INR", "PROTIME" in the last 168 hours. Cardiac Enzymes: No results for input(s): "CKTOTAL", "CKMB", "CKMBINDEX", "TROPONINI" in the last 168 hours. BNP (last 3 results) No results for  input(s): "PROBNP" in the last 8760 hours. HbA1C: No results for input(s): "HGBA1C" in the last 72 hours. CBG: Recent Labs  Lab 01/26/22 2344 01/27/22 0400 01/27/22 0740 01/27/22 1132 01/27/22 1620  GLUCAP 163* 143* 125* 117* 116*   Lipid Profile: Recent Labs    01/25/22 0158  TRIG 74   Thyroid Function Tests: No results for input(s): "TSH", "T4TOTAL", "FREET4", "T3FREE", "THYROIDAB" in the last 72 hours. Anemia Panel: No results for input(s): "VITAMINB12", "FOLATE", "FERRITIN", "TIBC", "IRON", "RETICCTPCT" in the last 72 hours. Sepsis Labs: No results for input(s): "PROCALCITON", "LATICACIDVEN" in the last 168 hours.  No results found for this or any previous visit (from the past 240 hour(s)).       Radiology Studies: No results found.      Scheduled Meds:  heparin  5,000 Units Subcutaneous Q8H   insulin aspart  0-6 Units Subcutaneous Q4H   lipase/protease/amylase  12,000 Units Oral TID WC   polyethylene glycol  17 g Oral QHS   Continuous Infusions:  sodium chloride 125 mL/hr at 01/27/22 1335          Florencia Reasons, MD PhD FACP [facility] 01/27/2022, 5:51 PM

## 2022-01-27 NOTE — Telephone Encounter (Signed)
Dean Glass, Dean Glass is currently at [facility] with recurrent pancreatitis.  Hopefully he will be discharged home in a next few days.  Please contact him and schedule him for a follow-up appointment with Dr. Ardis Hughs in 3 to 4 weeks thank you.

## 2022-01-28 ENCOUNTER — Encounter: Payer: Self-pay | Admitting: Physician Assistant

## 2022-01-28 ENCOUNTER — Other Ambulatory Visit (HOSPITAL_COMMUNITY): Payer: Self-pay

## 2022-01-28 DIAGNOSIS — E871 Hypo-osmolality and hyponatremia: Secondary | ICD-10-CM | POA: Diagnosis not present

## 2022-01-28 DIAGNOSIS — E119 Type 2 diabetes mellitus without complications: Secondary | ICD-10-CM | POA: Diagnosis not present

## 2022-01-28 DIAGNOSIS — K859 Acute pancreatitis without necrosis or infection, unspecified: Secondary | ICD-10-CM | POA: Diagnosis not present

## 2022-01-28 DIAGNOSIS — E669 Obesity, unspecified: Secondary | ICD-10-CM | POA: Diagnosis not present

## 2022-01-28 LAB — BASIC METABOLIC PANEL
Anion gap: 7 (ref 5–15)
BUN: 9 mg/dL (ref 6–20)
CO2: 22 mmol/L (ref 22–32)
Calcium: 8.5 mg/dL — ABNORMAL LOW (ref 8.9–10.3)
Chloride: 109 mmol/L (ref 98–111)
Creatinine, Ser: 0.82 mg/dL (ref 0.61–1.24)
GFR, Estimated: >60 mL/min (ref >60)
Glucose, Bld: 133 mg/dL — ABNORMAL HIGH (ref 70–99)
Potassium: 4.3 mmol/L (ref 3.5–5.1)
Sodium: 138 mmol/L (ref 135–145)

## 2022-01-28 LAB — GLUCOSE, CAPILLARY
Glucose-Capillary: 139 mg/dL — ABNORMAL HIGH (ref 70–99)
Glucose-Capillary: 137 mg/dL — ABNORMAL HIGH (ref 70–99)

## 2022-01-28 MED ORDER — METFORMIN HCL ER 500 MG PO TB24: 500.0000 mg | ORAL_TABLET | Freq: Every day | ORAL | 3 refills | Status: DC

## 2022-01-28 MED ORDER — PANCRELIPASE (LIP-PROT-AMYL) 12000-38000 UNITS PO CPEP
12000.0000 [IU] | ORAL_CAPSULE | Freq: Three times a day (TID) | ORAL | 0 refills | Status: DC
  Filled 2022-01-28: qty 21, 7d supply, fill #0
  Filled 2022-01-28: qty 69, 23d supply, fill #0

## 2022-01-28 MED ORDER — METFORMIN HCL 500 MG PO TABS: 500.0000 mg | ORAL_TABLET | Freq: Every morning | ORAL | Status: DC

## 2022-01-28 MED ORDER — OXYCODONE HCL 5 MG PO TABS
5.0000 mg | ORAL_TABLET | ORAL | 0 refills | Status: DC | PRN
  Filled 2022-01-28: qty 18, 3d supply, fill #0

## 2022-01-28 NOTE — Progress Notes (Signed)
Discharge instructions given to patient and all questions were answered.  

## 2022-01-28 NOTE — Progress Notes (Signed)
Patient has been in the hospital for recurrent pancreatitis, being discharged home however this is a long slow process for healing.  Patient has travel plans within the next 2 weeks, if patient continues to have abdominal pain, nausea vomiting, patient is advised not to travel.  Can have sequelae or complications from his recurrent pancreatitis that would make it advisable not to travel. Letter sent via mychart.  If he needs it signed, can print out and pick it up from the office.

## 2022-01-28 NOTE — Discharge Summary (Signed)
Discharge Summary  Dean Glass ZGY:[id number] DOB: 06-Sep-1975  PCP: Cassandria Anger, MD  Admit date: 01/24/2022 Discharge date: 01/28/2022    Time spent:  41mns  Recommendations for Outpatient Follow-up:  F/u with PCP within a week  for hospital discharge follow up, repeat cbc/cmp at follow up F/u with GI as scheduled , GI to follow up on pending lab result   Unresulted Labs (From admission, onward)     Start     Ordered   01/27/22 0547  Miscellaneous [facility] test (send-out)  Once,   R       Question:  Test name / description:  Answer:  PRSS1   01/27/22 0547            Discharge Diagnoses:  Active Hospital Problems   Diagnosis Date Noted   Acute pancreatitis 05/11/2021   Leukocytosis 01/25/2022   Hyponatremia 01/25/2022   Obesity (BMI 30-39.9) 01/25/2022   Diabetes ([name] 05/11/2021    Resolved Hospital Problems  No resolved problems to display.    Discharge Condition: stable  Diet recommendation: heart healthy/carb modified  Filed Weights   01/24/22 1631 01/27/22 0500 01/28/22 0417  Weight: 95.3 kg 91.7 kg 91.4 kg    History of present illness: (Per admitting MD Dr. [name] Chief Complaint: Abdominal pain.   HPI: Dean WAHLSTROMis a 46y.o. male with history of pancreatitis cause not clear follows with [facility] he has been having worsening abdominal pain for the last 3 days.  Pain is seen in the epigastrium radiating to the back with an episode of nausea vomiting.  Had gone to the med Center ER was treated with pain medication and discharged but comes back with worsening pain.   ED Course: In the ER patient had CT abdomen pelvis which shows features consistent with acute pancreatitis with no cirrhosis.  Lipase is elevated.  Patient admitted for further management of acute pancreatitis.  Patient is tachycardic.  Hospital Course:  Principal Problem:   Acute pancreatitis Active Problems:   Diabetes ([place]   Leukocytosis    Hyponatremia   Obesity (BMI 30-39.9)   Assessment and Plan:  Acute recurrent pancreatitis -Questionable cause. -Received IV fluids IV analgesic and antiemetics -lipase and lft improved, WBC improving. - Abdominal pain improving , has not used any analgesics for the last 12 hours ,diet advancement per gi -GI cleared him to go home with outpatient gi follow up   Diabetes mellitus type 2 , on metformin at home. -hold metformin in the setting of recurrent pancreatitis (report were started on metformin October last year, started to have pancreatitis November last year) -stable , he did not need any ssi insulin in the hospital -he plan to do diet control, f/u with pcp   Hyponatremia -Resolved.    Hypokalemia, replace and normalized   Obesity Body mass index is 31.56 kg/m. -outpatient follow-up   Discharge Exam: BP 131/81 (BP Location: Right Arm)   Pulse 80   Temp 97.7 F (36.5 C) (Oral)   Resp 18   Ht 5' 7"  (1.702 m)   Wt 91.4 kg   SpO2 98%   BMI 31.56 kg/m   General: NAD Cardiovascular: RRR Respiratory: Normal respiratory effort    Discharge Instructions     Diet - low sodium heart healthy   Complete by: As directed    Carb modified diet, low fat diet   Increase activity slowly   Complete by: As directed       Allergies as of  01/28/2022       Reactions   Atorvastatin    Possible Pancreatitis        Medication List     TAKE these medications    aspirin EC 81 MG tablet Take 81 mg by mouth daily. Swallow whole.   Creon 12000-38000 units Cpep capsule Generic drug: lipase/protease/amylase Take 1 capsule (12,000 Units total) by mouth 3 (three) times daily with meals.   metFORMIN 500 MG 24 hr tablet Commonly known as: GLUCOPHAGE-XR Take 1 tablet (500 mg total) by mouth daily with breakfast. Hold this medication, please discuss with your GI doctor regarding resuming What changed: additional instructions   metFORMIN 500 MG tablet Commonly known as:  GLUCOPHAGE Take 1 tablet (500 mg total) by mouth every morning. hold What changed: additional instructions   ondansetron 4 MG disintegrating tablet Commonly known as: ZOFRAN-ODT Take 1 tablet (4 mg total) by mouth every 8 (eight) hours as needed for nausea or vomiting.   onetouch ultrasoft lancets Use as instructed   OneTouch Verio test strip Generic drug: glucose blood 1 each by Other route daily.   OneTouch Verio w/Device Kit 1 Units by Does not apply route daily as needed.   oxyCODONE 5 MG immediate release tablet Commonly known as: Roxicodone Take 1 tablet (5 mg total) by mouth every 4 (four) hours as needed for severe pain.   Vitamin D3 50 MCG (2000 UT) capsule Take 1 capsule (2,000 Units total) by mouth daily.   zinc gluconate 50 MG tablet Take 50 mg by mouth daily.       Allergies  Allergen Reactions   Atorvastatin     Possible Pancreatitis    Follow-up Information     Milus Banister, MD. Call.   Specialty: Gastroenterology Why: Follow up appointment with Dr. Ardis Hughs scheduled 03/09/2022 at 10:10am Contact information: [address] [place] [address] [phone]         Plotnikov, Evie Lacks, MD Follow up in 1 week(s).   Specialty: Internal Medicine Why: hospital discharge follow up, repeat basic lab works at follow up please work with your pcp regarding diabetes control Contact information: [place] [address] [phone]                  The results of significant diagnostics from this hospitalization (including imaging, microbiology, ancillary and laboratory) are listed below for reference.    Significant Diagnostic Studies: CT ABDOMEN PELVIS W CONTRAST  Result Date: 01/24/2022 CLINICAL DATA:  Abdominal pain, possible pancreatitis EXAM: CT ABDOMEN AND PELVIS WITH CONTRAST TECHNIQUE: Multidetector CT imaging of the abdomen and pelvis was performed using the standard protocol following bolus administration  of intravenous contrast. RADIATION DOSE REDUCTION: This exam was performed according to the departmental dose-optimization program which includes automated exposure control, adjustment of the mA and/or kV according to patient size and/or use of iterative reconstruction technique. CONTRAST:  135m OMNIPAQUE IOHEXOL 300 MG/ML  SOLN COMPARISON:  05/07/2021 FINDINGS: Lower chest: Scarring is noted in the bases bilaterally stable from the prior exam. Hepatobiliary: Fatty infiltration of the liver is noted. The gallbladder is within normal limits. Pancreas: Pancreas demonstrates diffuse peripancreatic inflammatory change consistent with acute pancreatitis. No pseudocyst or significant phlegmon is noted. Spleen: Normal in size without focal abnormality. Adrenals/Urinary Tract: The bladder is well distended. Stomach/Bowel: Diverticular change of the colon is noted without evidence of diverticulitis. The appendix is well visualized and within normal limits. No inflammatory changes are seen. The small bowel and stomach  are within normal limits. Vascular/Lymphatic: Aortic atherosclerosis. No enlarged abdominal or pelvic lymph nodes. Reproductive: Prostate is unremarkable. Other: No abdominal wall hernia or abnormality. No abdominopelvic ascites. Musculoskeletal: No acute or significant osseous findings. IMPRESSION: Changes consistent with acute pancreatitis without pseudocyst formation. Fatty liver. Diverticulosis without diverticulitis. Electronically Signed   By: Inez Catalina M.D.   On: 01/24/2022 19:43    Microbiology: No results found for this or any previous visit (from the past 240 hour(s)).   Labs: Basic Metabolic Panel: Recent Labs  Lab 01/24/22 1846 01/25/22 0158 01/26/22 0405 01/27/22 0347 01/28/22 0401  NA 135 133* 136 137 138  K 4.0 3.8 4.0 3.4* 4.3  CL 100 100 103 107 109  CO2 24 25 27 24 22   GLUCOSE 151* 141* 137* 136* 133*  BUN 10 8 7 7 9   CREATININE 0.83 0.68 0.71 0.83 0.82  CALCIUM 9.4  8.8* 9.1 8.5* 8.5*  MG  --   --  2.1 2.1  --    Liver Function Tests: Recent Labs  Lab 01/24/22 0109 01/24/22 1846 01/25/22 0158 01/26/22 0405 01/27/22 0347  AST 18 15 13* 13* 13*  ALT 25 24 19 18 16   ALKPHOS 61 54 48 51 45  BILITOT 1.4* 1.6* 1.7* 1.2 0.6  PROT 8.0 8.0 6.8 7.2 7.0  ALBUMIN 4.5 4.4 3.6 3.5 3.3*   Recent Labs  Lab 01/24/22 0109 01/24/22 1846 01/27/22 0347  LIPASE 397* 184* 26   No results for input(s): "AMMONIA" in the last 168 hours. CBC: Recent Labs  Lab 01/24/22 0109 01/24/22 1846 01/25/22 0158 01/26/22 0405 01/27/22 0347  WBC 14.8* 15.7* 16.1* 14.8* 11.0*  NEUTROABS  --   --  12.9* 10.8* 7.3  HGB 15.2 14.9 13.5 13.7 13.1  HCT 43.5 42.6 38.3* 40.0 41.2  MCV 87.9 88.9 88.9 90.5 96.3  PLT 306 277 258 219 272   Cardiac Enzymes: No results for input(s): "CKTOTAL", "CKMB", "CKMBINDEX", "TROPONINI" in the last 168 hours. BNP: BNP (last 3 results) No results for input(s): "BNP" in the last 8760 hours.  ProBNP (last 3 results) No results for input(s): "PROBNP" in the last 8760 hours.  CBG: Recent Labs  Lab 01/27/22 1620 01/27/22 2000 01/27/22 2349 01/28/22 0414 01/28/22 0724  GLUCAP 116* 141* 112* 139* 137*    FURTHER DISCHARGE INSTRUCTIONS:   Get Medicines reviewed and adjusted: Please take all your medications with you for your next visit with your Primary MD   Laboratory/radiological data: Please request your Primary MD to go over all hospital tests and procedure/radiological results at the follow up, please ask your Primary MD to get all Hospital records sent to his/her office.   In some cases, they will be blood work, cultures and biopsy results pending at the time of your discharge. Please request that your primary care M.D. goes through all the records of your hospital data and follows up on these results.   Also Note the following: If you experience worsening of your admission symptoms, develop shortness of breath, life  threatening emergency, suicidal or homicidal thoughts you must seek medical attention immediately by calling 911 or calling your MD immediately  if symptoms less severe.   You must read complete instructions/literature along with all the possible adverse reactions/side effects for all the Medicines you take and that have been prescribed to you. Take any new Medicines after you have completely understood and accpet all the possible adverse reactions/side effects.    Do not drive when taking Pain medications or  sleeping medications (Benzodaizepines)   Do not take more than prescribed Pain, Sleep and Anxiety Medications. It is not advisable to combine anxiety,sleep and pain medications without talking with your primary care practitioner   Special Instructions: If you have smoked or chewed Tobacco  in the last 2 yrs please stop smoking, stop any regular Alcohol  and or any Recreational drug use.   Wear Seat belts while driving.   Please note: You were cared for by a hospitalist during your hospital stay. Once you are discharged, your primary care physician will handle any further medical issues. Please note that NO REFILLS for any discharge medications will be authorized once you are discharged, as it is imperative that you return to your primary care physician (or establish a relationship with a primary care physician if you do not have one) for your post hospital discharge needs so that they can reassess your need for medications and monitor your lab values.     Signed:  Florencia Reasons MD, PhD, FACP  [facility] 01/28/2022, 3:10 PM

## 2022-01-29 ENCOUNTER — Telehealth: Payer: Self-pay

## 2022-01-29 ENCOUNTER — Other Ambulatory Visit (HOSPITAL_COMMUNITY): Payer: Self-pay

## 2022-01-29 NOTE — Telephone Encounter (Signed)
Transition Care Management Unsuccessful Follow-up Telephone Call  Date of discharge and from where:  [facility] 01/28/2022  Attempts:  1st Attempt  Reason for unsuccessful TCM follow-up call:  No answer/busy

## 2022-02-01 ENCOUNTER — Ambulatory Visit (INDEPENDENT_AMBULATORY_CARE_PROVIDER_SITE_OTHER): Payer: 59 | Admitting: Internal Medicine

## 2022-02-01 ENCOUNTER — Encounter: Payer: Self-pay | Admitting: Internal Medicine

## 2022-02-01 DIAGNOSIS — E669 Obesity, unspecified: Secondary | ICD-10-CM

## 2022-02-01 DIAGNOSIS — K8500 Idiopathic acute pancreatitis without necrosis or infection: Secondary | ICD-10-CM | POA: Diagnosis not present

## 2022-02-01 DIAGNOSIS — E1169 Type 2 diabetes mellitus with other specified complication: Secondary | ICD-10-CM | POA: Diagnosis not present

## 2022-02-01 DIAGNOSIS — I2510 Atherosclerotic heart disease of native coronary artery without angina pectoris: Secondary | ICD-10-CM

## 2022-02-01 DIAGNOSIS — E785 Hyperlipidemia, unspecified: Secondary | ICD-10-CM | POA: Diagnosis not present

## 2022-02-01 DIAGNOSIS — I2583 Coronary atherosclerosis due to lipid rich plaque: Secondary | ICD-10-CM

## 2022-02-01 LAB — CBC WITH DIFFERENTIAL/PLATELET
Basophils Absolute: 0 10*3/uL (ref 0.0–0.1)
Basophils Relative: 0.6 % (ref 0.0–3.0)
Eosinophils Absolute: 0.2 10*3/uL (ref 0.0–0.7)
Eosinophils Relative: 2 % (ref 0.0–5.0)
HCT: 42.7 % (ref 39.0–52.0)
Hemoglobin: 14.7 g/dL (ref 13.0–17.0)
Lymphocytes Relative: 36.9 % (ref 12.0–46.0)
Lymphs Abs: 3.2 10*3/uL (ref 0.7–4.0)
MCHC: 34.3 g/dL (ref 30.0–36.0)
MCV: 89.2 fl (ref 78.0–100.0)
Monocytes Absolute: 0.6 10*3/uL (ref 0.1–1.0)
Monocytes Relative: 7.2 % (ref 3.0–12.0)
Neutro Abs: 4.6 10*3/uL (ref 1.4–7.7)
Neutrophils Relative %: 53.3 % (ref 43.0–77.0)
Platelets: 375 10*3/uL (ref 150.0–400.0)
RBC: 4.79 Mil/uL (ref 4.22–5.81)
RDW: 13 % (ref 11.5–15.5)
WBC: 8.7 10*3/uL (ref 4.0–10.5)

## 2022-02-01 LAB — COMPREHENSIVE METABOLIC PANEL
ALT: 49 U/L (ref 0–53)
AST: 23 U/L (ref 0–37)
Albumin: 4.3 g/dL (ref 3.5–5.2)
Alkaline Phosphatase: 55 U/L (ref 39–117)
BUN: 13 mg/dL (ref 6–23)
CO2: 26 mEq/L (ref 19–32)
Calcium: 9.6 mg/dL (ref 8.4–10.5)
Chloride: 101 mEq/L (ref 96–112)
Creatinine, Ser: 1.02 mg/dL (ref 0.40–1.50)
GFR: 88.06 mL/min (ref >60.00)
Glucose, Bld: 131 mg/dL — ABNORMAL HIGH (ref 70–99)
Potassium: 4.6 mEq/L (ref 3.5–5.1)
Sodium: 137 mEq/L (ref 135–145)
Total Bilirubin: 0.6 mg/dL (ref 0.2–1.2)
Total Protein: 7.7 g/dL (ref 6.0–8.3)

## 2022-02-01 LAB — LIPASE: Lipase: 17 U/L (ref 11.0–59.0)

## 2022-02-01 NOTE — Assessment & Plan Note (Signed)
Metformin XR - d/c due to pancreatitis Dexcom G7 sample to try

## 2022-02-01 NOTE — Progress Notes (Signed)
Subjective:  Patient ID: Dean Glass, male    DOB: Oct 05, 1975  Age: 46 y.o. MRN: [id number]  CC: Hospitalization Follow-up (Pancreatitis flare)   HPI [name] presents for pancreatitis, DM, CAD  Admit date: 01/24/2022 Discharge date: 01/28/2022       Time spent:  5mns   Recommendations for Outpatient Follow-up:  F/u with PCP within a week  for hospital discharge follow up, repeat cbc/cmp at follow up F/u with GI as scheduled , GI to follow up on pending lab result    Unresulted Labs (From admission, onward)        Start     Ordered    01/27/22 0547   Miscellaneous [facility] test (send-out)  Once,   R       Question:  Test name / description:  Answer:  PRSS1   01/27/22 0547                  Discharge Diagnoses:      Active Hospital Problems    Diagnosis Date Noted   Acute pancreatitis 05/11/2021   Leukocytosis 01/25/2022   Hyponatremia 01/25/2022   Obesity (BMI 30-39.9) 01/25/2022   Diabetes ([place] 05/11/2021     Resolved Hospital Problems  No resolved problems to display.      Discharge Condition: stable   Diet recommendation: heart healthy/carb modified        Filed Weights    01/24/22 1631 01/27/22 0500 01/28/22 0417  Weight: 95.3 kg 91.7 kg 91.4 kg      History of present illness: (Per admitting MD Dr. [name] Chief Complaint: Abdominal pain.   HPI: Dean Glass a 46y.o. male with history of pancreatitis cause not clear follows with [facility] he has been having worsening abdominal pain for the last 3 days.  Pain is seen in the epigastrium radiating to the back with an episode of nausea vomiting.  Had gone to the med Center ER was treated with pain medication and discharged but comes back with worsening pain.   ED Course: In the ER patient had CT abdomen pelvis which shows features consistent with acute pancreatitis with no cirrhosis.  Lipase is elevated.  Patient admitted for further management of acute pancreatitis.   Patient is tachycardic.   Hospital Course:  Principal Problem:   Acute pancreatitis Active Problems:   [place] ([place]   Leukocytosis   Hyponatremia   Obesity (BMI 30-39.9)     Assessment and Plan:   Acute recurrent pancreatitis -Questionable cause. -Received IV fluids IV analgesic and antiemetics -lipase and lft improved, WBC improving. - Abdominal pain improving , has not used any analgesics for the last 12 hours ,diet advancement per gi -GI cleared him to go home with outpatient gi follow up   Diabetes mellitus type 2 , on metformin at home. -hold metformin in the setting of recurrent pancreatitis (report were started on metformin October last year, started to have pancreatitis November last year) -stable , he did not need any ssi insulin in the hospital -he plan to do diet control, f/u with pcp   Hyponatremia -Resolved.    Hypokalemia, replace and normalized   Obesity Body mass index is 31.56 kg/m. -outpatient follow-up     Discharge Exam: BP 131/81 (BP Location: Right Arm)   Pulse 80   Temp 97.7 F (36.5 C) (Oral)   Resp 18   Ht _0  (1.702 m)   Wt 91.4 kg   SpO2 98%   BMI 31.56  kg/m    General: NAD Cardiovascular: RRR Respiratory: Normal respiratory effort       Discharge Instructions       Diet - low sodium heart healthy   Complete by: As directed      Carb modified diet, low fat diet    Increase activity slowly   Complete by: As directed           Outpatient Medications Prior to Visit  Medication Sig Dispense Refill   aspirin EC 81 MG tablet Take 81 mg by mouth daily. Swallow whole.     Blood Glucose Monitoring Suppl (ONETOUCH VERIO) w/Device KIT 1 Units by Does not apply route daily as needed. 1 kit 1   glucose blood (ONETOUCH VERIO) test strip 1 each by Other route daily. 100 strip 3   Lancets (ONETOUCH ULTRASOFT) lancets Use as instructed 100 each 5   lipase/protease/amylase (CREON) 12000-38000 units CPEP capsule Take 1 capsule  (12,000 Units total) by mouth 3 (three) times daily with meals. 90 capsule 0   ondansetron (ZOFRAN-ODT) 4 MG disintegrating tablet Take 1 tablet (4 mg total) by mouth every 8 (eight) hours as needed for nausea or vomiting. 12 tablet 0   oxyCODONE (ROXICODONE) 5 MG immediate release tablet Take 1 tablet (5 mg total) by mouth every 4 (four) hours as needed for severe pain. 18 tablet 0   metFORMIN (GLUCOPHAGE) 500 MG tablet Take 1 tablet (500 mg total) by mouth every morning. hold     metFORMIN (GLUCOPHAGE-XR) 500 MG 24 hr tablet Take 1 tablet (500 mg total) by mouth daily with breakfast. Hold this medication, please discuss with your GI doctor regarding resuming 90 tablet 3   Cholecalciferol (VITAMIN D3) 50 MCG (2000 UT) capsule Take 1 capsule (2,000 Units total) by mouth daily. (Patient not taking: Reported on 09/21/2021) 100 capsule 3   zinc gluconate 50 MG tablet Take 50 mg by mouth daily. (Patient not taking: Reported on 09/21/2021)     No facility-administered medications prior to visit.    ROS: Review of Systems  Constitutional:  Negative for appetite change, fatigue and unexpected weight change.  HENT:  Negative for congestion, nosebleeds, sneezing, sore throat and trouble swallowing.   Eyes:  Negative for itching and visual disturbance.  Respiratory:  Negative for cough.   Cardiovascular:  Negative for chest pain, palpitations and leg swelling.  Gastrointestinal:  Negative for abdominal distention, blood in stool, diarrhea and nausea.  Genitourinary:  Negative for frequency and hematuria.  Musculoskeletal:  Negative for back pain, gait problem, joint swelling and neck pain.  Skin:  Negative for rash.  Neurological:  Negative for dizziness, tremors, speech difficulty and weakness.  Psychiatric/Behavioral:  Negative for agitation, dysphoric mood and sleep disturbance. The patient is not nervous/anxious.     Objective:  BP 100/64 (BP Location: Left Arm, Patient Position: Sitting, Cuff  Size: Normal)   Pulse (!) 107   Temp 98.1 F (36.7 C) (Oral)   Ht _0  (1.702 m)   Wt 197 lb 3.2 oz (89.4 kg)   SpO2 95%   BMI 30.89 kg/m   BP Readings from Last 3 Encounters:  02/01/22 100/64  01/28/22 131/81  01/24/22 (!) 128/93    Wt Readings from Last 3 Encounters:  02/01/22 197 lb 3.2 oz (89.4 kg)  01/28/22 201 lb 8 oz (91.4 kg)  01/24/22 210 lb (95.3 kg)    Physical Exam Constitutional:      General: He is not in acute distress.  Appearance: He is well-developed.     Comments: NAD  Eyes:     Conjunctiva/sclera: Conjunctivae normal.     Pupils: Pupils are equal, round, and reactive to light.  Neck:     Thyroid: No thyromegaly.     Vascular: No JVD.  Cardiovascular:     Rate and Rhythm: Normal rate and regular rhythm.     Heart sounds: Normal heart sounds. No murmur heard.    No friction rub. No gallop.  Pulmonary:     Effort: Pulmonary effort is normal. No respiratory distress.     Breath sounds: Normal breath sounds. No wheezing or rales.  Chest:     Chest wall: No tenderness.  Abdominal:     General: Bowel sounds are normal. There is no distension.     Palpations: Abdomen is soft. There is no mass.     Tenderness: There is no abdominal tenderness. There is no guarding or rebound.  Musculoskeletal:        General: No tenderness. Normal range of motion.     Cervical back: Normal range of motion.  Lymphadenopathy:     Cervical: No cervical adenopathy.  Skin:    General: Skin is warm and dry.     Findings: No rash.  Neurological:     Mental Status: He is alert and oriented to person, place, and time.     Cranial Nerves: No cranial nerve deficit.     Motor: No abnormal muscle tone.     Coordination: Coordination normal.     Gait: Gait normal.     Deep Tendon Reflexes: Reflexes are normal and symmetric.  Psychiatric:        Behavior: Behavior normal.        Thought Content: Thought content normal.        Judgment: Judgment normal.     Lab  Results  Component Value Date   WBC 11.0 (H) 01/27/2022   HGB 13.1 01/27/2022   HCT 41.2 01/27/2022   PLT 272 01/27/2022   GLUCOSE 133 (H) 01/28/2022   CHOL 201 (H) 10/19/2021   TRIG 74 01/25/2022   HDL 39.70 10/19/2021   LDLDIRECT 124.0 10/19/2021   LDLCALC 74 03/31/2021   ALT 16 01/27/2022   AST 13 (L) 01/27/2022   NA 138 01/28/2022   K 4.3 01/28/2022   CL 109 01/28/2022   CREATININE 0.82 01/28/2022   BUN 9 01/28/2022   CO2 22 01/28/2022   TSH 1.91 10/19/2021   PSA 1.11 10/19/2021   HGBA1C 6.8 (H) 10/19/2021    CT ABDOMEN PELVIS W CONTRAST  Result Date: 01/24/2022 CLINICAL DATA:  Abdominal pain, possible pancreatitis EXAM: CT ABDOMEN AND PELVIS WITH CONTRAST TECHNIQUE: Multidetector CT imaging of the abdomen and pelvis was performed using the standard protocol following bolus administration of intravenous contrast. RADIATION DOSE REDUCTION: This exam was performed according to the departmental dose-optimization program which includes automated exposure control, adjustment of the mA and/or kV according to patient size and/or use of iterative reconstruction technique. CONTRAST:  176m OMNIPAQUE IOHEXOL 300 MG/ML  SOLN COMPARISON:  05/07/2021 FINDINGS: Lower chest: Scarring is noted in the bases bilaterally stable from the prior exam. Hepatobiliary: Fatty infiltration of the liver is noted. The gallbladder is within normal limits. Pancreas: Pancreas demonstrates diffuse peripancreatic inflammatory change consistent with acute pancreatitis. No pseudocyst or significant phlegmon is noted. Spleen: Normal in size without focal abnormality. Adrenals/Urinary Tract: The bladder is well distended. Stomach/Bowel: Diverticular change of the colon is noted without evidence of diverticulitis.  The appendix is well visualized and within normal limits. No inflammatory changes are seen. The small bowel and stomach are within normal limits. Vascular/Lymphatic: Aortic atherosclerosis. No enlarged abdominal  or pelvic lymph nodes. Reproductive: Prostate is unremarkable. Other: No abdominal wall hernia or abnormality. No abdominopelvic ascites. Musculoskeletal: No acute or significant osseous findings. IMPRESSION: Changes consistent with acute pancreatitis without pseudocyst formation. Fatty liver. Diverticulosis without diverticulitis. Electronically Signed   By: Inez Catalina M.D.   On: 01/24/2022 19:43    Assessment & Plan:   Problem List Items Addressed This Visit     Acute pancreatitis    F/u Dr Ardis Hughs - D/c Lipitor, Metformin      Relevant Orders   CBC with Differential/Platelet   Lipase   Comprehensive metabolic panel   Coronary atherosclerosis     Metformin XR - d/c due to pancreatitis Dexcom G7 sample to try      Diabetes ([place])     Metformin XR - d/c due to pancreatitis Dexcom G7 sample to try      Relevant Orders   CBC with Differential/Platelet   Lipase   Comprehensive metabolic panel   Dyslipidemia    D/c atorvastatin (Lipitor) due to pancreaitis      Obesity (BMI 30-39.9)    Wt Readings from Last 3 Encounters:  02/01/22 197 lb 3.2 oz (89.4 kg)  01/28/22 201 lb 8 oz (91.4 kg)  01/24/22 210 lb (95.3 kg)           No orders of the defined types were placed in this encounter.     Follow-up: Return in about 3 months (around 05/04/2022) for a follow-up visit.  Walker Kehr, MD

## 2022-02-01 NOTE — Telephone Encounter (Signed)
Dean Glass please see the note from the pt regarding the letter written.

## 2022-02-01 NOTE — Assessment & Plan Note (Signed)
D/c atorvastatin (Lipitor) due to pancreaitis

## 2022-02-01 NOTE — Assessment & Plan Note (Signed)
F/u Dr Ardis Hughs - D/c Lipitor, Metformin

## 2022-02-01 NOTE — Assessment & Plan Note (Signed)
Wt Readings from Last 3 Encounters:  02/01/22 197 lb 3.2 oz (89.4 kg)  01/28/22 201 lb 8 oz (91.4 kg)  01/24/22 210 lb (95.3 kg)

## 2022-02-01 NOTE — Telephone Encounter (Signed)
Transition Care Management Unsuccessful Follow-up Telephone Call  Date of discharge and from where:  01/28/2022  Dean Glass   Attempts:  2nd Attempt  Reason for unsuccessful TCM follow-up call:  No answer/busy

## 2022-02-02 ENCOUNTER — Encounter: Payer: Self-pay | Admitting: Internal Medicine

## 2022-02-03 ENCOUNTER — Other Ambulatory Visit (HOSPITAL_COMMUNITY): Payer: Self-pay

## 2022-02-03 MED ORDER — DEXCOM G7 SENSOR MISC
3 refills | Status: DC
  Filled 2022-02-03: qty 1, 10d supply, fill #0
  Filled 2022-02-11: qty 1, 10d supply, fill #1
  Filled 2022-02-17: qty 1, 10d supply, fill #2
  Filled 2022-02-17: qty 1, 10d supply, fill #0
  Filled 2022-02-17: qty 1, 10d supply, fill #2
  Filled 2022-02-19: qty 2, 20d supply, fill #0

## 2022-02-03 MED ORDER — DEXCOM G7 RECEIVER DEVI
0 refills | Status: AC
Start: 2022-02-03 — End: ?
  Filled 2022-02-03: qty 1, 90d supply, fill #0

## 2022-02-04 ENCOUNTER — Other Ambulatory Visit (HOSPITAL_COMMUNITY): Payer: Self-pay

## 2022-02-05 LAB — MISC LABCORP TEST (SEND OUT): Labcorp test code: 252794

## 2022-02-10 ENCOUNTER — Encounter: Payer: Self-pay | Admitting: Gastroenterology

## 2022-02-12 ENCOUNTER — Other Ambulatory Visit (HOSPITAL_COMMUNITY): Payer: Self-pay

## 2022-02-12 ENCOUNTER — Encounter: Payer: Self-pay | Admitting: Internal Medicine

## 2022-02-12 NOTE — Progress Notes (Signed)
error 

## 2022-02-17 ENCOUNTER — Encounter (HOSPITAL_BASED_OUTPATIENT_CLINIC_OR_DEPARTMENT_OTHER): Payer: Self-pay | Admitting: Pharmacist

## 2022-02-17 ENCOUNTER — Other Ambulatory Visit (HOSPITAL_BASED_OUTPATIENT_CLINIC_OR_DEPARTMENT_OTHER): Payer: Self-pay

## 2022-02-19 ENCOUNTER — Other Ambulatory Visit: Payer: Self-pay

## 2022-02-19 ENCOUNTER — Other Ambulatory Visit (HOSPITAL_BASED_OUTPATIENT_CLINIC_OR_DEPARTMENT_OTHER): Payer: Self-pay

## 2022-02-22 ENCOUNTER — Ambulatory Visit: Payer: 59 | Admitting: Internal Medicine

## 2022-03-08 ENCOUNTER — Telehealth: Payer: Self-pay | Admitting: Gastroenterology

## 2022-03-08 ENCOUNTER — Other Ambulatory Visit: Payer: Self-pay | Admitting: Internal Medicine

## 2022-03-08 NOTE — Telephone Encounter (Signed)
Patient wife called, Requesting a call back. States she would to speak to a nurse regarding patient's blood work result. Please call to advise.

## 2022-03-08 NOTE — Telephone Encounter (Signed)
This pt is calling for lab results from recent hospital admission see 01/27/22  miscellaneous lab note.

## 2022-03-09 ENCOUNTER — Ambulatory Visit: Payer: 59 | Admitting: Gastroenterology

## 2022-03-09 ENCOUNTER — Other Ambulatory Visit: Payer: Self-pay

## 2022-03-09 MED ORDER — DEXCOM G7 SENSOR MISC
3 refills | Status: DC
  Filled 2022-03-09: qty 3, 30d supply, fill #0
  Filled 2022-04-07: qty 3, 30d supply, fill #1

## 2022-03-09 NOTE — Telephone Encounter (Signed)
Copies placed on Carl Best NP desk:  Please see notes below and advise

## 2022-03-10 ENCOUNTER — Other Ambulatory Visit: Payer: Self-pay

## 2022-03-10 NOTE — Telephone Encounter (Addendum)
Spoke to pt wife Anderson Malta. Anderson Malta made aware of recent results and reminded of appointment with Vicie Mutters PA: 04/08/2022 at 10:00 AM  Anderson Malta verbalized understanding with all questions answered.

## 2022-03-10 NOTE — Telephone Encounter (Signed)
Dean Glass, thank you for getting these results. I have reviewed these lab results, pls inform patient these tests were all negative, no genit etiology for pancreatitis identified. No evidence of cystic fibrosis. Patient has a follow up appt with [name] 03/2022.   Dr. Candis Schatz, [facility]

## 2022-03-11 ENCOUNTER — Other Ambulatory Visit: Payer: Self-pay

## 2022-03-17 ENCOUNTER — Telehealth: Payer: Self-pay | Admitting: *Deleted

## 2022-03-17 NOTE — Telephone Encounter (Signed)
Rec'd call from [facility] pharmacist w/ [employer]. Needing to verify if pt went to GI appt for Pancreatitis. Inform Dean Glass appt was cx and reschedule for 04/08/22. He also stated pt had high LDL wanting to know if he is own any statin. Req send to MD to f/u on high LDL.Marland KitchenJohny Glass

## 2022-03-18 ENCOUNTER — Encounter: Payer: Self-pay | Admitting: Internal Medicine

## 2022-03-18 NOTE — Telephone Encounter (Signed)
Rx never refill  by you.. pls advise.Marland KitchenJohny Chess

## 2022-03-19 ENCOUNTER — Other Ambulatory Visit: Payer: Self-pay | Admitting: Internal Medicine

## 2022-03-19 MED ORDER — PANCRELIPASE (LIP-PROT-AMYL) 12000-38000 UNITS PO CPEP: 12000.0000 [IU] | ORAL_CAPSULE | Freq: Three times a day (TID) | ORAL | 11 refills | Status: DC

## 2022-03-19 NOTE — Telephone Encounter (Signed)
Dean Glass has pancreatitis.  His pancreatitis was probably triggered by a statin medication.  Thanks

## 2022-04-07 ENCOUNTER — Other Ambulatory Visit: Payer: Self-pay

## 2022-04-07 ENCOUNTER — Telehealth: Payer: Self-pay

## 2022-04-07 NOTE — Telephone Encounter (Signed)
Chart reviewed for the results of pancreatic genetic testing that was done on 01/27/2022: [employer] notified and requested fax: Fax received and Copy made and Copy to be sent to be scanned into Epic

## 2022-04-07 NOTE — Progress Notes (Signed)
04/08/2022 Dean Glass [id number] 1976-01-06  Referring provider: Cassandria Anger, MD Primary GI doctor: Dr.  Hilarie Fredrickson (Dr. Ardis Hughs)  ASSESSMENT AND PLAN:   Idiopathic acute pancreatitis without infection or necrosis Unremarkable AB [place], unremarkable trigs No metabolic derangements to account for the pancreatitis., Medications reviewed as potential cause of pancreatitis, none identified.  IgG4 negative, negative PRSS1, SPINK1, CFTR Last MRCP: 08/2021, will repeat MRCP since it has been 2-3 months since last acute pancreatitis, May need to consider EUS/ERCP in the future based off imaging, will discuss further with Dr. Hilarie Fredrickson.  If this is thought to be needed, patient would like his colonoscopy done with that procedure.  Ultimately may benefit from cholecystectomy., Can consider amlodipine once daily for prevention of recurrent acute pancreatitis in the mean time.    EPI secondary chronic pancreatitis Increase water and do low fat small meals.  Continue creon take before meals, information given   Colon cancer screening Due for colonoscopy, if patient needs EUS/other procedure he would like to try to have at the same time to only be put to sleep once. Will discuss with Dr. Hilarie Fredrickson.  If not can schedule.   Type 2 diabetes mellitus with other specified complication, without long-term current use of insulin (HCC) Diet controlled, has Dexcom, has lost 20 lbs intentionally with diet  History of Present Illness:  46 y.o. male  with a past medical history of hyperlipidemia, diabetes, obesity, coronary artery disease, history of recurrent acute pancreatitis and others listed below, returns to clinic today for evaluation of recurrent acute pancreatitis. 09/21/2021 office visit with Dr. Ardis Hughs  04/2021, 06/2021 recurrent pancreatitis. 06/2021 abdominal ultrasound without gallstones or, normal common bile duct. 08/2021  MRCP normal liver, normal gallbladder somewhat atrophic  pancreas with small cystic foci measuring up to 7 mm throughout the pancreas, normal main pancreatic duct surveillance MRI in 1 year  Due 08/2022. 03/2021 normal triglycerides Normal IgG4 Negative PRSS1,SPINK1, CFTR DNA test Brother died age 9 hemodialysis but also had "ruptured cyst in his pancreas", grandmother died of pancreatic cancer significant alcohol  Patient had hospital admission 07/31-08/4 for acute pancreatitis, CTA showed no cirrhosis, no complications. Discontinued metformin as a possibility.    He has not had any AB pain since leaving the hospital.  Denies nausea, vomiting.  BM's are soft but formed, once or twice a day. No melena, no hematochezia.  He is on creon, has been taking after his meals.  No AB bloating.  No fever, chills.  He has cut out carbs since the hospital, trying to eat more chicken, less red meat.  1 beer very rare, about 2 beers a month, last one was 2-3 weeks ago. He does not smoke and no drug use.  Has never had colonoscopy, had paternal uncle with colon cancer in late 73's.  No GERD, trouble swallowing.  He has lost about 20 lbs but has changed his diet.   He  reports that he quit smoking about 7 years ago. His smoking use included cigarettes. He smoked an average of .5 packs per day. He has quit using smokeless tobacco. He reports current alcohol use. He reports that he does not use drugs. His family history includes Coronary artery disease in his father; Diabetes in his mother; Early death (age of onset: 78) in his brother; Heart disease in his father.   Current Medications:      Current Outpatient Medications (Analgesics):    aspirin EC 81 MG tablet, Take 81 mg by mouth daily.  Swallow whole.   Current Outpatient Medications (Other):    Blood Glucose Monitoring Suppl (ONETOUCH VERIO) w/Device KIT, 1 Units by Does not apply route daily as needed.   Continuous Blood Gluc Receiver (DEXCOM G7 RECEIVER) DEVI, Use as directed to check blood  sugars   Continuous Blood Gluc Sensor (DEXCOM G7 SENSOR) MISC, Use as directed to check blood sugars   glucose blood (ONETOUCH VERIO) test strip, 1 each by Other route daily.   Lancets (ONETOUCH ULTRASOFT) lancets, Use as instructed   lipase/protease/amylase (CREON) 12000-38000 units CPEP capsule, Take 1 capsule (12,000 Units total) by mouth 3 (three) times daily with meals.  Surgical History:  He  has a past surgical history that includes Vasectomy.  Current Medications, Allergies, Past Medical History, Past Surgical History, Family History and Social History were reviewed in [employer] record.  Physical Exam: BP 100/80 (BP Location: Left Arm, Patient Position: Sitting)   Pulse 95   Ht _0  (1.702 m)   Wt 194 lb (88 kg)   BMI 30.38 kg/m  General:   Pleasant, well developed male in no acute distress Heart : Regular rate and rhythm; no murmurs Pulm: Clear anteriorly; no wheezing Abdomen:  Soft, Obese AB, Active bowel sounds. No tenderness . , No organomegaly appreciated. Rectal: Not evaluated Extremities:  without  edema. Neurologic:  Alert and  oriented x4;  No focal deficits.  Psych:  Cooperative. Normal mood and affect.   Vladimir Crofts, PA-C 04/08/22

## 2022-04-08 ENCOUNTER — Other Ambulatory Visit: Payer: Self-pay

## 2022-04-08 ENCOUNTER — Encounter: Payer: Self-pay | Admitting: Physician Assistant

## 2022-04-08 ENCOUNTER — Ambulatory Visit (INDEPENDENT_AMBULATORY_CARE_PROVIDER_SITE_OTHER): Payer: 59 | Admitting: Physician Assistant

## 2022-04-08 ENCOUNTER — Other Ambulatory Visit (INDEPENDENT_AMBULATORY_CARE_PROVIDER_SITE_OTHER): Payer: 59

## 2022-04-08 VITALS — BP 100/80 | HR 95 | Ht 67.0 in | Wt 194.0 lb

## 2022-04-08 DIAGNOSIS — E1169 Type 2 diabetes mellitus with other specified complication: Secondary | ICD-10-CM | POA: Diagnosis not present

## 2022-04-08 DIAGNOSIS — K8500 Idiopathic acute pancreatitis without necrosis or infection: Secondary | ICD-10-CM

## 2022-04-08 DIAGNOSIS — Z1211 Encounter for screening for malignant neoplasm of colon: Secondary | ICD-10-CM | POA: Diagnosis not present

## 2022-04-08 LAB — CBC WITH DIFFERENTIAL/PLATELET
Basophils Absolute: 0 10*3/uL (ref 0.0–0.1)
Basophils Relative: 0.5 % (ref 0.0–3.0)
Eosinophils Absolute: 0.1 10*3/uL (ref 0.0–0.7)
Eosinophils Relative: 1.8 % (ref 0.0–5.0)
HCT: 42 % (ref 39.0–52.0)
Hemoglobin: 14.2 g/dL (ref 13.0–17.0)
Lymphocytes Relative: 41.3 % (ref 12.0–46.0)
Lymphs Abs: 2.7 10*3/uL (ref 0.7–4.0)
MCHC: 33.9 g/dL (ref 30.0–36.0)
MCV: 89.4 fl (ref 78.0–100.0)
Monocytes Absolute: 0.5 10*3/uL (ref 0.1–1.0)
Monocytes Relative: 8 % (ref 3.0–12.0)
Neutro Abs: 3.2 10*3/uL (ref 1.4–7.7)
Neutrophils Relative %: 48.4 % (ref 43.0–77.0)
Platelets: 263 10*3/uL (ref 150.0–400.0)
RBC: 4.7 Mil/uL (ref 4.22–5.81)
RDW: 12.5 % (ref 11.5–15.5)
WBC: 6.6 10*3/uL (ref 4.0–10.5)

## 2022-04-08 LAB — COMPREHENSIVE METABOLIC PANEL
ALT: 22 U/L (ref 0–53)
AST: 15 U/L (ref 0–37)
Albumin: 4.5 g/dL (ref 3.5–5.2)
Alkaline Phosphatase: 48 U/L (ref 39–117)
BUN: 18 mg/dL (ref 6–23)
CO2: 27 mEq/L (ref 19–32)
Calcium: 9.8 mg/dL (ref 8.4–10.5)
Chloride: 103 mEq/L (ref 96–112)
Creatinine, Ser: 0.91 mg/dL (ref 0.40–1.50)
GFR: 100.86 mL/min (ref >60.00)
Glucose, Bld: 106 mg/dL — ABNORMAL HIGH (ref 70–99)
Potassium: 4.6 mEq/L (ref 3.5–5.1)
Sodium: 137 mEq/L (ref 135–145)
Total Bilirubin: 0.5 mg/dL (ref 0.2–1.2)
Total Protein: 7.6 g/dL (ref 6.0–8.3)

## 2022-04-08 LAB — AMYLASE: Amylase: 25 U/L — ABNORMAL LOW (ref 27–131)

## 2022-04-08 LAB — LIPASE: Lipase: 9 U/L — ABNORMAL LOW (ref 11.0–59.0)

## 2022-04-08 NOTE — Patient Instructions (Addendum)
Your provider has requested that you go to the basement level for lab work before leaving today. Press "B" on the elevator. The lab is located at the first door on the left as you exit the elevator.  You have been scheduled for an MRI at [facility] on Saturday, 04/17/22. Your appointment time is 800 AM. Please arrive to admitting (at main entrance of the hospital) 30 minutes prior to your appointment time for registration purposes. Please make certain not to have anything to eat or drink 4 hours prior to your test. In addition, if you have any metal in your body, have a pacemaker or defibrillator, please be sure to let your ordering physician know. This test typically takes 45 minutes to 1 hour to complete. Should you need to reschedule, please call [phone] to do so.   Do smaller more frequent meals Try to do low fat Do more lean protein Avoid too much fiber like lentils and beans- do smaller portions.  Avoid alcohol and do not smoke.  Remember to take the pills 2 with every meal and 1 with snacks  Pancreatitic Eating Plan What are tips for following this plan? Reading food labels Use the information on food labels to help keep track of how much fat you eat: Check the serving size. Look for the amount of total fat in grams (g) in one serving. Low-fat foods have 3 g of fat or less per serving. Fat-free foods have 0.5 g of fat or less per serving. Keep track of how much fat you eat based on how many servings you eat. For example, if you eat two servings, the amount of fat you eat will be two times what is listed on the label. Shopping  Buy low-fat or nonfat foods, such as: Fresh, frozen, or canned fruits and vegetables. Grains, including pasta, bread, and rice. Lean meat, poultry, fish, and other protein foods. Low-fat or nonfat dairy. Avoid buying bakery products and other sweets made with whole milk, butter, and eggs. Avoid buying snack foods with added fat, such as anything with  butter or cheese flavoring. Cooking Remove skin from poultry, and remove extra fat from meat. Limit the amount of fat and oil you use to 6 teaspoons or less per day. Cook using low-fat methods, such as boiling, broiling, grilling, steaming, or baking. Use spray oil to cook. Add fat-free chicken broth to add flavor and moisture. Avoid adding cream to thicken soups or sauces. Use other thickeners such as corn starch or tomato paste. Meal planning  Eat a low-fat diet as told by your dietitian. For most people, this means having no more than 55-65 grams of fat each day. Eat small, frequent meals throughout the day. For example, you may have 5-6 small meals instead of 3 large meals. Drink enough fluid to keep your urine pale yellow. Do not drink alcohol. Talk to your health care provider if you need help stopping. Limit how much caffeine you have, including black coffee, black and green tea, caffeinated soft drinks, and energy drinks. General information Let your health care provider or dietitian know if you have unplanned weight loss on this eating plan. You may be instructed to follow a clear liquid diet during a flare of symptoms. Talk with your health care provider about how to manage your diet during symptoms of a flare. Take any vitamins or supplements as told by your health care provider. Work with a Microbiologist, especially if you have other conditions such as obesity or diabetes  mellitus. What foods should I avoid? Fruits Fried fruits. Fruits served with butter or cream. Vegetables Fried vegetables. Vegetables cooked with butter, cheese, or cream. Grains Biscuits, waffles, donuts, pastries, and croissants. Pies and cookies. Butter-flavored popcorn. Regular crackers. Meats and other protein foods Fatty cuts of meat. Poultry with skin. Organ meats. Bacon, sausage, and cold cuts. Whole eggs. Nuts and nut butters. Dairy Whole and 2% milk. Whole milk yogurt. Whole milk ice cream. Cream and  half-and-half. Cream cheese. Sour cream. Cheese. Beverages Wine, beer, and liquor. The items listed above may not be a complete list of foods and beverages to avoid. Contact a dietitian for more information.  This information is not intended to replace advice given to you by your health care provider. Make sure you discuss any questions you have with your health care provider. Document Revised: 10/05/2018 Document Reviewed: 09/20/2017 Elsevier Patient Education  [place].

## 2022-04-15 NOTE — Progress Notes (Signed)
Addendum: Reviewed and agree with assessment and management plan. Agree with repeat MRI/MCRP.  This can be reviewed and then perhaps get Mansouraty's opinion regarding EUS. Jylan Loeza, Lajuan Lines, MD

## 2022-04-17 ENCOUNTER — Other Ambulatory Visit: Payer: Self-pay | Admitting: Physician Assistant

## 2022-04-17 ENCOUNTER — Ambulatory Visit (HOSPITAL_COMMUNITY)
Admission: RE | Admit: 2022-04-17 | Discharge: 2022-04-17 | Disposition: A | Discharge status: Home / Self Care | Payer: 59 | Source: Ambulatory Visit | Attending: Physician Assistant | Admitting: Physician Assistant

## 2022-04-17 DIAGNOSIS — K8500 Idiopathic acute pancreatitis without necrosis or infection: Secondary | ICD-10-CM

## 2022-04-17 MED ORDER — GADOBUTROL 1 MMOL/ML IV SOLN
9.0000 mL | Freq: Once | INTRAVENOUS | Status: AC | PRN
  Administered 2022-04-17: 9 mL via INTRAVENOUS

## 2022-04-22 ENCOUNTER — Other Ambulatory Visit: Payer: Self-pay

## 2022-04-22 ENCOUNTER — Encounter: Payer: Self-pay | Admitting: Gastroenterology

## 2022-04-22 DIAGNOSIS — Z1211 Encounter for screening for malignant neoplasm of colon: Secondary | ICD-10-CM

## 2022-04-22 DIAGNOSIS — K862 Cyst of pancreas: Secondary | ICD-10-CM

## 2022-04-22 MED ORDER — PEG 3350-KCL-NA BICARB-NACL 420 G PO SOLR: 4000.0000 mL | Freq: Once | ORAL | 0 refills | Status: AC

## 2022-04-22 NOTE — Telephone Encounter (Signed)
I am working on his right now. I will let him know as soon as I am finished. Thank you

## 2022-04-22 NOTE — Telephone Encounter (Signed)
Dean Glass the pt last saw [name]

## 2022-05-03 ENCOUNTER — Other Ambulatory Visit: Payer: Self-pay

## 2022-05-03 ENCOUNTER — Ambulatory Visit (INDEPENDENT_AMBULATORY_CARE_PROVIDER_SITE_OTHER): Payer: 59 | Admitting: Internal Medicine

## 2022-05-03 ENCOUNTER — Encounter: Payer: Self-pay | Admitting: Internal Medicine

## 2022-05-03 VITALS — BP 102/68 | HR 96 | Temp 98.4°F | Ht 67.0 in | Wt 191.8 lb

## 2022-05-03 DIAGNOSIS — M722 Plantar fascial fibromatosis: Secondary | ICD-10-CM | POA: Diagnosis not present

## 2022-05-03 DIAGNOSIS — K8500 Idiopathic acute pancreatitis without necrosis or infection: Secondary | ICD-10-CM | POA: Diagnosis not present

## 2022-05-03 DIAGNOSIS — E1169 Type 2 diabetes mellitus with other specified complication: Secondary | ICD-10-CM | POA: Diagnosis not present

## 2022-05-03 MED ORDER — PANCRELIPASE (LIP-PROT-AMYL) 12000-38000 UNITS PO CPEP
12000.0000 [IU] | ORAL_CAPSULE | Freq: Three times a day (TID) | ORAL | 3 refills | Status: AC
  Filled 2022-05-03: qty 90, 30d supply, fill #0
  Filled 2022-07-07 – 2022-10-01 (×4): qty 90, 30d supply, fill #1

## 2022-05-03 MED ORDER — DEXCOM G7 SENSOR MISC
3 refills | Status: DC
  Filled 2022-05-03: qty 3, 30d supply, fill #0
  Filled 2022-06-09: qty 3, 30d supply, fill #1
  Filled 2022-07-07: qty 3, 30d supply, fill #2
  Filled 2022-08-03: qty 3, 30d supply, fill #3
  Filled 2022-09-06: qty 3, 30d supply, fill #4
  Filled 2022-09-26 – 2022-09-29 (×2): qty 3, 30d supply, fill #5
  Filled 2022-10-27: qty 3, 30d supply, fill #6
  Filled 2022-11-27: qty 3, 30d supply, fill #7
  Filled 2022-12-27: qty 3, 30d supply, fill #8
  Filled 2023-01-23: qty 3, 30d supply, fill #9
  Filled 2023-03-08: qty 3, 30d supply, fill #10
  Filled 2023-04-25: qty 3, 30d supply, fill #11

## 2022-05-03 NOTE — Progress Notes (Signed)
Subjective:  Patient ID: Dean Glass, male    DOB: 1975-10-02  Age: 46 y.o. MRN: [id number]  CC: Follow-up (3 month f/u)   HPI Dean Glass presents for pancreatitis, DM C/o L heel pain - new   Outpatient Medications Prior to Visit  Medication Sig Dispense Refill   aspirin EC 81 MG tablet Take 81 mg by mouth daily. Swallow whole.     Blood Glucose Monitoring Suppl (ONETOUCH VERIO) w/Device KIT 1 Units by Does not apply route daily as needed. 1 kit 1   Continuous Blood Gluc Receiver (DEXCOM G7 RECEIVER) DEVI Use as directed to check blood sugars 1 each 0   glucose blood (ONETOUCH VERIO) test strip 1 each by Other route daily. 100 strip 3   Lancets (ONETOUCH ULTRASOFT) lancets Use as instructed 100 each 5   Continuous Blood Gluc Sensor (DEXCOM G7 SENSOR) MISC Use as directed to check blood sugars 3 each 3   lipase/protease/amylase (CREON) 12000-38000 units CPEP capsule Take 1 capsule (12,000 Units total) by mouth 3 (three) times daily with meals. 90 capsule 11   No facility-administered medications prior to visit.    ROS: Review of Systems  Constitutional:  Negative for appetite change, fatigue and unexpected weight change.  HENT:  Negative for congestion, nosebleeds, sneezing, sore throat and trouble swallowing.   Eyes:  Negative for itching and visual disturbance.  Respiratory:  Negative for cough.   Cardiovascular:  Negative for chest pain, palpitations and leg swelling.  Gastrointestinal:  Negative for abdominal distention, blood in stool, diarrhea and nausea.  Genitourinary:  Negative for frequency and hematuria.  Musculoskeletal:  Negative for back pain, gait problem, joint swelling and neck pain.  Skin:  Negative for rash.  Neurological:  Negative for dizziness, tremors, speech difficulty and weakness.  Psychiatric/Behavioral:  Negative for agitation, dysphoric mood and sleep disturbance. The patient is not nervous/anxious.     Objective:  BP 102/68 (BP  Location: Left Arm)   Pulse 96   Temp 98.4 F (36.9 C) (Oral)   Ht _0  (1.702 m)   Wt 191 lb 12.8 oz (87 kg)   SpO2 96%   BMI 30.04 kg/m   BP Readings from Last 3 Encounters:  05/03/22 102/68  04/08/22 100/80  02/01/22 100/64    Wt Readings from Last 3 Encounters:  05/03/22 191 lb 12.8 oz (87 kg)  04/08/22 194 lb (88 kg)  02/01/22 197 lb 3.2 oz (89.4 kg)    Physical Exam Constitutional:      General: He is not in acute distress.    Appearance: Normal appearance. He is well-developed.     Comments: NAD  Eyes:     Conjunctiva/sclera: Conjunctivae normal.     Pupils: Pupils are equal, round, and reactive to light.  Neck:     Thyroid: No thyromegaly.     Vascular: No JVD.  Cardiovascular:     Rate and Rhythm: Normal rate and regular rhythm.     Heart sounds: Normal heart sounds. No murmur heard.    No friction rub. No gallop.  Pulmonary:     Effort: Pulmonary effort is normal. No respiratory distress.     Breath sounds: Normal breath sounds. No wheezing or rales.  Chest:     Chest wall: No tenderness.  Abdominal:     General: Bowel sounds are normal. There is no distension.     Palpations: Abdomen is soft. There is no mass.     Tenderness: There is no abdominal  tenderness. There is no guarding or rebound.  Musculoskeletal:        General: No tenderness. Normal range of motion.     Cervical back: Normal range of motion.  Lymphadenopathy:     Cervical: No cervical adenopathy.  Skin:    General: Skin is warm and dry.     Findings: No rash.  Neurological:     Mental Status: He is alert and oriented to person, place, and time.     Cranial Nerves: No cranial nerve deficit.     Motor: No abnormal muscle tone.     Coordination: Coordination normal.     Gait: Gait normal.     Deep Tendon Reflexes: Reflexes are normal and symmetric.  Psychiatric:        Behavior: Behavior normal.        Thought Content: Thought content normal.        Judgment: Judgment normal.      Lab Results  Component Value Date   WBC 6.6 04/08/2022   HGB 14.2 04/08/2022   HCT 42.0 04/08/2022   PLT 263.0 04/08/2022   GLUCOSE 106 (H) 04/08/2022   CHOL 201 (H) 10/19/2021   TRIG 74 01/25/2022   HDL 39.70 10/19/2021   LDLDIRECT 124.0 10/19/2021   LDLCALC 74 03/31/2021   ALT 22 04/08/2022   AST 15 04/08/2022   NA 137 04/08/2022   K 4.6 04/08/2022   CL 103 04/08/2022   CREATININE 0.91 04/08/2022   BUN 18 04/08/2022   CO2 27 04/08/2022   TSH 1.91 10/19/2021   PSA 1.11 10/19/2021   HGBA1C 6.8 (H) 10/19/2021    MR ABDOMEN MRCP W WO CONTAST  Result Date: 04/18/2022 CLINICAL DATA:  Abdominal pain.  Pancreatitis. EXAM: MRI ABDOMEN WITHOUT AND WITH CONTRAST (INCLUDING MRCP) TECHNIQUE: Multiplanar multisequence MR imaging of the abdomen was performed both before and after the administration of intravenous contrast. Heavily T2-weighted images of the biliary and pancreatic ducts were obtained, and three-dimensional MRCP images were rendered by post processing. CONTRAST:  2m GADAVIST GADOBUTROL 1 MMOL/ML IV SOLN COMPARISON:  CT on 01/24/2022, and MRI on 09/12/2021 FINDINGS: Lower chest: No acute findings. Hepatobiliary: No hepatic masses identified. Mild diffuse hepatic steatosis noted. Gallbladder is unremarkable. No evidence of biliary ductal dilatation or choledocholithiasis. Pancreas: No evidence of pancreatic mass or ductal dilatation. Pancreas is atrophic. No evidence of acute pancreatic edema or peripancreatic inflammatory changes. Several tiny less than 5 mm cystic foci are seen within the pancreatic body and tail, which remain stable since previous study. Evidence of peripancreatic fluid collections. Spleen:  Within normal limits in size and appearance. Adrenals/Urinary Tract: No suspicious masses identified. No evidence of hydronephrosis. Stomach/Bowel: Unremarkable. Vascular/Lymphatic: No pathologically enlarged lymph nodes identified. No acute vascular findings. Other:  None.  Musculoskeletal:  No suspicious bone lesions identified. IMPRESSION: No acute findings.  No radiographic evidence of acute pancreatitis. Several tiny less than 5 mm cystic foci within the pancreatic body and tail, which remain stable since previous study. Differential diagnosis includes tiny pseudocysts and indolent side-branch IPMNs. Recommend continued follow-up by MRI in 1 year. This recommendation follows ACR consensus guidelines: Management of Incidental Pancreatic Cysts: A White Paper of the ACR Incidental Findings Committee. [id number];24:[id number] No evidence of cholecystitis, biliary ductal dilatation, or choledocholithiasis. Mild hepatic steatosis. Electronically Signed   By: JMarlaine HindM.D.   On: 04/18/2022 15:24   MR 3D Recon At Scanner  Result Date: 04/18/2022 CLINICAL DATA:  Abdominal pain.  Pancreatitis. EXAM:  MRI ABDOMEN WITHOUT AND WITH CONTRAST (INCLUDING MRCP) TECHNIQUE: Multiplanar multisequence MR imaging of the abdomen was performed both before and after the administration of intravenous contrast. Heavily T2-weighted images of the biliary and pancreatic ducts were obtained, and three-dimensional MRCP images were rendered by post processing. CONTRAST:  64m GADAVIST GADOBUTROL 1 MMOL/ML IV SOLN COMPARISON:  CT on 01/24/2022, and MRI on 09/12/2021 FINDINGS: Lower chest: No acute findings. Hepatobiliary: No hepatic masses identified. Mild diffuse hepatic steatosis noted. Gallbladder is unremarkable. No evidence of biliary ductal dilatation or choledocholithiasis. Pancreas: No evidence of pancreatic mass or ductal dilatation. Pancreas is atrophic. No evidence of acute pancreatic edema or peripancreatic inflammatory changes. Several tiny less than 5 mm cystic foci are seen within the pancreatic body and tail, which remain stable since previous study. Evidence of peripancreatic fluid collections. Spleen:  Within normal limits in size and appearance. Adrenals/Urinary Tract: No  suspicious masses identified. No evidence of hydronephrosis. Stomach/Bowel: Unremarkable. Vascular/Lymphatic: No pathologically enlarged lymph nodes identified. No acute vascular findings. Other:  None. Musculoskeletal:  No suspicious bone lesions identified. IMPRESSION: No acute findings.  No radiographic evidence of acute pancreatitis. Several tiny less than 5 mm cystic foci within the pancreatic body and tail, which remain stable since previous study. Differential diagnosis includes tiny pseudocysts and indolent side-branch IPMNs. Recommend continued follow-up by MRI in 1 year. This recommendation follows ACR consensus guidelines: Management of Incidental Pancreatic Cysts: A White Paper of the ACR Incidental Findings Committee. [id number];[id number] No evidence of cholecystitis, biliary ductal dilatation, or choledocholithiasis. Mild hepatic steatosis. Electronically Signed   By: JMarlaine HindM.D.   On: 04/18/2022 15:24    Assessment & Plan:   Problem List Items Addressed This Visit     Acute pancreatitis - Primary    No relapse Off Lipitor, Metformin      Relevant Medications   lipase/protease/amylase (CREON) 12000-38000 units CPEP capsule   Diabetes ([place]    Cont to use Dexcom G7       Plantar fasciitis    R 2023         Meds ordered this encounter  Medications   lipase/protease/amylase (CREON) 12000-38000 units CPEP capsule    Sig: Take 1 capsule (12,000 Units total) by mouth 3 (three) times daily with meals.    Dispense:  270 capsule    Refill:  3   Continuous Blood Gluc Sensor (DEXCOM G7 SENSOR) MISC    Sig: Use as directed to check blood sugars    Dispense:  9 each    Refill:  3    Dx E11.9      Follow-up: No follow-ups on file.  AWalker Kehr MD

## 2022-05-03 NOTE — Assessment & Plan Note (Signed)
Cont to use Dexcom G7

## 2022-05-03 NOTE — Patient Instructions (Addendum)
[place] flip flops Spenco inserts  Blue-Emu cream --use 2-3 times a day

## 2022-05-03 NOTE — Assessment & Plan Note (Signed)
No relapse Off Lipitor, Metformin

## 2022-05-03 NOTE — Assessment & Plan Note (Signed)
R 2023

## 2022-05-04 ENCOUNTER — Other Ambulatory Visit: Payer: Self-pay

## 2022-05-06 ENCOUNTER — Other Ambulatory Visit: Payer: Self-pay

## 2022-05-24 ENCOUNTER — Encounter (HOSPITAL_COMMUNITY): Payer: Self-pay | Admitting: Gastroenterology

## 2022-05-24 ENCOUNTER — Encounter: Payer: Self-pay | Admitting: Gastroenterology

## 2022-05-24 NOTE — Progress Notes (Signed)
Attempted to obtain medical history via telephone, unable to reach at this time. HIPAA compliant voicemail message left requesting return call to pre surgical testing department. 

## 2022-05-25 ENCOUNTER — Encounter (HOSPITAL_COMMUNITY): Payer: Self-pay | Admitting: Gastroenterology

## 2022-05-31 ENCOUNTER — Encounter (HOSPITAL_COMMUNITY)
Admission: RE | Disposition: A | Discharge status: Home / Self Care | Payer: Self-pay | Source: Home / Self Care | Attending: Gastroenterology

## 2022-05-31 ENCOUNTER — Ambulatory Visit (HOSPITAL_COMMUNITY): Payer: 59 | Admitting: Anesthesiology

## 2022-05-31 ENCOUNTER — Ambulatory Visit (HOSPITAL_BASED_OUTPATIENT_CLINIC_OR_DEPARTMENT_OTHER): Payer: 59 | Admitting: Anesthesiology

## 2022-05-31 ENCOUNTER — Encounter (HOSPITAL_COMMUNITY): Payer: Self-pay | Admitting: Gastroenterology

## 2022-05-31 ENCOUNTER — Ambulatory Visit (HOSPITAL_COMMUNITY)
Admission: RE | Admit: 2022-05-31 | Discharge: 2022-05-31 | Disposition: A | Discharge status: Home / Self Care | Payer: 59 | Attending: Gastroenterology | Admitting: Gastroenterology

## 2022-05-31 ENCOUNTER — Other Ambulatory Visit: Payer: Self-pay

## 2022-05-31 DIAGNOSIS — K3189 Other diseases of stomach and duodenum: Secondary | ICD-10-CM | POA: Diagnosis not present

## 2022-05-31 DIAGNOSIS — K449 Diaphragmatic hernia without obstruction or gangrene: Secondary | ICD-10-CM

## 2022-05-31 DIAGNOSIS — Z1211 Encounter for screening for malignant neoplasm of colon: Secondary | ICD-10-CM | POA: Diagnosis not present

## 2022-05-31 DIAGNOSIS — K838 Other specified diseases of biliary tract: Secondary | ICD-10-CM | POA: Diagnosis not present

## 2022-05-31 DIAGNOSIS — K2100 Gastro-esophageal reflux disease with esophagitis, without bleeding: Secondary | ICD-10-CM | POA: Diagnosis not present

## 2022-05-31 DIAGNOSIS — K5750 Diverticulosis of both small and large intestine without perforation or abscess without bleeding: Secondary | ICD-10-CM | POA: Diagnosis not present

## 2022-05-31 DIAGNOSIS — K209 Esophagitis, unspecified without bleeding: Secondary | ICD-10-CM | POA: Diagnosis not present

## 2022-05-31 DIAGNOSIS — E119 Type 2 diabetes mellitus without complications: Secondary | ICD-10-CM

## 2022-05-31 DIAGNOSIS — K862 Cyst of pancreas: Secondary | ICD-10-CM | POA: Insufficient documentation

## 2022-05-31 DIAGNOSIS — K2289 Other specified disease of esophagus: Secondary | ICD-10-CM | POA: Insufficient documentation

## 2022-05-31 DIAGNOSIS — Z87891 Personal history of nicotine dependence: Secondary | ICD-10-CM | POA: Diagnosis not present

## 2022-05-31 DIAGNOSIS — K859 Acute pancreatitis without necrosis or infection, unspecified: Secondary | ICD-10-CM | POA: Diagnosis not present

## 2022-05-31 DIAGNOSIS — I899 Noninfective disorder of lymphatic vessels and lymph nodes, unspecified: Secondary | ICD-10-CM | POA: Insufficient documentation

## 2022-05-31 DIAGNOSIS — I2510 Atherosclerotic heart disease of native coronary artery without angina pectoris: Secondary | ICD-10-CM | POA: Insufficient documentation

## 2022-05-31 DIAGNOSIS — K641 Second degree hemorrhoids: Secondary | ICD-10-CM

## 2022-05-31 DIAGNOSIS — K222 Esophageal obstruction: Secondary | ICD-10-CM | POA: Diagnosis not present

## 2022-05-31 DIAGNOSIS — Z1212 Encounter for screening for malignant neoplasm of rectum: Secondary | ICD-10-CM | POA: Diagnosis not present

## 2022-05-31 HISTORY — PX: BIOPSY: SHX5522

## 2022-05-31 HISTORY — PX: EUS: SHX5427

## 2022-05-31 HISTORY — PX: ESOPHAGOGASTRODUODENOSCOPY: SHX5428

## 2022-05-31 HISTORY — PX: COLONOSCOPY WITH PROPOFOL: SHX5780

## 2022-05-31 HISTORY — DX: Type 2 diabetes mellitus without complications: E11.9

## 2022-05-31 SURGERY — UPPER ENDOSCOPIC ULTRASOUND (EUS) LINEAR
Anesthesia: Monitor Anesthesia Care

## 2022-05-31 MED ORDER — PROPOFOL 10 MG/ML IV BOLUS
INTRAVENOUS | Status: AC
  Filled 2022-05-31: qty 20

## 2022-05-31 MED ORDER — LACTATED RINGERS IV SOLN: Freq: Once | INTRAVENOUS | Status: AC

## 2022-05-31 MED ORDER — LIDOCAINE HCL (CARDIAC) PF 100 MG/5ML IV SOSY
PREFILLED_SYRINGE | INTRAVENOUS | Status: DC | PRN
  Administered 2022-05-31: 100 mg via INTRAVENOUS

## 2022-05-31 MED ORDER — PHENYLEPHRINE HCL (PRESSORS) 10 MG/ML IV SOLN
INTRAVENOUS | Status: AC
  Filled 2022-05-31: qty 1

## 2022-05-31 MED ORDER — SODIUM CHLORIDE 0.9 % IV SOLN: INTRAVENOUS | Status: DC

## 2022-05-31 MED ORDER — PROPOFOL 10 MG/ML IV BOLUS
INTRAVENOUS | Status: DC | PRN
  Administered 2022-05-31: 30 mg via INTRAVENOUS
  Administered 2022-05-31: 20 mg via INTRAVENOUS
  Administered 2022-05-31 (×2): 50 mg via INTRAVENOUS
  Administered 2022-05-31: 20 mg via INTRAVENOUS
  Administered 2022-05-31 (×2): 50 mg via INTRAVENOUS

## 2022-05-31 MED ORDER — PHENYLEPHRINE 80 MCG/ML (10ML) SYRINGE FOR IV PUSH (FOR BLOOD PRESSURE SUPPORT)
PREFILLED_SYRINGE | INTRAVENOUS | Status: DC | PRN
  Administered 2022-05-31 (×2): 80 ug via INTRAVENOUS

## 2022-05-31 MED ORDER — PROPOFOL 1000 MG/100ML IV EMUL
INTRAVENOUS | Status: AC
  Filled 2022-05-31: qty 100

## 2022-05-31 MED ORDER — PROPOFOL 500 MG/50ML IV EMUL
INTRAVENOUS | Status: DC | PRN
  Administered 2022-05-31: 125 ug/kg/min via INTRAVENOUS

## 2022-05-31 SURGICAL SUPPLY — 22 items
ELECT REM PT RETURN 9FT ADLT (ELECTROSURGICAL)
ELECTRODE REM PT RTRN 9FT ADLT (ELECTROSURGICAL) IMPLANT
FCP BXJMBJMB 240X2.8X (CUTTING FORCEPS)
FLOOR PAD 36X40 (MISCELLANEOUS) ×3
FORCEPS BIOP RAD 4 LRG CAP 4 (CUTTING FORCEPS) IMPLANT
FORCEPS BIOP RJ4 240 W/NDL (CUTTING FORCEPS)
FORCEPS BXJMBJMB 240X2.8X (CUTTING FORCEPS) IMPLANT
INJECTOR/SNARE I SNARE (MISCELLANEOUS) IMPLANT
LUBRICANT JELLY 4.5OZ STERILE (MISCELLANEOUS) IMPLANT
MANIFOLD NEPTUNE II (INSTRUMENTS) IMPLANT
NDL SCLEROTHERAPY 25GX240 (NEEDLE) IMPLANT
NEEDLE SCLEROTHERAPY 25GX240 (NEEDLE) IMPLANT
PAD FLOOR 36X40 (MISCELLANEOUS) ×3 IMPLANT
PROBE APC STR FIRE (PROBE) IMPLANT
PROBE INJECTION GOLD (MISCELLANEOUS)
PROBE INJECTION GOLD 7FR (MISCELLANEOUS) IMPLANT
SNARE ROTATE MED OVAL 20MM (MISCELLANEOUS) IMPLANT
SYR 50ML LL SCALE MARK (SYRINGE) IMPLANT
TRAP SPECIMEN MUCOUS 40CC (MISCELLANEOUS) IMPLANT
TUBING ENDO SMARTCAP PENTAX (MISCELLANEOUS) IMPLANT
TUBING IRRIGATION ENDOGATOR (MISCELLANEOUS) ×3 IMPLANT
WATER STERILE IRR 1000ML POUR (IV SOLUTION) IMPLANT

## 2022-05-31 NOTE — Transfer of Care (Signed)
Immediate Anesthesia Transfer of Care Note  Patient: Dean Glass  Procedure(s) Performed: UPPER ENDOSCOPIC ULTRASOUND (EUS) LINEAR COLONOSCOPY WITH PROPOFOL ESOPHAGOGASTRODUODENOSCOPY (EGD) (Left) BIOPSY  Patient Location: Endoscopy Unit  Anesthesia Type:MAC  Level of Consciousness: drowsy and patient cooperative  Airway & Oxygen Therapy: Patient Spontanous Breathing  Post-op Assessment: Report given to RN and Post -op Vital signs reviewed and stable  Post vital signs: Reviewed and stable  Last Vitals:  Vitals Value Taken Time  BP 107/70 1328  Temp    Pulse 76 05/31/22 1328  Resp 13 05/31/22 1328  SpO2 100 % 05/31/22 1328  Vitals shown include unvalidated device data.  Last Pain:  Vitals:   05/31/22 1017  TempSrc: Tympanic  PainSc: 0-No pain         Complications: No notable events documented.

## 2022-05-31 NOTE — Discharge Instructions (Signed)
YOU HAD AN ENDOSCOPIC PROCEDURE TODAY: Refer to the procedure report and other information in the discharge instructions given to you for any specific questions about what was found during the examination. If this information does not answer your questions, please call [place] office at [phone] to clarify.  ° °YOU SHOULD EXPECT: Some feelings of bloating in the abdomen. Passage of more gas than usual. Walking can help get rid of the air that was put into your GI tract during the procedure and reduce the bloating. If you had a lower endoscopy (such as a colonoscopy or flexible sigmoidoscopy) you may notice spotting of blood in your stool or on the toilet paper. Some abdominal soreness may be present for a day or two, also. ° °DIET: Your first meal following the procedure should be a light meal and then it is ok to progress to your normal diet. A half-sandwich or bowl of soup is an example of a good first meal. Heavy or fried foods are harder to digest and may make you feel nauseous or bloated. Drink plenty of fluids but you should avoid alcoholic beverages for 24 hours. If you had a esophageal dilation, please see attached instructions for diet.   ° °ACTIVITY: Your care partner should take you home directly after the procedure. You should plan to take it easy, moving slowly for the rest of the day. You can resume normal activity the day after the procedure however YOU SHOULD NOT DRIVE, use power tools, machinery or perform tasks that involve climbing or major physical exertion for 24 hours (because of the sedation medicines used during the test).  ° °SYMPTOMS TO REPORT IMMEDIATELY: °A gastroenterologist can be reached at any hour. Please call [phone]  for any of the following symptoms:  °Following lower endoscopy (colonoscopy, flexible sigmoidoscopy) °Excessive amounts of blood in the stool  °Significant tenderness, worsening of abdominal pains  °Swelling of the abdomen that is new, acute  °Fever of 100° or  higher  °Following upper endoscopy (EGD, EUS, ERCP, esophageal dilation) °Vomiting of blood or coffee ground material  °New, significant abdominal pain  °New, significant chest pain or pain under the shoulder blades  °Painful or persistently difficult swallowing  °New shortness of breath  °Black, tarry-looking or red, bloody stools ° °FOLLOW UP:  °If any biopsies were taken you will be contacted by phone or by letter within the next 1-3 weeks. Call [phone]  if you have not heard about the biopsies in 3 weeks.  °Please also call with any specific questions about appointments or follow up tests. ° °

## 2022-05-31 NOTE — Progress Notes (Signed)
Pt's blood glucose is 146 @ 1024 per his DexCom/Tasean Mancha Sheryn Bison, Therapist, sports

## 2022-05-31 NOTE — H&P (Signed)
GASTROENTEROLOGY PROCEDURE H&P NOTE   Primary Care Physician: Cassandria Anger, MD  HPI: Dean Glass is a 46 y.o. male who presents for EGD/EUS + Colonoscopy for evaluation of idiopathic pancreatis, rule out microcholedocholithiasis, follow up pancreatic cysts, and colon cancer screening.  Past Medical History:  Diagnosis Date   Diabetes mellitus without complication (HCC)    diet controlled   GERD (gastroesophageal reflux disease)    Seasonal allergies    Past Surgical History:  Procedure Laterality Date   VASECTOMY     Current Facility-Administered Medications  Medication Dose Route Frequency Provider Last Rate Last Admin   0.9 %  sodium chloride infusion   Intravenous Continuous Mansouraty, Telford Nab., MD       lactated ringers infusion   Intravenous Once Mansouraty, Telford Nab., MD        Current Facility-Administered Medications:    0.9 %  sodium chloride infusion, , Intravenous, Continuous, Mansouraty, Telford Nab., MD   lactated ringers infusion, , Intravenous, Once, Mansouraty, Telford Nab., MD Allergies  Allergen Reactions   Atorvastatin     Possible Pancreatitis   Metformin And Related     ?pancreatitis   Family History  Problem Relation Age of Onset   Diabetes Mother    Coronary artery disease Father    Heart disease Father        cad   Early death Brother 65       cystic kidney   Social History   Socioeconomic History   Marital status: Married    Spouse name: Not on file   Number of children: Not on file   Years of education: Not on file   Highest education level: Not on file  Occupational History   Not on file  Tobacco Use   Smoking status: Former    Packs/day: 0.50    Types: Cigarettes    Quit date: 03/13/2015    Years since quitting: 7.2   Smokeless tobacco: Former  Scientific laboratory technician Use: Never used  Substance and Sexual Activity   Alcohol use: Yes    Alcohol/week: 0.0 standard drinks of alcohol    Comment: Social   Drug  use: No   Sexual activity: Yes  Other Topics Concern   Not on file  Social History Narrative   Not on file   Social Determinants of Health   Financial Resource Strain: Not on file  Food Insecurity: Not on file  Transportation Needs: Not on file  Physical Activity: Not on file  Stress: Not on file  Social Connections: Not on file  Intimate Partner Violence: Not on file    Physical Exam: Today's Vitals   05/25/22 1104 05/31/22 1017  BP:  (!) 144/79  Resp:  16  Temp:  98.1 F (36.7 C)  TempSrc:  Tympanic  SpO2:  99%  Weight: 87.1 kg 88 kg  Height:  '5\' 7"'$  (1.702 m)  PainSc:  0-No pain   Body mass index is 30.38 kg/m. GEN: NAD EYE: Sclerae anicteric ENT: MMM CV: Non-tachycardic GI: Soft, NT/ND NEURO:  Alert & Oriented x 3  Lab Results: No results for input(s): "WBC", "HGB", "HCT", "PLT" in the last 72 hours. BMET No results for input(s): "NA", "K", "CL", "CO2", "GLUCOSE", "BUN", "CREATININE", "CALCIUM" in the last 72 hours. LFT No results for input(s): "PROT", "ALBUMIN", "AST", "ALT", "ALKPHOS", "BILITOT", "BILIDIR", "IBILI" in the last 72 hours. PT/INR No results for input(s): "LABPROT", "INR" in the last 72 hours.   Impression /  Plan: This is a 46 y.o.male who presents for EGD/EUS + Colonoscopy for evaluation of idiopathic pancreatis, rule out microcholedocholithiasis, follow up pancreatic cysts, and colon cancer screening.  The risks of an EUS including intestinal perforation, bleeding, infection, aspiration, and medication effects were discussed as was the possibility it may not give a definitive diagnosis if a biopsy is performed.  When a biopsy of the pancreas is done as part of the EUS, there is an additional risk of pancreatitis at the rate of about 1-2%.  It was explained that procedure related pancreatitis is typically mild, although it can be severe and even life threatening, which is why we do not perform random pancreatic biopsies and only biopsy a  lesion/area we feel is concerning enough to warrant the risk.   The risks and benefits of endoscopic evaluation/treatment were discussed with the patient and/or family; these include but are not limited to the risk of perforation, infection, bleeding, missed lesions, lack of diagnosis, severe illness requiring hospitalization, as well as anesthesia and sedation related illnesses.  The patient's history has been reviewed, patient examined, no change in status, and deemed stable for procedure.  The patient and/or family is agreeable to proceed.    Justice Britain, MD [facility] Office # [phone]

## 2022-05-31 NOTE — Anesthesia Postprocedure Evaluation (Signed)
Anesthesia Post Note  Patient: JASHAN COTTEN  Procedure(s) Performed: UPPER ENDOSCOPIC ULTRASOUND (EUS) LINEAR COLONOSCOPY WITH PROPOFOL ESOPHAGOGASTRODUODENOSCOPY (EGD) (Left) BIOPSY     Patient location during evaluation: PACU Anesthesia Type: MAC Level of consciousness: awake and alert Pain management: pain level controlled Vital Signs Assessment: post-procedure vital signs reviewed and stable Respiratory status: spontaneous breathing, nonlabored ventilation and respiratory function stable Cardiovascular status: blood pressure returned to baseline Postop Assessment: no apparent nausea or vomiting Anesthetic complications: no   No notable events documented.  Last Vitals:  Vitals:   05/31/22 1340 05/31/22 1350  BP: 129/80 (!) 136/93  Pulse: 82 81  Resp: 17 13  Temp:    SpO2: 100% 97%    Last Pain:  Vitals:   05/31/22 1350  TempSrc:   PainSc: 0-No pain                 Marthenia Rolling

## 2022-05-31 NOTE — Anesthesia Preprocedure Evaluation (Addendum)
Anesthesia Evaluation  Patient identified by MRN, date of birth, ID band Patient awake    Reviewed: Allergy & Precautions, NPO status , Patient's Chart, lab work & pertinent test results  History of Anesthesia Complications Negative for: history of anesthetic complications  Airway Mallampati: II  TM Distance: >3 FB Neck ROM: Full    Dental no notable dental hx.    Pulmonary former smoker   Pulmonary exam normal        Cardiovascular + CAD  Normal cardiovascular exam     Neuro/Psych negative neurological ROS     GI/Hepatic Neg liver ROS,GERD  ,,Pancreatic cyst   Endo/Other  diabetes, Type 2    Renal/GU negative Renal ROS     Musculoskeletal negative musculoskeletal ROS (+)    Abdominal   Peds  Hematology negative hematology ROS (+)   Anesthesia Other Findings Day of surgery medications reviewed with patient.  Reproductive/Obstetrics                             Anesthesia Physical Anesthesia Plan  ASA: 2  Anesthesia Plan: MAC   Post-op Pain Management: Minimal or no pain anticipated   Induction:   PONV Risk Score and Plan: 1 and Treatment may vary due to age or medical condition and Propofol infusion  Airway Management Planned: Natural Airway and Nasal Cannula  Additional Equipment: None  Intra-op Plan:   Post-operative Plan:   Informed Consent: I have reviewed the patients History and Physical, chart, labs and discussed the procedure including the risks, benefits and alternatives for the proposed anesthesia with the patient or authorized representative who has indicated his/her understanding and acceptance.       Plan Discussed with: CRNA  Anesthesia Plan Comments:        Anesthesia Quick Evaluation

## 2022-05-31 NOTE — Op Note (Signed)
[facility] Patient Name: Dean Glass Procedure Date: 05/31/2022 MRN: [id number] Attending MD: Justice Britain , MD, [id number] Date of Birth: Nov 20, 1975 CSN: [id number] Age: 46 Admit Type: Outpatient Procedure:                Upper EUS Indications:              Pancreatic cyst on MRCP, Acute recurrent                            pancreatitis Providers:                Justice Britain, MD, Burtis Junes, RN, Jeanella Cara, RN, William Dalton, Technician Referring MD:             Lajuan Lines. Pyrtle, MD Medicines:                Monitored Anesthesia Care Complications:            No immediate complications. Estimated Blood Loss:     Estimated blood loss was minimal. Procedure:                Pre-Anesthesia Assessment:                           - Prior to the procedure, a History and Physical                            was performed, and patient medications and                            allergies were reviewed. The patient's tolerance of                            previous anesthesia was also reviewed. The risks                            and benefits of the procedure and the sedation                            options and risks were discussed with the patient.                            All questions were answered, and informed consent                            was obtained. Prior Anticoagulants: The patient has                            taken no anticoagulant or antiplatelet agents                            except for aspirin and has taken no anticoagulant  or antiplatelet agents except for NSAID medication.                            ASA Grade Assessment: II - A patient with mild                            systemic disease. After reviewing the risks and                            benefits, the patient was deemed in satisfactory                            condition to undergo the procedure.                            After obtaining informed consent, the endoscope was                            passed under direct vision. Throughout the                            procedure, the patient's blood pressure, pulse, and                            oxygen saturations were monitored continuously. The                            [id number]-H190 ([id number]) Olympus endoscope was introduced                            through the mouth, and advanced to the second part                            of duodenum. The [id number]-[id number] ([id number]) Olympus                            duodenoscope was introduced through the mouth, and                            advanced to the area of papilla. The [id number]                            ([id number]) Olympus linear ultrasound scope was                            introduced through the mouth, and advanced to the                            duodenum for ultrasound examination from the                            stomach and duodenum. The upper EUS was  accomplished without difficulty. The patient                            tolerated the procedure. Scope In: Scope Out: Findings:      ENDOSCOPIC FINDING: :      White nummular lesions were noted in the proximal esophagus. Biopsies       were taken with a cold forceps for histology.      LA Grade B (one or more mucosal breaks greater than 5 mm, not extending       between the tops of two mucosal folds) esophagitis with no bleeding was       found in the distal esophagus and GE junction.      A non-obstructing Schatzki ring was found at the gastroesophageal       junction.      The Z-line was irregular and was found 35 cm from the incisors.      A 3 cm hiatal hernia was present.      Patchy mildly erythematous mucosa without bleeding was found in the       entire examined stomach. Biopsies were taken with a cold forceps for       histology and Helicobacter pylori testing.      No gross lesions were noted in the  duodenal bulb, in the first portion       of the duodenum and in the second portion of the duodenum.      A small non-bleeding diverticulum was found in the area of the papilla.      The major papilla was normal and found within the after mentioned       diverticulum (hidden under a hood).      ENDOSONOGRAPHIC FINDING: :      Anechoic lesions suggestive of multiple cysts were identified in the       pancreatic head, genu of the pancreas and pancreatic body. The first       cyst measured 4 mm by 5 mm (in the body). The second cyst measured 6 mm       by 5 mm (in the head). The third cyst measured 4 mm by 3 mm (in the       neck). The fourth cyst measured 4 mm by 5 mm (in the body). There was no       associated mass with any of the cysts.      Pancreatic parenchymal abnormalities were noted in the entire pancreas.       These consisted of diffusely increased echogenicity.      The pancreatic duct had a normal endosonographic appearance in the       pancreatic head (1.6 mm), genu of the pancreas (1.1 mm), body of the       pancreas (1.0 mm) and tail of the pancreas (0.7 mm).      There was no sign of significant endosonographic abnormality in the       common bile duct (2.5 mm) and in the common hepatic duct (4.8 mm). No       stones and ducts of normal caliber were identified.      Moderate hyperechoic material consistent with tumefactive sludge/sludge       balls was visualized endosonographically in the gallbladder.      No malignant-appearing lymph nodes were visualized in the celiac region       (level 20), peripancreatic region and porta hepatis region.  Endosonographic imaging of the ampulla showed no intramural       (subepithelial) lesion.      The celiac region was visualized.      Endosonographic imaging in the visualized portion of the liver showed no       mass. Impression:               EGD Impression:                           - White nummular lesions in esophageal  mucosa.                            Biopsied.                           - LA Grade B esophagitis with no bleeding.                           - Non-obstructing Schatzki ring.                           - Z-line irregular, 35 cm from the incisors.                           - 3 cm hiatal hernia.                           - Erythematous mucosa in the stomach. Biopsied.                           - No gross lesions in the duodenal bulb, in the                            first portion of the duodenum and in the second                            portion of the duodenum.                           - Non-bleeding duodenal diverticulum. Normal major                            papilla found within and hidden under a hood.                           EUS impression:                           - Multiple cystic lesions were seen in the                            pancreatic head, genu of the pancreas and                            pancreatic body. Tissue has not been obtained.  However, the endosonographic appearance is                            suggestive of an intraductal papillary mucinous                            neoplasm.                           - Pancreatic parenchymal abnormalities consisting                            of diffusely increased echogenicity were noted in                            the entire pancreas.                           - The pancreatic duct had a normal endosonographic                            appearance in the pancreatic head, genu of the                            pancreas, body of the pancreas and tail of the                            pancreas.                           - There was no sign of significant pathology in the                            common bile duct and in the common hepatic duct.                           - Hyperechoic material consistent with tumefactive                            sludge/sludge balls was visualized                             endosonographically in the gallbladder.                           - No malignant-appearing lymph nodes were                            visualized in the celiac region (level 20),                            peripancreatic region and porta hepatis region. Moderate Sedation:      Not Applicable - Patient had care per Anesthesia. Recommendation:           - Proceed to scheduled colonoscopy.                           -  Observe patient's clinical course.                           - Await path results.                           - The findings and recommendations were discussed                            with the patient.                           - Based on the patient's history of recurrent                            idiopathic pancreatitis, the findings of                            tumefactive sludge/sludge balls in the gallbladder,                            I think it is reasonable to consider a referral to                            general surgery to consider cholecystectomy. As                            recent data has shown we may be able to decrease                            the risk of recurrent idiopathic pancreatitis by                            25% in those individuals who end up getting their                            gallbladder removed and with the findings of the                            tumefactive sludge I think this gives more reason                            to consider resection. I will discuss this results                            with the patient as well as the patient's primary                            gastroenterologist. Procedure Code(s):        --- Professional ---                           [phone], Esophagogastroduodenoscopy, flexible,  transoral; with endoscopic ultrasound examination                            limited to the esophagus, stomach or duodenum, and                            adjacent structures                            43239, Esophagogastroduodenoscopy, flexible,                            transoral; with biopsy, single or multiple Diagnosis Code(s):        --- Professional ---                           K22.89, Other specified disease of esophagus                           K20.90, Esophagitis, unspecified without bleeding                           K22.2, Esophageal obstruction                           K44.9, Diaphragmatic hernia without obstruction or                            gangrene                           K31.89, Other diseases of stomach and duodenum                           K86.2, Cyst of pancreas                           K86.9, Disease of pancreas, unspecified                           I89.9, Noninfective disorder of lymphatic vessels                            and lymph nodes, unspecified                           K85.90, Acute pancreatitis without necrosis or                            infection, unspecified                           K57.10, Diverticulosis of small intestine without                            perforation or abscess without bleeding  K83.8, Other specified diseases of biliary tract CPT copyright 2022 [employer]. All rights reserved. The codes documented in this report are preliminary and upon coder review may  be revised to meet current compliance requirements. Justice Britain, MD 05/31/2022 1:45:07 PM Number of Addenda: 0

## 2022-05-31 NOTE — Op Note (Signed)
[facility] Patient Name: Dean Glass Procedure Date: 05/31/2022 MRN: [id number] Attending MD: Justice Britain , MD, [id number] Date of Birth: [birth year] CSN: [id number] Age: 46 Admit Type: Outpatient Procedure:                Colonoscopy Indications:              Screening for colorectal malignant neoplasm Providers:                Justice Britain, MD, Burtis Junes, RN, Jeanella Cara, RN, William Dalton, Technician Referring MD:             Lajuan Lines. Hilarie Fredrickson, MD, Evie Lacks. Plotnikov MD, MD,                            Vicie Mutters Medicines:                Monitored Anesthesia Care Complications:            No immediate complications. Estimated Blood Loss:     Estimated blood loss was minimal. Procedure:                Pre-Anesthesia Assessment:                           - Prior to the procedure, a History and Physical                            was performed, and patient medications and                            allergies were reviewed. The patient's tolerance of                            previous anesthesia was also reviewed. The risks                            and benefits of the procedure and the sedation                            options and risks were discussed with the patient.                            All questions were answered, and informed consent                            was obtained. Prior Anticoagulants: The patient has                            taken no anticoagulant or antiplatelet agents                            except for aspirin and has taken no anticoagulant  or antiplatelet agents except for NSAID medication.                            ASA Grade Assessment: II - A patient with mild                            systemic disease. After reviewing the risks and                            benefits, the patient was deemed in satisfactory                            condition to  undergo the procedure.                           After obtaining informed consent, the colonoscope                            was passed under direct vision. Throughout the                            procedure, the patient's blood pressure, pulse, and                            oxygen saturations were monitored continuously. The                            CF-HQ190L ([id number]) Olympus colonoscope was                            introduced through the anus and advanced to the 4                            cm into the ileum. The colonoscopy was performed                            without difficulty. The patient tolerated the                            procedure. The quality of the bowel preparation was                            adequate. The terminal ileum, ileocecal valve,                            appendiceal orifice, and rectum were photographed. Scope In: 1:02:57 PM Scope Out: 1:19:01 PM Scope Withdrawal Time: 0 hours 13 minutes 1 second  Total Procedure Duration: 0 hours 16 minutes 4 seconds  Findings:      The digital rectal exam findings include hemorrhoids. Pertinent       negatives include no palpable rectal lesions.      The terminal ileum and ileocecal valve appeared normal.      Many small-mouthed diverticula were found in the entire colon.  Normal mucosa was found in the entire colon.      Non-bleeding non-thrombosed internal hemorrhoids were found during       retroflexion, during perianal exam and during digital exam. The       hemorrhoids were Grade II (internal hemorrhoids that prolapse but reduce       spontaneously). Impression:               - Hemorrhoids found on digital rectal exam.                           - The examined portion of the ileum was normal.                           - Diverticulosis in the entire examined colon.                           - Normal mucosa in the entire examined colon.                           - Non-bleeding non-thrombosed internal  hemorrhoids. Moderate Sedation:      Not Applicable - Patient had care per Anesthesia. Recommendation:           - The patient will be observed post-procedure,                            until all discharge criteria are met.                           - Discharge patient to home.                           - Patient has a contact number available for                            emergencies. The signs and symptoms of potential                            delayed complications were discussed with the                            patient. Return to normal activities tomorrow.                            Written discharge instructions were provided to the                            patient.                           - High fiber diet.                           - Use FiberCon 1-2 tablets PO daily.                           - Continue present medications.                           -  Repeat colonoscopy in 10 years for screening                            purposes.                           - The findings and recommendations were discussed                            with the patient.                           - The findings and recommendations were discussed                            with the patient's family. Procedure Code(s):        --- Professional ---                           [phone], Colonoscopy, flexible; diagnostic, including                            collection of specimen(s) by brushing or washing,                            when performed (separate procedure) Diagnosis Code(s):        --- Professional ---                           Z12.11, Encounter for screening for malignant                            neoplasm of colon                           K64.1, Second degree hemorrhoids                           K57.30, Diverticulosis of large intestine without                            perforation or abscess without bleeding CPT copyright 2022 American [employer] Association. All rights  reserved. The codes documented in this report are preliminary and upon coder review may  be revised to meet current compliance requirements. Justice Britain, MD 05/31/2022 1:33:46 PM Number of Addenda: 0

## 2022-06-01 LAB — SURGICAL PATHOLOGY

## 2022-06-03 ENCOUNTER — Encounter (HOSPITAL_COMMUNITY): Payer: Self-pay | Admitting: Gastroenterology

## 2022-06-03 ENCOUNTER — Encounter: Payer: Self-pay | Admitting: Gastroenterology

## 2022-06-09 ENCOUNTER — Other Ambulatory Visit: Payer: Self-pay

## 2022-06-10 ENCOUNTER — Other Ambulatory Visit: Payer: Self-pay

## 2022-07-02 DIAGNOSIS — D490 Neoplasm of unspecified behavior of digestive system: Secondary | ICD-10-CM | POA: Insufficient documentation

## 2022-07-02 DIAGNOSIS — K828 Other specified diseases of gallbladder: Secondary | ICD-10-CM | POA: Insufficient documentation

## 2022-07-02 DIAGNOSIS — K8681 Exocrine pancreatic insufficiency: Secondary | ICD-10-CM | POA: Insufficient documentation

## 2022-07-05 ENCOUNTER — Telehealth: Payer: 59 | Admitting: Emergency Medicine

## 2022-07-05 ENCOUNTER — Other Ambulatory Visit: Payer: Self-pay

## 2022-07-05 DIAGNOSIS — J329 Chronic sinusitis, unspecified: Secondary | ICD-10-CM | POA: Diagnosis not present

## 2022-07-05 MED ORDER — IPRATROPIUM BROMIDE 0.03 % NA SOLN
2.0000 | Freq: Two times a day (BID) | NASAL | 0 refills | Status: DC
  Filled 2022-07-05: qty 30, 30d supply, fill #0

## 2022-07-05 NOTE — Progress Notes (Signed)
E-Visit for Sinus Problems  We are sorry that you are not feeling well.  Here is how we plan to help!  Based on what you have shared with me it looks like you have sinusitis.  Sinusitis is inflammation and infection in the sinus cavities of the head.  Based on your presentation I believe you most likely have Acute Viral Sinusitis.This is an infection most likely caused by a virus. There is not specific treatment for viral sinusitis other than to help you with the symptoms until the infection runs its course.  You may use an oral decongestant such as Mucinex D or if you have glaucoma or high blood pressure use plain Mucinex. Saline nasal spray help and can safely be used as often as needed for congestion, I have prescribed: Ipratropium Bromide nasal spray 0.03% 2 sprays in eah nostril 2-3 times a day  Some authorities believe that zinc sprays or the use of Echinacea may shorten the course of your symptoms.  Sinus infections are not as easily transmitted as other respiratory infection, however we still recommend that you avoid close contact with loved ones, especially the very young and elderly.  Remember to wash your hands thoroughly throughout the day as this is the number one way to prevent the spread of infection!  Home Care: Only take medications as instructed by your medical team. Do not take these medications with alcohol. A steam or ultrasonic humidifier can help congestion.  You can place a towel over your head and breathe in the steam from hot water coming from a faucet. Avoid close contacts especially the very young and the elderly. Cover your mouth when you cough or sneeze. Always remember to wash your hands.  Get Help Right Away If: You develop worsening fever or sinus pain. You develop a severe head ache or visual changes. Your symptoms persist after you have completed your treatment plan.  Make sure you Understand these instructions. Will watch your condition. Will get help  right away if you are not doing well or get worse.   Thank you for choosing an e-visit.  Your e-visit answers were reviewed by a board certified advanced clinical practitioner to complete your personal care plan. Depending upon the condition, your plan could have included both over the counter or prescription medications.  Please review your pharmacy choice. Make sure the pharmacy is open so you can pick up prescription now. If there is a problem, you may contact your provider through [employer] and have the prescription routed to another pharmacy.  Your safety is important to [place]. If you have drug allergies check your prescription carefully.   For the next 24 hours you can use MyChart to ask questions about today's visit, request a non-urgent call back, or ask for a work or school excuse. You will get an email in the next two days asking about your experience. I hope that your e-visit has been valuable and will speed your recovery.  Approximately 5 minutes was used in reviewing the patient's chart, questionnaire, prescribing medications, and documentation.

## 2022-07-07 ENCOUNTER — Other Ambulatory Visit: Payer: Self-pay

## 2022-07-08 ENCOUNTER — Other Ambulatory Visit: Payer: Self-pay

## 2022-07-14 ENCOUNTER — Other Ambulatory Visit: Payer: Self-pay

## 2022-07-16 ENCOUNTER — Other Ambulatory Visit: Payer: Self-pay

## 2022-07-16 ENCOUNTER — Ambulatory Visit (INDEPENDENT_AMBULATORY_CARE_PROVIDER_SITE_OTHER): Payer: 59 | Admitting: Sports Medicine

## 2022-07-16 ENCOUNTER — Ambulatory Visit (INDEPENDENT_AMBULATORY_CARE_PROVIDER_SITE_OTHER): Payer: 59

## 2022-07-16 VITALS — BP 122/84 | HR 89 | Ht 67.0 in | Wt 193.0 lb

## 2022-07-16 DIAGNOSIS — S86112A Strain of other muscle(s) and tendon(s) of posterior muscle group at lower leg level, left leg, initial encounter: Secondary | ICD-10-CM | POA: Diagnosis not present

## 2022-07-16 DIAGNOSIS — M25572 Pain in left ankle and joints of left foot: Secondary | ICD-10-CM | POA: Diagnosis not present

## 2022-07-16 MED ORDER — MELOXICAM 15 MG PO TABS
15.0000 mg | ORAL_TABLET | Freq: Every day | ORAL | 0 refills | Status: DC
  Filled 2022-07-16: qty 30, 30d supply, fill #0

## 2022-07-16 NOTE — Patient Instructions (Addendum)
Good to see you  - Start meloxicam 15 mg daily x2 weeks.  If still having pain after 2 weeks, complete 3rd-week of meloxicam. May use remaining meloxicam as needed once daily for pain control.  Do not to use additional NSAIDs while taking meloxicam.  May use Tylenol [phone] mg 2 to 3 times a day for breakthrough pain. Recommend getting a lace up ankle brace Ankle HEP  3-4 week follow up

## 2022-07-16 NOTE — Progress Notes (Signed)
Dean Glass Phone: [phone]   Assessment and Plan:     1. Acute left ankle pain 2. Left posterior tibial strain, initial encounter -Acute, initial sports medicine visit - Unclear etiology of acute medial left ankle pain.  Most consistent with posterior tibial strain likely occurring while patient was cleaning out a chimney on a roof, though no one specific MOI.  Differential includes strain of deltoid ligament versus strain of old medial malleoli cortical Injury - Start meloxicam 15 mg daily x2 weeks.  If still having pain after 2 weeks, complete 3rd-week of meloxicam. May use remaining meloxicam as needed once daily for pain control.  Do not to use additional NSAIDs while taking meloxicam.  May use Tylenol [phone] mg 2 to 3 times a day for breakthrough pain. - Recommend getting a lace up ankle brace to use when ambulatory for the next 2 weeks and then may gradually discontinue as tolerated - Start HEP for ankle - X-ray obtained in clinic.  My interpretation: No acute fracture or dislocation.  Cortical changes at medial malleolus and lateral malleolus that appear chronic in nature based on round shape and no effusion  Other orders - meloxicam (MOBIC) 15 MG tablet; Take 1 tablet (15 mg total) by mouth daily.    Pertinent previous records reviewed include none   Follow Up: 3 to 4 weeks for reevaluation.  If no improvement or worsening of symptoms, could perform ultrasound and consider CSI versus boot versus PT   Subjective:   I, Dean Glass, am serving as a Education administrator for Dean Glass  Chief Complaint: left ankle pain  HPI:   07/16/22 Patient is a 47 year old male complaining of left ankle pain. Patient states medial ankle pain, antalgic altered gait , no MOI, started about 2 days ago , ib for the pain and that barley touched, no numbness or tingling, stiff feeling no radiating pain  stays in the ankle,   Relevant Historical Information: History of pancreatitis  Additional pertinent review of systems negative.   Current Outpatient Medications:    aspirin EC 81 MG tablet, Take 81 mg by mouth daily. Swallow whole., Disp: , Rfl:    Blood Glucose Monitoring Suppl (ONETOUCH VERIO) w/Device KIT, 1 Units by Does not apply route daily as needed., Disp: 1 kit, Rfl: 1   Continuous Blood Gluc Receiver (DEXCOM G7 RECEIVER) DEVI, Use as directed to check blood sugars, Disp: 1 each, Rfl: 0   Continuous Blood Gluc Sensor (DEXCOM G7 SENSOR) MISC, Use as directed to check blood sugars, Disp: 9 each, Rfl: 3   glucose blood (ONETOUCH VERIO) test strip, 1 each by Other route daily., Disp: 100 strip, Rfl: 3   ibuprofen (ADVIL) 200 MG tablet, Take 400 mg by mouth every 6 (six) hours as needed for moderate pain., Disp: , Rfl:    Lancets (ONETOUCH ULTRASOFT) lancets, Use as instructed, Disp: 100 each, Rfl: 5   Liniments (BLUE-EMU SUPER STRENGTH EX), Apply 1 Application topically 2 (two) times daily as needed (plantar fasciitis)., Disp: , Rfl:    lipase/protease/amylase (CREON) 12000-38000 units CPEP capsule, Take 1 capsule (12,000 Units total) by mouth 3 (three) times daily with meals., Disp: 270 capsule, Rfl: 3   meloxicam (MOBIC) 15 MG tablet, Take 1 tablet (15 mg total) by mouth daily., Disp: 30 tablet, Rfl: 0   ipratropium (ATROVENT) 0.03 % nasal spray, Place 2 sprays into both nostrils every 12 (twelve)  hours., Disp: 30 mL, Rfl: 0   Objective:     Vitals:   07/16/22 1316  BP: 122/84  Pulse: 89  SpO2: 99%  Weight: 193 lb (87.5 kg)  Height: '5\' 7"'$  (1.702 m)      Body mass index is 30.23 kg/m.    Physical Exam:    Gen: Appears well, nad, nontoxic and pleasant Psych: Alert and oriented, appropriate mood and affect Neuro: sensation intact, strength is 5/5 with df/pf/inv/ev, muscle tone wnl Skin: no susupicious lesions or rashes  Left ankle: no deformity, no swelling or  effusion TTP medial malleolus, posterior to medial malleolus, deltoid ligament NTTP over fibular head, lat mal, achilles, navicular, base of 5th, ATFL, CFL,  , calcaneous or midfoot ROM DF 30, PF 45, inv/ev intact Negative ant drawer, talar tilt, rotation test, squeeze test. Neg thompson Mild pain with resisted dorsiflexion and inversion   Electronically signed by:  Dean Glass Sports [facility] 1:59 PM 07/16/22

## 2022-07-20 ENCOUNTER — Other Ambulatory Visit: Payer: Self-pay

## 2022-08-04 ENCOUNTER — Other Ambulatory Visit: Payer: Self-pay

## 2022-08-06 ENCOUNTER — Ambulatory Visit: Payer: 59 | Admitting: Sports Medicine

## 2022-08-24 ENCOUNTER — Encounter: Payer: Self-pay | Admitting: Internal Medicine

## 2022-09-02 ENCOUNTER — Ambulatory Visit: Payer: 59 | Admitting: Internal Medicine

## 2022-09-06 ENCOUNTER — Other Ambulatory Visit: Payer: Self-pay

## 2022-09-07 ENCOUNTER — Other Ambulatory Visit: Payer: Self-pay

## 2022-09-21 ENCOUNTER — Other Ambulatory Visit: Payer: Self-pay

## 2022-09-21 ENCOUNTER — Ambulatory Visit (INDEPENDENT_AMBULATORY_CARE_PROVIDER_SITE_OTHER): Payer: 59 | Admitting: Internal Medicine

## 2022-09-21 ENCOUNTER — Encounter: Payer: Self-pay | Admitting: Internal Medicine

## 2022-09-21 VITALS — BP 118/78 | HR 100 | Temp 97.9°F | Ht 67.0 in | Wt 204.0 lb

## 2022-09-21 DIAGNOSIS — Z0000 Encounter for general adult medical examination without abnormal findings: Secondary | ICD-10-CM

## 2022-09-21 DIAGNOSIS — I2510 Atherosclerotic heart disease of native coronary artery without angina pectoris: Secondary | ICD-10-CM

## 2022-09-21 DIAGNOSIS — E1169 Type 2 diabetes mellitus with other specified complication: Secondary | ICD-10-CM

## 2022-09-21 DIAGNOSIS — K8500 Idiopathic acute pancreatitis without necrosis or infection: Secondary | ICD-10-CM

## 2022-09-21 DIAGNOSIS — I2583 Coronary atherosclerosis due to lipid rich plaque: Secondary | ICD-10-CM

## 2022-09-21 DIAGNOSIS — E669 Obesity, unspecified: Secondary | ICD-10-CM

## 2022-09-21 DIAGNOSIS — M722 Plantar fascial fibromatosis: Secondary | ICD-10-CM

## 2022-09-21 MED ORDER — TRAMADOL HCL 50 MG PO TABS
50.0000 mg | ORAL_TABLET | Freq: Four times a day (QID) | ORAL | 1 refills | Status: AC | PRN
  Filled 2022-09-21: qty 20, 5d supply, fill #0
  Filled 2023-01-23: qty 20, 5d supply, fill #1

## 2022-09-21 MED ORDER — NABUMETONE 500 MG PO TABS
500.0000 mg | ORAL_TABLET | Freq: Two times a day (BID) | ORAL | 1 refills | Status: DC | PRN
  Filled 2022-09-21: qty 60, 30d supply, fill #0

## 2022-09-21 NOTE — Progress Notes (Signed)
Subjective:  Patient ID: Dean Glass, male    DOB: [birth year]  Age: 47 y.o. MRN: [id number]  CC: Follow-up (4 mnth f/u)   HPI Dean ServeKeith A Glass presents for recurrent pancreatitis, diabetes, plantar fasciitis  Outpatient Medications Prior to Visit  Medication Sig Dispense Refill   aspirin EC 81 MG tablet Take 81 mg by mouth daily. Swallow whole.     Blood Glucose Monitoring Suppl (ONETOUCH VERIO) w/Device KIT 1 Units by Does not apply route daily as needed. 1 kit 1   Continuous Blood Gluc Receiver (DEXCOM G7 RECEIVER) DEVI Use as directed to check blood sugars 1 each 0   Continuous Blood Gluc Sensor (DEXCOM G7 SENSOR) MISC Use as directed to check blood sugars 9 each 3   glucose blood (ONETOUCH VERIO) test strip 1 each by Other route daily. 100 strip 3   ibuprofen (ADVIL) 200 MG tablet Take 400 mg by mouth every 6 (six) hours as needed for moderate pain.     Lancets (ONETOUCH ULTRASOFT) lancets Use as instructed 100 each 5   Liniments (BLUE-EMU SUPER STRENGTH EX) Apply 1 Application topically 2 (two) times daily as needed (plantar fasciitis).     lipase/protease/amylase (CREON) 12000-38000 units CPEP capsule Take 1 capsule (12,000 Units total) by mouth 3 (three) times daily with meals. 270 capsule 3   meloxicam (MOBIC) 15 MG tablet Take 1 tablet (15 mg total) by mouth daily. 30 tablet 0   ipratropium (ATROVENT) 0.03 % nasal spray Place 2 sprays into both nostrils every 12 (twelve) hours. 30 mL 0   No facility-administered medications prior to visit.    ROS: Review of Systems  Constitutional:  Positive for unexpected weight change. Negative for appetite change and fatigue.  HENT:  Negative for congestion, nosebleeds, sneezing, sore throat and trouble swallowing.   Eyes:  Negative for itching and visual disturbance.  Respiratory:  Negative for cough.   Cardiovascular:  Negative for chest pain, palpitations and leg swelling.  Gastrointestinal:  Negative for abdominal  distention, blood in stool, diarrhea and nausea.  Genitourinary:  Negative for frequency and hematuria.  Musculoskeletal:  Positive for arthralgias and gait problem. Negative for back pain, joint swelling and neck pain.  Skin:  Negative for rash.  Neurological:  Negative for dizziness, tremors, speech difficulty and weakness.  Psychiatric/Behavioral:  Negative for agitation, dysphoric mood, sleep disturbance and suicidal ideas. The patient is not nervous/anxious.     Objective:  BP 118/78 (BP Location: Right Arm, Patient Position: Sitting, Cuff Size: Normal)   Pulse 100   Temp 97.9 F (36.6 C) (Oral)   Ht 5\' 7"  (1.702 m)   Wt 204 lb (92.5 kg)   SpO2 97%   BMI 31.95 kg/m   BP Readings from Last 3 Encounters:  09/21/22 118/78  07/16/22 122/84  05/31/22 (!) 136/93    Wt Readings from Last 3 Encounters:  09/21/22 204 lb (92.5 kg)  07/16/22 193 lb (87.5 kg)  05/31/22 194 lb (88 kg)    Physical Exam Constitutional:      General: He is not in acute distress.    Appearance: He is well-developed. He is obese.     Comments: NAD  Eyes:     Conjunctiva/sclera: Conjunctivae normal.     Pupils: Pupils are equal, round, and reactive to light.  Neck:     Thyroid: No thyromegaly.     Vascular: No JVD.  Cardiovascular:     Rate and Rhythm: Normal rate and regular rhythm.  Heart sounds: Normal heart sounds. No murmur heard.    No friction rub. No gallop.  Pulmonary:     Effort: Pulmonary effort is normal. No respiratory distress.     Breath sounds: Normal breath sounds. No wheezing or rales.  Chest:     Chest wall: No tenderness.  Abdominal:     General: Bowel sounds are normal. There is no distension.     Palpations: Abdomen is soft. There is no mass.     Tenderness: There is no abdominal tenderness. There is no guarding or rebound.  Musculoskeletal:        General: No tenderness. Normal range of motion.     Cervical back: Normal range of motion.     Right lower leg: No  edema.     Left lower leg: No edema.  Lymphadenopathy:     Cervical: No cervical adenopathy.  Skin:    General: Skin is warm and dry.     Findings: No rash.  Neurological:     Mental Status: He is alert and oriented to person, place, and time.     Cranial Nerves: No cranial nerve deficit.     Motor: No abnormal muscle tone.     Coordination: Coordination normal.     Gait: Gait normal.     Deep Tendon Reflexes: Reflexes are normal and symmetric.  Psychiatric:        Behavior: Behavior normal.        Thought Content: Thought content normal.        Judgment: Judgment normal.     Lab Results  Component Value Date   WBC 6.6 04/08/2022   HGB 14.2 04/08/2022   HCT 42.0 04/08/2022   PLT 263.0 04/08/2022   GLUCOSE 106 (H) 04/08/2022   CHOL 201 (H) 10/19/2021   TRIG 74 01/25/2022   HDL 39.70 10/19/2021   LDLDIRECT 124.0 10/19/2021   LDLCALC 74 03/31/2021   ALT 22 04/08/2022   AST 15 04/08/2022   NA 137 04/08/2022   K 4.6 04/08/2022   CL 103 04/08/2022   CREATININE 0.91 04/08/2022   BUN 18 04/08/2022   CO2 27 04/08/2022   TSH 1.91 10/19/2021   PSA 1.11 10/19/2021   HGBA1C 6.8 (H) 10/19/2021    No results found.  Assessment & Plan:   Problem List Items Addressed This Visit       Cardiovascular and Mediastinum   Coronary atherosclerosis    Statin intolerant due to recurrent pancreatitis, possibly statin induced Follow-up with cardiology        Digestive   Acute pancreatitis    Recurrent pancreatitis, possibly statin or metformin induced Follow-up with GI [facility], low-fat diet        Endocrine   Diabetes - Primary   Relevant Orders   Lipid panel   Microalbumin / creatinine urine ratio   Comprehensive metabolic panel   Hemoglobin A1c   CBC with Differential/Platelet   TSH   PSA     Musculoskeletal and Integument   Plantar fasciitis    Chronic [place] shoes, Use arch support inserts and other shoes Blue-Emu cream was recommended to use 2-3 times a  day        Other   Obesity (BMI 30-39.9)    Cut back on carbs.  Lose weight      Well adult exam   Relevant Orders   Lipid panel   Microalbumin / creatinine urine ratio   Comprehensive metabolic panel   CBC with Differential/Platelet  TSH   PSA      Meds ordered this encounter  Medications   nabumetone (RELAFEN) 500 MG tablet    Sig: Take 1 tablet (500 mg total) by mouth 2 (two) times daily as needed for moderate pain.    Dispense:  60 tablet    Refill:  1   traMADol (ULTRAM) 50 MG tablet    Sig: Take 1 tablet (50 mg total) by mouth every 6 (six) hours as needed for up to 10 days.    Dispense:  20 tablet    Refill:  1      Follow-up: Return in about 3 months (around 12/22/2022) for Wellness Exam.  Sonda PrimesAlex Meara Wiechman, MD

## 2022-09-21 NOTE — Assessment & Plan Note (Addendum)
Chronic [place] shoes, Use arch support inserts and other shoes Blue-Emu cream was recommended to use 2-3 times a day

## 2022-09-27 ENCOUNTER — Other Ambulatory Visit: Payer: Self-pay

## 2022-09-29 ENCOUNTER — Other Ambulatory Visit: Payer: Self-pay

## 2022-10-01 ENCOUNTER — Other Ambulatory Visit: Payer: Self-pay

## 2022-10-05 NOTE — Assessment & Plan Note (Signed)
Recurrent pancreatitis, possibly statin or metformin induced Follow-up with GI [facility], low-fat diet

## 2022-10-05 NOTE — Assessment & Plan Note (Signed)
Statin intolerant due to recurrent pancreatitis, possibly statin induced Follow-up with cardiology

## 2022-10-05 NOTE — Assessment & Plan Note (Signed)
Cut back on carbs.  Lose weight

## 2022-10-06 ENCOUNTER — Other Ambulatory Visit: Payer: Self-pay

## 2022-10-29 ENCOUNTER — Other Ambulatory Visit: Payer: Self-pay

## 2022-11-30 ENCOUNTER — Other Ambulatory Visit: Payer: Self-pay

## 2022-12-01 ENCOUNTER — Other Ambulatory Visit (INDEPENDENT_AMBULATORY_CARE_PROVIDER_SITE_OTHER): Payer: 59

## 2022-12-01 DIAGNOSIS — Z0000 Encounter for general adult medical examination without abnormal findings: Secondary | ICD-10-CM | POA: Diagnosis not present

## 2022-12-01 DIAGNOSIS — E1169 Type 2 diabetes mellitus with other specified complication: Secondary | ICD-10-CM

## 2022-12-01 DIAGNOSIS — Z13220 Encounter for screening for lipoid disorders: Secondary | ICD-10-CM | POA: Diagnosis not present

## 2022-12-01 LAB — CBC WITH DIFFERENTIAL/PLATELET
Basophils Absolute: 0 10*3/uL (ref 0.0–0.1)
Basophils Relative: 0.8 % (ref 0.0–3.0)
Eosinophils Absolute: 0.1 10*3/uL (ref 0.0–0.7)
Eosinophils Relative: 1.6 % (ref 0.0–5.0)
HCT: 43.2 % (ref 39.0–52.0)
Hemoglobin: 14.6 g/dL (ref 13.0–17.0)
Lymphocytes Relative: 43.2 % (ref 12.0–46.0)
Lymphs Abs: 2.8 10*3/uL (ref 0.7–4.0)
MCHC: 33.7 g/dL (ref 30.0–36.0)
MCV: 88.9 fl (ref 78.0–100.0)
Monocytes Absolute: 0.6 10*3/uL (ref 0.1–1.0)
Monocytes Relative: 9.1 % (ref 3.0–12.0)
Neutro Abs: 2.9 10*3/uL (ref 1.4–7.7)
Neutrophils Relative %: 45.3 % (ref 43.0–77.0)
Platelets: 283 10*3/uL (ref 150.0–400.0)
RBC: 4.86 Mil/uL (ref 4.22–5.81)
RDW: 12.8 % (ref 11.5–15.5)
WBC: 6.4 10*3/uL (ref 4.0–10.5)

## 2022-12-01 LAB — COMPREHENSIVE METABOLIC PANEL
ALT: 25 U/L (ref 0–53)
AST: 18 U/L (ref 0–37)
Albumin: 4.3 g/dL (ref 3.5–5.2)
Alkaline Phosphatase: 51 U/L (ref 39–117)
BUN: 13 mg/dL (ref 6–23)
CO2: 22 mEq/L (ref 19–32)
Calcium: 9 mg/dL (ref 8.4–10.5)
Chloride: 104 mEq/L (ref 96–112)
Creatinine, Ser: 0.9 mg/dL (ref 0.40–1.50)
GFR: 101.74 mL/min (ref >60.00)
Glucose, Bld: 127 mg/dL — ABNORMAL HIGH (ref 70–99)
Potassium: 4.7 mEq/L (ref 3.5–5.1)
Sodium: 139 mEq/L (ref 135–145)
Total Bilirubin: 0.6 mg/dL (ref 0.2–1.2)
Total Protein: 6.9 g/dL (ref 6.0–8.3)

## 2022-12-01 LAB — TSH: TSH: 1.69 u[IU]/mL (ref 0.35–5.50)

## 2022-12-01 LAB — LIPID PANEL
Cholesterol: 185 mg/dL (ref 0–200)
HDL: 40 mg/dL (ref >39.00)
LDL Cholesterol: 121 mg/dL — ABNORMAL HIGH (ref 0–99)
NonHDL: 144.83
Total CHOL/HDL Ratio: 5
Triglycerides: 118 mg/dL (ref 0.0–149.0)
VLDL: 23.6 mg/dL (ref 0.0–40.0)

## 2022-12-01 LAB — HEMOGLOBIN A1C: Hgb A1c MFr Bld: 6.7 % — ABNORMAL HIGH (ref 4.6–6.5)

## 2022-12-01 LAB — MICROALBUMIN / CREATININE URINE RATIO
Creatinine,U: 211.8 mg/dL
Microalb Creat Ratio: 0.4 mg/g (ref 0.0–30.0)
Microalb, Ur: 0.8 mg/dL (ref 0.0–1.9)

## 2022-12-01 LAB — PSA: PSA: 1.31 ng/mL (ref 0.10–4.00)

## 2022-12-02 ENCOUNTER — Other Ambulatory Visit: Payer: Self-pay

## 2022-12-02 ENCOUNTER — Ambulatory Visit (INDEPENDENT_AMBULATORY_CARE_PROVIDER_SITE_OTHER): Payer: 59 | Admitting: Internal Medicine

## 2022-12-02 ENCOUNTER — Encounter: Payer: Self-pay | Admitting: Internal Medicine

## 2022-12-02 VITALS — BP 118/80 | HR 85 | Temp 98.2°F | Ht 67.0 in | Wt 206.0 lb

## 2022-12-02 DIAGNOSIS — Z23 Encounter for immunization: Secondary | ICD-10-CM | POA: Diagnosis not present

## 2022-12-02 DIAGNOSIS — E1169 Type 2 diabetes mellitus with other specified complication: Secondary | ICD-10-CM

## 2022-12-02 DIAGNOSIS — M722 Plantar fascial fibromatosis: Secondary | ICD-10-CM

## 2022-12-02 DIAGNOSIS — Z0000 Encounter for general adult medical examination without abnormal findings: Secondary | ICD-10-CM

## 2022-12-02 DIAGNOSIS — E785 Hyperlipidemia, unspecified: Secondary | ICD-10-CM

## 2022-12-02 MED ORDER — ICOSAPENT ETHYL 1 G PO CAPS
2.0000 g | ORAL_CAPSULE | Freq: Two times a day (BID) | ORAL | 3 refills | Status: DC
  Filled 2022-12-02: qty 120, 30d supply, fill #0
  Filled 2023-08-14: qty 120, 30d supply, fill #1
  Filled 2023-09-15: qty 120, 30d supply, fill #2
  Filled 2023-10-11 – 2023-11-06 (×2): qty 120, 30d supply, fill #3

## 2022-12-02 NOTE — Assessment & Plan Note (Signed)
On diet A1c is god

## 2022-12-02 NOTE — Assessment & Plan Note (Signed)
vaccinations. Labs were ordered to be later reviewed . All questions were answered. We discussed one or more of the following - seat belt use, use of sunscreen/sun exposure exercise, fall risk reduction, second hand smoke exposure, firearm use and storage, seat belt use, a need for adhering to healthy diet and exercise. Labs were ordered.  All questions were answered. Colon pending 2023 Tesicular self exam Prevnar - advised tDAP due in 2024 2022 Coronary calcium CT score of 2.

## 2022-12-02 NOTE — Assessment & Plan Note (Signed)
Use arch support inserts and other shoes Blue-Emu cream was recommended to use 2-3 times a day  Chronic problem

## 2022-12-02 NOTE — Progress Notes (Signed)
Subjective:  Patient ID: Dean Glass, male    DOB: September 23, 1975  Age: 47 y.o. MRN: [id number]  CC: Annual Exam (PHYSICAL)   HPI Dean Glass presents for a well exam  Outpatient Medications Prior to Visit  Medication Sig Dispense Refill   aspirin EC 81 MG tablet Take 81 mg by mouth daily. Swallow whole.     Blood Glucose Monitoring Suppl (ONETOUCH VERIO) w/Device KIT 1 Units by Does not apply route daily as needed. 1 kit 1   Continuous Blood Gluc Receiver (DEXCOM G7 RECEIVER) DEVI Use as directed to check blood sugars 1 each 0   Continuous Blood Gluc Sensor (DEXCOM G7 SENSOR) MISC Use as directed to check blood sugars 9 each 3   glucose blood (ONETOUCH VERIO) test strip 1 each by Other route daily. 100 strip 3   ibuprofen (ADVIL) 200 MG tablet Take 400 mg by mouth every 6 (six) hours as needed for moderate pain.     Lancets (ONETOUCH ULTRASOFT) lancets Use as instructed 100 each 5   Liniments (BLUE-EMU SUPER STRENGTH EX) Apply 1 Application topically 2 (two) times daily as needed (plantar fasciitis).     lipase/protease/amylase (CREON) 12000-38000 units CPEP capsule Take 1 capsule (12,000 Units total) by mouth 3 (three) times daily with meals. 270 capsule 3   meloxicam (MOBIC) 15 MG tablet Take 1 tablet (15 mg total) by mouth daily. 30 tablet 0   nabumetone (RELAFEN) 500 MG tablet Take 1 tablet (500 mg total) by mouth 2 (two) times daily as needed for moderate pain. 60 tablet 1   No facility-administered medications prior to visit.    ROS: Review of Systems  Constitutional:  Negative for appetite change, fatigue and unexpected weight change.  HENT:  Negative for congestion, nosebleeds, sneezing, sore throat and trouble swallowing.   Eyes:  Negative for itching and visual disturbance.  Respiratory:  Negative for cough.   Cardiovascular:  Negative for chest pain, palpitations and leg swelling.  Gastrointestinal:  Negative for abdominal distention, blood in stool, diarrhea  and nausea.  Genitourinary:  Negative for frequency and hematuria.  Musculoskeletal:  Positive for arthralgias. Negative for back pain, gait problem, joint swelling and neck pain.  Skin:  Negative for rash.  Neurological:  Negative for dizziness, tremors, speech difficulty and weakness.  Psychiatric/Behavioral:  Negative for agitation, dysphoric mood and sleep disturbance. The patient is not nervous/anxious.     Objective:  BP 118/80 (BP Location: Right Arm, Patient Position: Sitting, Cuff Size: Large)   Pulse 85   Temp 98.2 F (36.8 C) (Oral)   Ht 5\' 7"  (1.702 m)   Wt 206 lb (93.4 kg)   SpO2 98%   BMI 32.26 kg/m   BP Readings from Last 3 Encounters:  12/02/22 118/80  09/21/22 118/78  07/16/22 122/84    Wt Readings from Last 3 Encounters:  12/02/22 206 lb (93.4 kg)  09/21/22 204 lb (92.5 kg)  07/16/22 193 lb (87.5 kg)    Physical Exam Constitutional:      General: He is not in acute distress.    Appearance: Normal appearance. He is well-developed.     Comments: NAD  Eyes:     Conjunctiva/sclera: Conjunctivae normal.     Pupils: Pupils are equal, round, and reactive to light.  Neck:     Thyroid: No thyromegaly.     Vascular: No JVD.  Cardiovascular:     Rate and Rhythm: Normal rate and regular rhythm.     Heart sounds: Normal  heart sounds. No murmur heard.    No friction rub. No gallop.  Pulmonary:     Effort: Pulmonary effort is normal. No respiratory distress.     Breath sounds: Normal breath sounds. No wheezing or rales.  Chest:     Chest wall: No tenderness.  Abdominal:     General: Bowel sounds are normal. There is no distension.     Palpations: Abdomen is soft. There is no mass.     Tenderness: There is no abdominal tenderness. There is no guarding or rebound.  Musculoskeletal:        General: No tenderness. Normal range of motion.     Cervical back: Normal range of motion.  Lymphadenopathy:     Cervical: No cervical adenopathy.  Skin:    General:  Skin is warm and dry.     Findings: No rash.  Neurological:     Mental Status: He is alert and oriented to person, place, and time.     Cranial Nerves: No cranial nerve deficit.     Motor: No abnormal muscle tone.     Coordination: Coordination normal.     Gait: Gait normal.     Deep Tendon Reflexes: Reflexes are normal and symmetric.  Psychiatric:        Behavior: Behavior normal.        Thought Content: Thought content normal.        Judgment: Judgment normal.     Lab Results  Component Value Date   WBC 6.4 12/01/2022   HGB 14.6 12/01/2022   HCT 43.2 12/01/2022   PLT 283.0 12/01/2022   GLUCOSE 127 (H) 12/01/2022   CHOL 185 12/01/2022   TRIG 118.0 12/01/2022   HDL 40.00 12/01/2022   LDLDIRECT 124.0 10/19/2021   LDLCALC 121 (H) 12/01/2022   ALT 25 12/01/2022   AST 18 12/01/2022   NA 139 12/01/2022   K 4.7 12/01/2022   CL 104 12/01/2022   CREATININE 0.90 12/01/2022   BUN 13 12/01/2022   CO2 22 12/01/2022   TSH 1.69 12/01/2022   PSA 1.31 12/01/2022   HGBA1C 6.7 (H) 12/01/2022   MICROALBUR 0.8 12/01/2022    No results found.  Assessment & Plan:   Problem List Items Addressed This Visit     Well adult exam - Primary    vaccinations. Labs were ordered to be later reviewed . All questions were answered. We discussed one or more of the following - seat belt use, use of sunscreen/sun exposure exercise, fall risk reduction, second hand smoke exposure, firearm use and storage, seat belt use, a need for adhering to healthy diet and exercise. Labs were ordered.  All questions were answered. Colon pending 2023 Tesicular self exam Prevnar - advised tDAP due in 2024 2022 Coronary calcium CT score of 2.      Dyslipidemia    Start vascepa      Relevant Medications   icosapent Ethyl (VASCEPA) 1 g capsule   Other Relevant Orders   Comprehensive metabolic panel   Hemoglobin A1c   Lipid panel   Diabetes (HCC)    On diet A1c is god      Relevant Orders    Comprehensive metabolic panel   Hemoglobin A1c   Plantar fasciitis    Use arch support inserts and other shoes Blue-Emu cream was recommended to use 2-3 times a day  Chronic problem         Meds ordered this encounter  Medications   icosapent Ethyl (VASCEPA) 1 g  capsule    Sig: Take 2 capsules (2 g total) by mouth 2 (two) times daily.    Dispense:  360 capsule    Refill:  3      Follow-up: Return in about 6 months (around 06/03/2023) for a follow-up visit.  Sonda Primes, MD

## 2022-12-02 NOTE — Addendum Note (Signed)
Addended by: Delsa Grana R on: 12/02/2022 02:07 PM   Modules accepted: Orders

## 2022-12-02 NOTE — Assessment & Plan Note (Signed)
Start vascepa 

## 2022-12-06 ENCOUNTER — Other Ambulatory Visit: Payer: Self-pay

## 2022-12-29 ENCOUNTER — Other Ambulatory Visit: Payer: Self-pay

## 2023-01-24 ENCOUNTER — Other Ambulatory Visit: Payer: Self-pay

## 2023-01-25 ENCOUNTER — Other Ambulatory Visit: Payer: Self-pay

## 2023-03-10 ENCOUNTER — Other Ambulatory Visit: Payer: Self-pay

## 2023-03-23 ENCOUNTER — Other Ambulatory Visit: Payer: Self-pay

## 2023-03-23 ENCOUNTER — Telehealth: Payer: 59 | Admitting: Physician Assistant

## 2023-03-23 DIAGNOSIS — B9689 Other specified bacterial agents as the cause of diseases classified elsewhere: Secondary | ICD-10-CM

## 2023-03-23 DIAGNOSIS — J019 Acute sinusitis, unspecified: Secondary | ICD-10-CM | POA: Diagnosis not present

## 2023-03-23 MED ORDER — AMOXICILLIN-POT CLAVULANATE 875-125 MG PO TABS
1.0000 | ORAL_TABLET | Freq: Two times a day (BID) | ORAL | 0 refills | Status: DC
  Filled 2023-03-23: qty 14, 7d supply, fill #0

## 2023-03-23 NOTE — Progress Notes (Signed)
I have spent 5 minutes in review of e-visit questionnaire, review and updating patient chart, medical decision making and response to patient.   Mia Milan Cody Jacklynn Dehaas, PA-C    

## 2023-03-23 NOTE — Progress Notes (Signed)

## 2023-04-25 IMAGING — US US ABDOMEN LIMITED
1 series · 14 of 25 positions shown · non-contrast
Comparison: CT abdomen pelvis 05/07/2021

CLINICAL DATA: Acute gallstone pancreatitis

EXAM:
ULTRASOUND ABDOMEN LIMITED RIGHT UPPER QUADRANT

[Series 1: us abdomen limited · 14 of 56 slices shown]
[im 1/56]
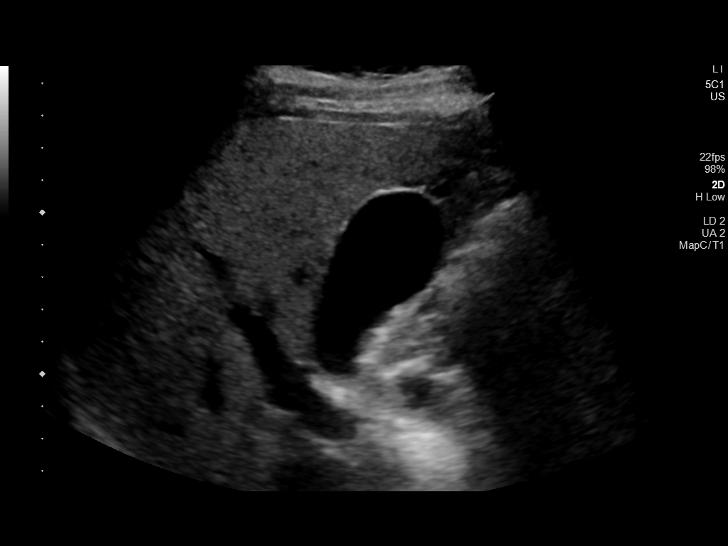
[im 5/56]
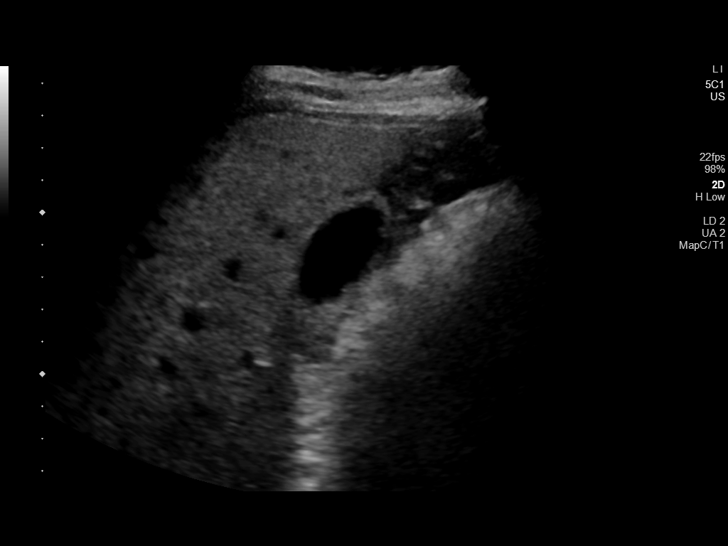
[im 10/56]
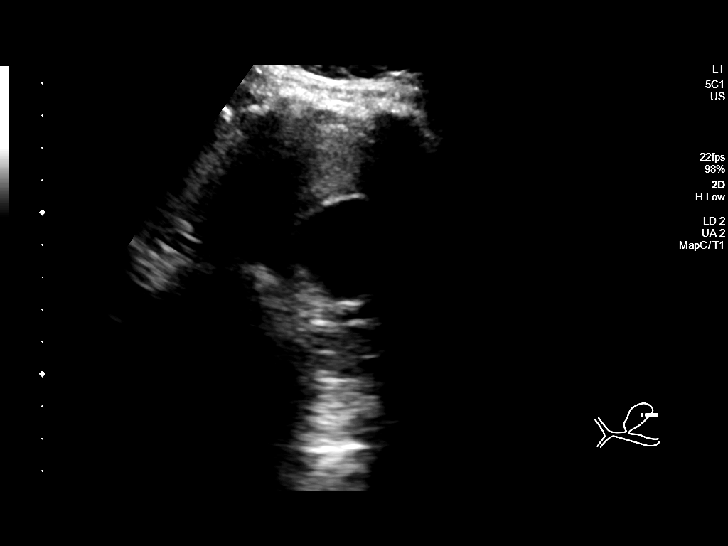
[im 14/56]
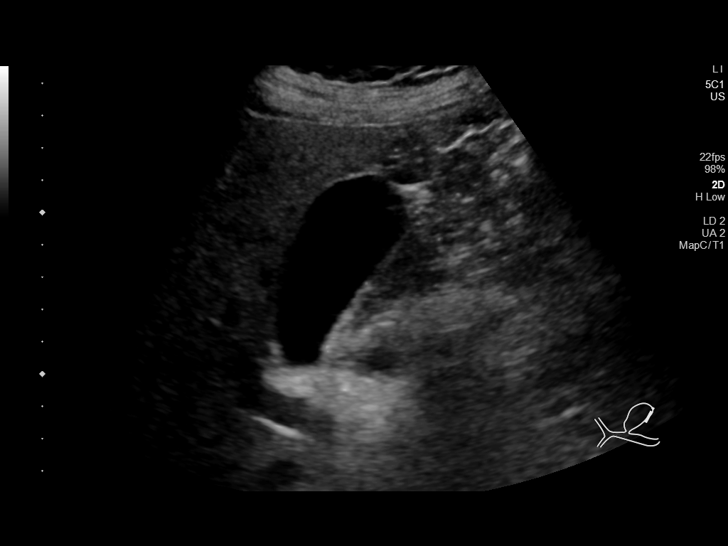
[im 19/56]
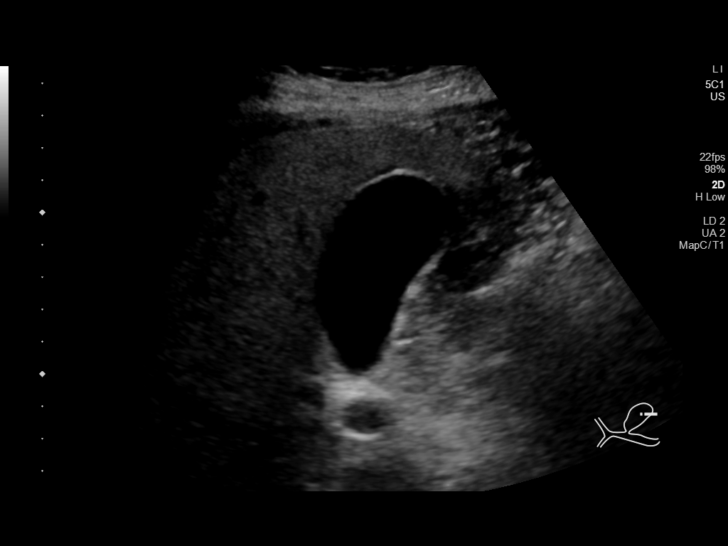
[im 21/56]
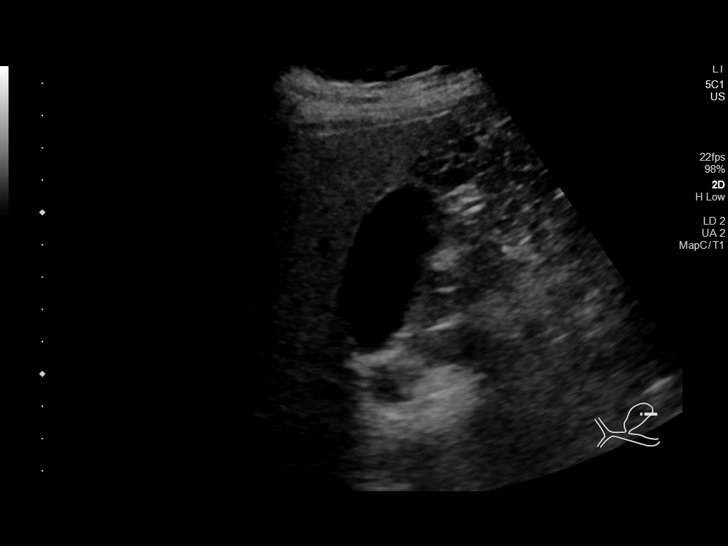
[im 26/56]
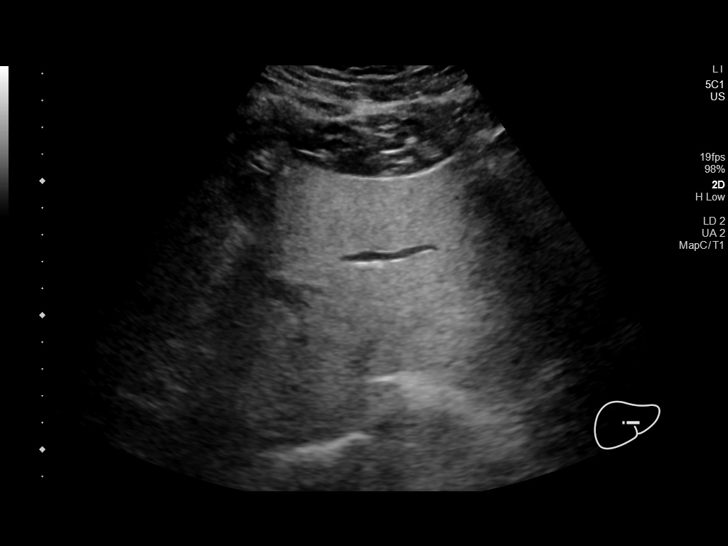
[im 30/56]
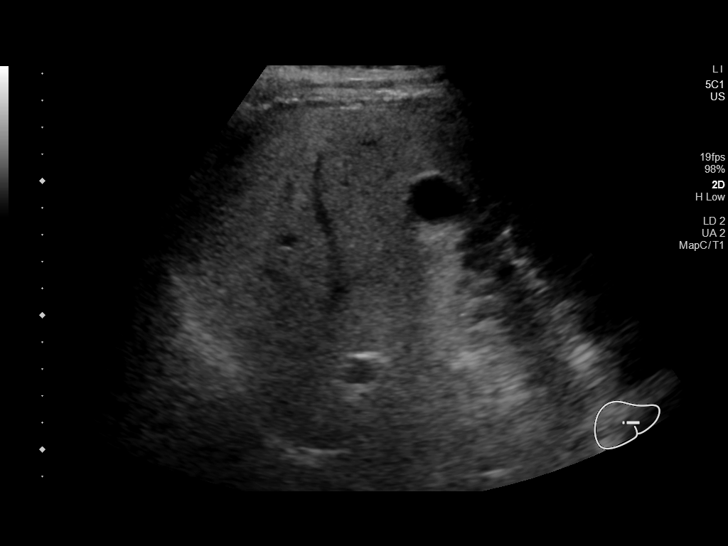
[im 35/56]
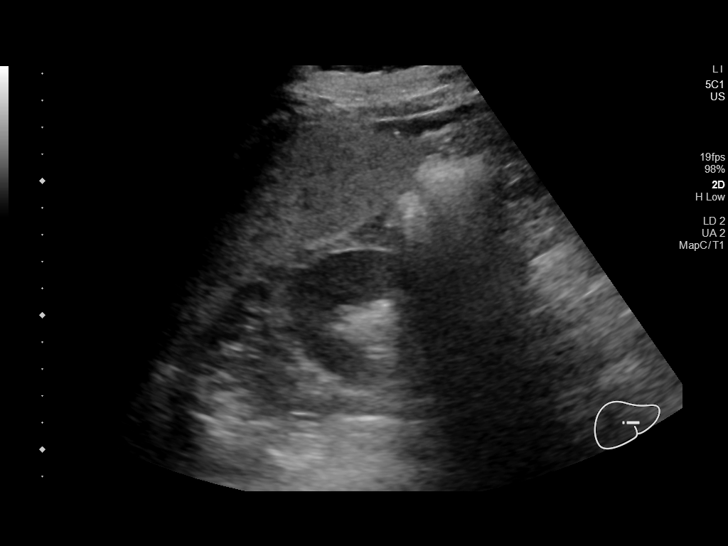
[im 37/56]
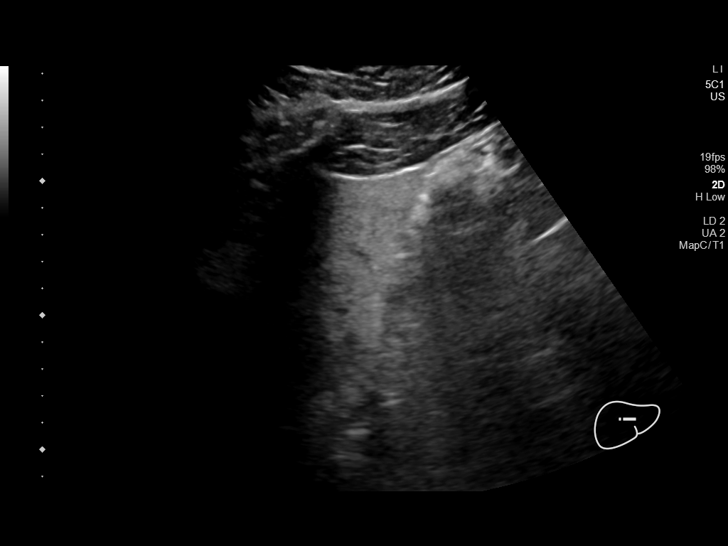
[im 42/56]
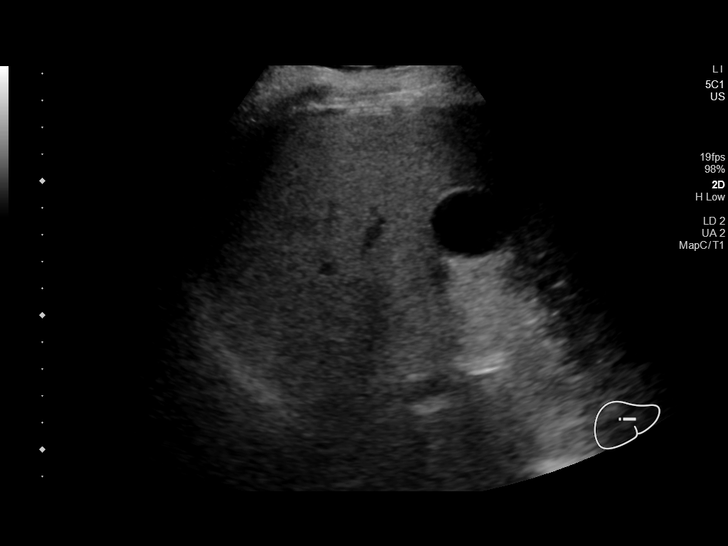
[im 46/56]
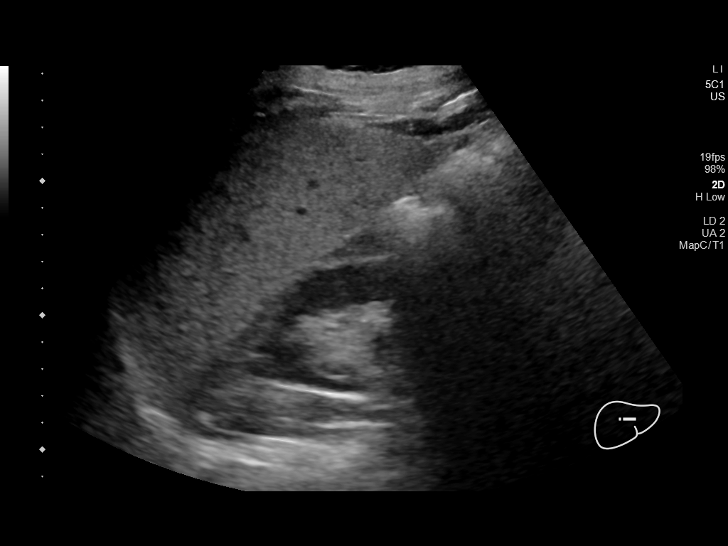
[im 51/56]
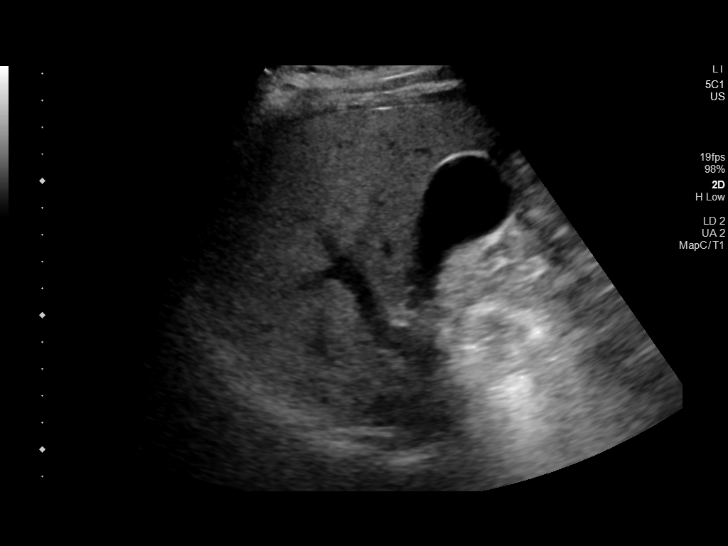
[im 56/56]
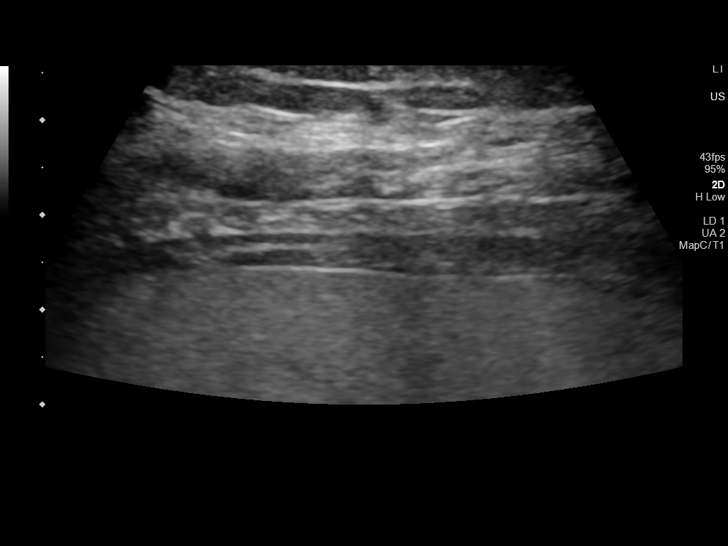

[14 of 25 positions shown; findings below may reference images not displayed]

FINDINGS: Gallbladder:

No gallstones or wall thickening visualized. No sonographic Murphy
sign noted by sonographer.

Common bile duct:

Diameter: 4 mm

Liver:

No focal lesion.

Diffusely increased parenchymal echogenicity.

Portal vein is patent on color Doppler imaging with normal direction
of blood flow towards the liver.

Other: None.
IMPRESSION: Diffuse increased echogenicity of the hepatic parenchyma is a
nonspecific indicator of hepatocellular dysfunction, most commonly
steatosis.

## 2023-04-28 ENCOUNTER — Other Ambulatory Visit: Payer: Self-pay

## 2023-06-06 ENCOUNTER — Ambulatory Visit: Payer: 59 | Admitting: Internal Medicine

## 2023-06-13 ENCOUNTER — Other Ambulatory Visit: Payer: Self-pay

## 2023-06-13 ENCOUNTER — Ambulatory Visit: Payer: 59 | Admitting: Internal Medicine

## 2023-06-13 VITALS — BP 110/80 | HR 96 | Temp 98.2°F | Ht 67.0 in | Wt 207.0 lb

## 2023-06-13 DIAGNOSIS — E785 Hyperlipidemia, unspecified: Secondary | ICD-10-CM

## 2023-06-13 DIAGNOSIS — I2583 Coronary atherosclerosis due to lipid rich plaque: Secondary | ICD-10-CM

## 2023-06-13 DIAGNOSIS — M722 Plantar fascial fibromatosis: Secondary | ICD-10-CM | POA: Diagnosis not present

## 2023-06-13 DIAGNOSIS — E1169 Type 2 diabetes mellitus with other specified complication: Secondary | ICD-10-CM

## 2023-06-13 MED ORDER — NABUMETONE 500 MG PO TABS
500.0000 mg | ORAL_TABLET | Freq: Two times a day (BID) | ORAL | 1 refills | Status: DC | PRN
  Filled 2023-06-13: qty 60, 30d supply, fill #0

## 2023-06-13 NOTE — Assessment & Plan Note (Signed)
On diet A1c is god

## 2023-06-13 NOTE — Assessment & Plan Note (Signed)
Start vascepa 

## 2023-06-13 NOTE — Assessment & Plan Note (Signed)
Statin intolerant due to recurrent pancreatitis, possibly statin induced Follow-up with cardiology

## 2023-06-13 NOTE — Progress Notes (Signed)
Subjective:  Patient ID: Dean Glass, male    DOB: 01/28/1976  Age: 47 y.o. MRN: [id number]  CC: Medical Management of Chronic Issues (6 MNTH F/U)   HPI Dean Glass presents for diabetes, coronary disease, dyslipidemia, plantar fasciitis  Outpatient Medications Prior to Visit  Medication Sig Dispense Refill   aspirin EC 81 MG tablet Take 81 mg by mouth daily. Swallow whole.     Blood Glucose Monitoring Suppl (ONETOUCH VERIO) w/Device KIT 1 Units by Does not apply route daily as needed. 1 kit 1   Continuous Blood Gluc Receiver (DEXCOM G7 RECEIVER) DEVI Use as directed to check blood sugars 1 each 0   Continuous Glucose Sensor (DEXCOM G7 SENSOR) MISC Use as directed to check blood sugars 9 each 3   glucose blood (ONETOUCH VERIO) test strip 1 each by Other route daily. 100 strip 3   icosapent Ethyl (VASCEPA) 1 g capsule Take 2 capsules (2 g total) by mouth 2 (two) times daily. 360 capsule 3   Lancets (ONETOUCH ULTRASOFT) lancets Use as instructed 100 each 5   Liniments (BLUE-EMU SUPER STRENGTH EX) Apply 1 Application topically 2 (two) times daily as needed (plantar fasciitis).     lipase/protease/amylase (CREON) 12000-38000 units CPEP capsule Take 1 capsule (12,000 Units total) by mouth 3 (three) times daily with meals. 270 capsule 3   ibuprofen (ADVIL) 200 MG tablet Take 400 mg by mouth every 6 (six) hours as needed for moderate pain.     meloxicam (MOBIC) 15 MG tablet Take 1 tablet (15 mg total) by mouth daily. 30 tablet 0   nabumetone (RELAFEN) 500 MG tablet Take 1 tablet (500 mg total) by mouth 2 (two) times daily as needed for moderate pain. 60 tablet 1   amoxicillin-clavulanate (AUGMENTIN) 875-125 MG tablet Take 1 tablet by mouth 2 (two) times daily. 14 tablet 0   No facility-administered medications prior to visit.    ROS: Review of Systems  Objective:  BP 110/80 (BP Location: Right Arm, Patient Position: Sitting, Cuff Size: Normal)   Pulse 96   Temp 98.2 F (36.8  C) (Oral)   Ht 5\' 7"  (1.702 m)   Wt 207 lb (93.9 kg)   SpO2 96%   BMI 32.42 kg/m   BP Readings from Last 3 Encounters:  06/13/23 110/80  12/02/22 118/80  09/21/22 118/78    Wt Readings from Last 3 Encounters:  06/13/23 207 lb (93.9 kg)  12/02/22 206 lb (93.4 kg)  09/21/22 204 lb (92.5 kg)    Physical Exam  Lab Results  Component Value Date   WBC 6.4 12/01/2022   HGB 14.6 12/01/2022   HCT 43.2 12/01/2022   PLT 283.0 12/01/2022   GLUCOSE 127 (H) 12/01/2022   CHOL 185 12/01/2022   TRIG 118.0 12/01/2022   HDL 40.00 12/01/2022   LDLDIRECT 124.0 10/19/2021   LDLCALC 121 (H) 12/01/2022   ALT 25 12/01/2022   AST 18 12/01/2022   NA 139 12/01/2022   K 4.7 12/01/2022   CL 104 12/01/2022   CREATININE 0.90 12/01/2022   BUN 13 12/01/2022   CO2 22 12/01/2022   TSH 1.69 12/01/2022   PSA 1.31 12/01/2022   HGBA1C 6.7 (H) 12/01/2022   MICROALBUR 0.8 12/01/2022    No results found.  Assessment & Plan:   Problem List Items Addressed This Visit     Coronary atherosclerosis   Statin intolerant due to recurrent pancreatitis, possibly statin induced Follow-up with cardiology      Dyslipidemia -  Primary   Start vascepa      Diabetes (HCC)   On diet A1c is god      Plantar fasciitis   Relafen renewed.         Meds ordered this encounter  Medications   nabumetone (RELAFEN) 500 MG tablet    Sig: Take 1 tablet (500 mg total) by mouth 2 (two) times daily as needed for moderate pain (pain score 4-6).    Dispense:  60 tablet    Refill:  1      Follow-up: Return in about 6 months (around 12/12/2023) for Wellness Exam.  Dean Primes, MD

## 2023-06-14 ENCOUNTER — Other Ambulatory Visit: Payer: Self-pay

## 2023-06-21 ENCOUNTER — Other Ambulatory Visit: Payer: Self-pay

## 2023-06-22 NOTE — Assessment & Plan Note (Signed)
Relafen renewed.

## 2023-07-11 ENCOUNTER — Other Ambulatory Visit: Payer: Self-pay | Admitting: Internal Medicine

## 2023-07-12 ENCOUNTER — Other Ambulatory Visit: Payer: Self-pay

## 2023-07-12 MED ORDER — DEXCOM G7 SENSOR MISC
3 refills | Status: AC
  Filled 2023-07-12: qty 3, 30d supply, fill #0
  Filled 2023-08-14: qty 3, 30d supply, fill #1
  Filled 2023-09-15 (×2): qty 3, 30d supply, fill #2
  Filled 2023-10-11 – 2023-11-06 (×2): qty 3, 30d supply, fill #3
  Filled 2023-12-05: qty 3, 30d supply, fill #4
  Filled 2024-01-04: qty 3, 30d supply, fill #5
  Filled 2024-03-20: qty 3, 30d supply, fill #6
  Filled 2024-04-24: qty 3, 30d supply, fill #7
  Filled 2024-05-30: qty 3, 30d supply, fill #8
  Filled 2024-07-02: qty 3, 30d supply, fill #9

## 2023-07-15 ENCOUNTER — Other Ambulatory Visit: Payer: Self-pay

## 2023-07-15 ENCOUNTER — Other Ambulatory Visit (INDEPENDENT_AMBULATORY_CARE_PROVIDER_SITE_OTHER): Payer: 59

## 2023-07-15 DIAGNOSIS — E785 Hyperlipidemia, unspecified: Secondary | ICD-10-CM | POA: Diagnosis not present

## 2023-07-15 DIAGNOSIS — E1169 Type 2 diabetes mellitus with other specified complication: Secondary | ICD-10-CM | POA: Diagnosis not present

## 2023-07-15 LAB — LIPID PANEL
Cholesterol: 221 mg/dL — ABNORMAL HIGH (ref 0–200)
HDL: 37.2 mg/dL — ABNORMAL LOW (ref >39.00)
LDL Cholesterol: 148 mg/dL — ABNORMAL HIGH (ref 0–99)
NonHDL: 183.44
Total CHOL/HDL Ratio: 6
Triglycerides: 176 mg/dL — ABNORMAL HIGH (ref 0.0–149.0)
VLDL: 35.2 mg/dL (ref 0.0–40.0)

## 2023-07-15 LAB — COMPREHENSIVE METABOLIC PANEL
ALT: 26 U/L (ref 0–53)
AST: 16 U/L (ref 0–37)
Albumin: 4.4 g/dL (ref 3.5–5.2)
Alkaline Phosphatase: 54 U/L (ref 39–117)
BUN: 14 mg/dL (ref 6–23)
CO2: 24 meq/L (ref 19–32)
Calcium: 9.2 mg/dL (ref 8.4–10.5)
Chloride: 104 meq/L (ref 96–112)
Creatinine, Ser: 0.93 mg/dL (ref 0.40–1.50)
GFR: 97.39 mL/min (ref >60.00)
Glucose, Bld: 178 mg/dL — ABNORMAL HIGH (ref 70–99)
Potassium: 3.8 meq/L (ref 3.5–5.1)
Sodium: 138 meq/L (ref 135–145)
Total Bilirubin: 0.9 mg/dL (ref 0.2–1.2)
Total Protein: 7.3 g/dL (ref 6.0–8.3)

## 2023-07-15 LAB — HEMOGLOBIN A1C: Hgb A1c MFr Bld: 7.3 % — ABNORMAL HIGH (ref 4.6–6.5)

## 2023-07-20 ENCOUNTER — Other Ambulatory Visit: Payer: Self-pay | Admitting: Internal Medicine

## 2023-07-20 ENCOUNTER — Other Ambulatory Visit: Payer: Self-pay

## 2023-07-20 ENCOUNTER — Encounter: Payer: Self-pay | Admitting: Internal Medicine

## 2023-07-20 DIAGNOSIS — E1169 Type 2 diabetes mellitus with other specified complication: Secondary | ICD-10-CM

## 2023-07-20 MED ORDER — REPAGLINIDE 1 MG PO TABS
1.0000 mg | ORAL_TABLET | Freq: Three times a day (TID) | ORAL | 11 refills | Status: DC
  Filled 2023-07-20: qty 90, 30d supply, fill #0
  Filled 2023-08-14: qty 90, 30d supply, fill #1
  Filled 2023-09-15 (×2): qty 90, 30d supply, fill #2
  Filled 2023-11-06 – 2023-12-05 (×2): qty 90, 30d supply, fill #3

## 2023-07-22 ENCOUNTER — Other Ambulatory Visit: Payer: Self-pay

## 2023-07-26 LAB — HM DIABETES EYE EXAM

## 2023-08-15 ENCOUNTER — Other Ambulatory Visit: Payer: Self-pay

## 2023-08-17 ENCOUNTER — Other Ambulatory Visit: Payer: Self-pay

## 2023-08-23 ENCOUNTER — Telehealth (INDEPENDENT_AMBULATORY_CARE_PROVIDER_SITE_OTHER): Payer: 59 | Admitting: Family Medicine

## 2023-08-23 ENCOUNTER — Ambulatory Visit: Payer: Self-pay | Admitting: Internal Medicine

## 2023-08-23 ENCOUNTER — Telehealth: Payer: Self-pay

## 2023-08-23 ENCOUNTER — Encounter: Payer: Self-pay | Admitting: Family Medicine

## 2023-08-23 DIAGNOSIS — R0981 Nasal congestion: Secondary | ICD-10-CM | POA: Insufficient documentation

## 2023-08-23 NOTE — Telephone Encounter (Signed)
 Red Word that prompted transfer to Nurse Triage: Pressure around sinuses and teeth, yellowish mucus, scratchy throat.                 Chief Complaint: Sinus pressure, teeth hurt, yellow mucus, scratchy throat. Symptoms: Above Frequency: Sunday Pertinent Negatives: Patient denies fever Disposition: [] ED /[] Urgent Care (no appt availability in office) / [x] Appointment(In office/virtual)/ []  [place]/ [] Home Care/ [] Refused Recommended Disposition /[]  Mobile Bus/ []  Follow-up with PCP Additional Notes: Pt. Agrees with VV.  Reason for Disposition  [1] SEVERE pain AND [2] not improved 2 hours after pain medicine  Answer Assessment - Initial Assessment Questions 1. LOCATION: "Where does it hurt?"      Head 2. ONSET: "When did the sinus pain start?"  (e.g., hours, days)      Sunday 3. SEVERITY: "How bad is the pain?"   (Scale 1-10; mild, moderate or severe)   - MILD (1-3): doesn't interfere with normal activities    - MODERATE (4-7): interferes with normal activities (e.g., work or school) or awakens from sleep   - SEVERE (8-10): excruciating pain and patient unable to do any normal activities        Sinus pain, teeth     5 4. RECURRENT SYMPTOM: "Have you ever had sinus problems before?" If Yes, ask: "When was the last time?" and "What happened that time?"      Yes 5. NASAL CONGESTION: "Is the nose blocked?" If Yes, ask: "Can you open it or must you breathe through your mouth?"     No 6. NASAL DISCHARGE: "Do you have discharge from your nose?" If so ask, "What color?"     Yellow 7. FEVER: "Do you have a fever?" If Yes, ask: "What is it, how was it measured, and when did it start?"      No 8. OTHER SYMPTOMS: "Do you have any other symptoms?" (e.g., sore throat, cough, earache, difficulty breathing)     Scratchy throat 9. PREGNANCY: "Is there any chance you are pregnant?" "When was your last menstrual period?"     N/a  Protocols used: Sinus Pain or  Congestion-A-AH

## 2023-08-23 NOTE — Telephone Encounter (Signed)
 Pt evaluated by Dr Birdie Sons today;

## 2023-08-23 NOTE — Progress Notes (Signed)
 Virtual Visit via video Note  I connected with Dean Glass today at  8:20 AM EST by a video enabled telemedicine application or telephone and verified that I am speaking with the correct person using two identifiers. Location patient: home Location provider: work Persons participating in the virtual visit: patient, provider  I discussed the limitations, risks, security and privacy concerns of performing an evaluation and management service by telephone and the availability of in person appointments. I also discussed with the patient that there may be a patient responsible charge related to this service. The patient expressed understanding and agreed to proceed.  Reason for visit: same day visit.   HPI: Congestion: Patient notes onset of symptoms 3 days ago.  Has sinus congestion and pressure.  Blowing yellow mucus out of his nose.  Has some rhinorrhea and postnasal drip as well scratchy throat.  Has been sneezing as well.  Some cough though not significant.  No fever.  No shortness of breath.  No taste or smell disturbances.  Notes only mild bodyaches.  Notes his daughter had COVID last week.  No flu exposure.  He has been taking some Sudafed for this.   ROS: See pertinent positives and negatives per HPI.  Past Medical History:  Diagnosis Date   Diabetes mellitus without complication (HCC)    diet controlled   GERD (gastroesophageal reflux disease)    Seasonal allergies     Past Surgical History:  Procedure Laterality Date   BIOPSY  05/31/2022   Procedure: BIOPSY;  Surgeon: Meridee Score Netty Starring., MD;  Location: [facility];  Service: Gastroenterology;;   COLONOSCOPY WITH PROPOFOL N/A 05/31/2022   Procedure: COLONOSCOPY WITH PROPOFOL;  Surgeon: Lemar Lofty., MD;  Location: [facility];  Service: Gastroenterology;  Laterality: N/A;   ESOPHAGOGASTRODUODENOSCOPY Left 05/31/2022   Procedure: ESOPHAGOGASTRODUODENOSCOPY (EGD);  Surgeon: Lemar Lofty., MD;   Location: [facility];  Service: Gastroenterology;  Laterality: Left;   EUS N/A 05/31/2022   Procedure: UPPER ENDOSCOPIC ULTRASOUND (EUS) LINEAR;  Surgeon: Lemar Lofty., MD;  Location: [facility];  Service: Gastroenterology;  Laterality: N/A;   VASECTOMY      Family History  Problem Relation Age of Onset   Diabetes Mother    Coronary artery disease Father    Heart disease Father        cad   Early death Brother 8       cystic kidney    SOCIAL HX: Former smoker   Current Outpatient Medications:    aspirin EC 81 MG tablet, Take 81 mg by mouth daily. Swallow whole., Disp: , Rfl:    Blood Glucose Monitoring Suppl (ONETOUCH VERIO) w/Device KIT, 1 Units by Does not apply route daily as needed., Disp: 1 kit, Rfl: 1   Continuous Blood Gluc Receiver (DEXCOM G7 RECEIVER) DEVI, Use as directed to check blood sugars, Disp: 1 each, Rfl: 0   Continuous Glucose Sensor (DEXCOM G7 SENSOR) MISC, Use as directed to check blood sugars, Disp: 9 each, Rfl: 3   glucose blood (ONETOUCH VERIO) test strip, 1 each by Other route daily., Disp: 100 strip, Rfl: 3   icosapent Ethyl (VASCEPA) 1 g capsule, Take 2 capsules (2 g total) by mouth 2 (two) times daily., Disp: 360 capsule, Rfl: 3   Lancets (ONETOUCH ULTRASOFT) lancets, Use as instructed, Disp: 100 each, Rfl: 5   Liniments (BLUE-EMU SUPER STRENGTH EX), Apply 1 Application topically 2 (two) times daily as needed (plantar fasciitis)., Disp: , Rfl:    lipase/protease/amylase (CREON) 12000-38000  units CPEP capsule, Take 1 capsule (12,000 Units total) by mouth 3 (three) times daily with meals., Disp: 270 capsule, Rfl: 3   nabumetone (RELAFEN) 500 MG tablet, Take 1 tablet (500 mg total) by mouth 2 (two) times daily as needed for moderate pain (pain score 4-6)., Disp: 60 tablet, Rfl: 1   repaglinide (PRANDIN) 1 MG tablet, Take 1 tablet (1 mg total) by mouth 3 (three) times daily before meals., Disp: 90 tablet, Rfl: 11  EXAM:  VITALS per patient if  applicable:  GENERAL: alert, oriented, appears well and in no acute distress  HEENT: atraumatic, conjunttiva clear, no obvious abnormalities on inspection of external nose and ears  NECK: normal movements of the head and neck  LUNGS: on inspection no signs of respiratory distress, breathing rate appears normal, no obvious gross SOB, gasping or wheezing  CV: no obvious cyanosis  MS: moves all visible extremities without noticeable abnormality  PSYCH/NEURO: pleasant and cooperative, no obvious depression or anxiety, speech and thought processing grossly intact  ASSESSMENT AND PLAN:  Discussed the following assessment and plan:  Problem List Items Addressed This Visit     Congestion of nasal sinus - Primary   Concern for COVID given exposure, though could be some other viral illness.  I have asked him to go get a home COVID test and test himself today.  He will contact us with the results.  Discussed if its positive we could treat with COVID-specific medication.  Discussed if it is negative then treatment is supportive.  Patient can trial Mucinex, Zyrtec, and/or Flonase over-the-counter to help with the symptoms.  Discussed short-term use of Sudafed if it is beneficial.  Discussed symptoms are not likely related to a bacterial sinus infection given the short duration of symptoms.  Discussed if he had symptoms for 7+ days or if he had significant worsening symptoms after day 5 then we would consider treatment with an antibiotic.       Return if symptoms worsen or fail to improve.   I discussed the assessment and treatment plan with the patient. The patient was provided an opportunity to ask questions and all were answered. The patient agreed with the plan and demonstrated an understanding of the instructions.   The patient was advised to call back or seek an in-person evaluation if the symptoms worsen or if the condition fails to improve as anticipated.  Marikay Alar, MD

## 2023-08-23 NOTE — Telephone Encounter (Signed)
 Noted. Given his exposure and symptoms I would suggest he test again tomorrow to confirm. He should call us if that test is positive.

## 2023-08-23 NOTE — Assessment & Plan Note (Addendum)
 Concern for COVID given exposure, though could be some other viral illness.  I have asked him to go get a home COVID test and test himself today.  He will contact us with the results.  Discussed if its positive we could treat with COVID-specific medication.  Discussed if it is negative then treatment is supportive.  Patient can trial Mucinex, Zyrtec, and/or Flonase over-the-counter to help with the symptoms.  Discussed short-term use of Sudafed if it is beneficial.  Discussed symptoms are not likely related to a bacterial sinus infection given the short duration of symptoms.  Discussed if he had symptoms for 7+ days or if he had significant worsening symptoms after day 5 then we would consider treatment with an antibiotic.

## 2023-08-23 NOTE — Telephone Encounter (Signed)
 Copied from CRM [id number]. Topic: General - Other >> Aug 23, 2023  2:43 PM [place] J wrote: Reason for CRM: Patient wanted to notify provider that his COVID test came back negative.

## 2023-09-15 ENCOUNTER — Other Ambulatory Visit: Payer: Self-pay

## 2023-09-16 ENCOUNTER — Other Ambulatory Visit: Payer: Self-pay

## 2023-10-10 ENCOUNTER — Ambulatory Visit: Payer: Self-pay

## 2023-10-10 NOTE — Telephone Encounter (Signed)
 Copied from CRM [id number]. Topic: Clinical - Red Word Triage >> Oct 10, 2023  2:20 PM Aisha D wrote: Red Word that prompted transfer to Nurse Triage: Swollen, Pain  Patient stated that his ring finger is swollen, red and painful. Patient stated that he thinks it might have started from a hangnail and has gotten worse. Patient wanted to schedule an appointment to come into the office.  Chief Complaint: Finger pain Symptoms: Redness, swelling Frequency: Since Friday Pertinent Negatives: Patient denies fever Disposition: [] ED /[] Urgent Care (no appt availability in office) / [x] Appointment(In office/virtual)/ []  [facility]/ [] Home Care/ [] Refused Recommended Disposition /[] [place] [facility]/ []  Follow-up with PCP Additional Notes: Patient called in to report pain, redness and swelling of his left ring finger. Symptoms are present at the tip of the finger, near the cuticle. Patient believes symptoms are due to an infected hang nail. Patient denied fever and drainage. Advised patient to be seen within 24 hours, per protocol. No availability with PCP. Scheduled with alternate provider in office tomorrow morning. Provided care advice and instructed patient to call back if symptoms worsen. Patient complied.   Reason for Disposition  Looks infected (spreading redness, pus)  (Exception: Localized redness and swelling of skin around nail.)  Answer Assessment - Initial Assessment Questions 1. ONSET: "When did the pain start?"      Friday of last week, worsened over the weekend 2. LOCATION and RADIATION: "Where is the pain located?"  (e.g., fingertip, around nail, joint, entire  finger)      Left ring finger, around cuticle  3. SEVERITY: "How bad is the pain?" "What does it keep you from doing?"   (Scale 1-10; or mild, moderate, severe)  - MILD (1-3): doesn't interfere with normal activities.   - MODERATE (4-7): interferes with normal activities or awakens from sleep.  - SEVERE (8-10):  excruciating pain, unable to hold a glass of water or bend finger even a little.     Rates pain 6-7 at this time 4. APPEARANCE: "What does the finger look like?" (e.g., redness, swelling, bruising, pallor)     States finger is turning red, mild swelling  5. WORK OR EXERCISE: "Has there been any recent work or exercise that involved this part (i.e., fingers or hand) of the body?"     N/A 6. CAUSE: "What do you think is causing the pain?"     Infected hangnail  7. AGGRAVATING FACTORS: "What makes the pain worse?" (e.g., using computer)     Bumping the finger on something  8. OTHER SYMPTOMS: "Do you have any other symptoms?" (e.g., fever, neck pain, numbness)     Denies fever, denies drainage  Protocols used: Finger Pain-A-AH

## 2023-10-11 ENCOUNTER — Other Ambulatory Visit: Payer: Self-pay

## 2023-10-11 ENCOUNTER — Ambulatory Visit (INDEPENDENT_AMBULATORY_CARE_PROVIDER_SITE_OTHER): Admitting: Emergency Medicine

## 2023-10-11 ENCOUNTER — Encounter: Payer: Self-pay | Admitting: Emergency Medicine

## 2023-10-11 VITALS — BP 114/80 | HR 92 | Ht 67.0 in | Wt 208.2 lb

## 2023-10-11 DIAGNOSIS — L03012 Cellulitis of left finger: Secondary | ICD-10-CM | POA: Diagnosis not present

## 2023-10-11 MED ORDER — CEFADROXIL 500 MG PO CAPS
500.0000 mg | ORAL_CAPSULE | Freq: Two times a day (BID) | ORAL | 0 refills | Status: AC
  Filled 2023-10-11: qty 14, 7d supply, fill #0

## 2023-10-11 NOTE — Patient Instructions (Signed)
 Paronychia Paronychia is an infection of the skin. It happens near a fingernail or toenail. It may cause pain and swelling around the nail. In some cases, a fluid-filled bump (abscess) can form near or under the nail. Often, this condition is not serious, and it clears up with treatment. What are the causes? This condition may be caused by a germ. The germ may be bacteria or a fungus. These germs can enter the body through an opening in the skin, such as a cut or a hangnail. Other causes include: Repeated injuries to your fingernails or toenails. Irritation of the base and sides of the nail (cuticle). What increases the risk? This condition is more likely to develop in people who: Get their hands wet often, such as a dishwasher. Bite their fingernails or the base and sides of their nails. Have other skin problems. Have hangnails or hurt fingertips. Come into contact with chemicals like detergents. Have diabetes. What are the signs or symptoms? Redness and swelling of the skin near the nail. A tender feeling around the nail. Pus-filled bumps under the skin at the base and sides of the nail. Fluid or pus under the nail. Pain in the area. How is this treated? Treatment depends on the cause of your condition and how bad it is. If your condition is mild, it may clear up on its own in a few days or after soaking in warm water. If needed, treatment may include: Antibiotic medicine. Antifungal medicine. A procedure to drain pus from a fluid-filled bump. Medicine to treat irritation and swelling (corticosteroids). Taking off part of an ingrown toenail. A bandage (dressing) may be placed over the nail area. Follow these instructions at home: Wound care Keep the affected area clean. Soak the fingers or toes in warm water as told by your doctor. You may be told to do this for 20 minutes, 2-3 times a day. Keep the area dry when you are not soaking it. Do not try to drain a fluid-filled bump on  your own. Follow instructions from your doctor about how to take care of the affected area. Make sure you: Wash your hands with soap and water for at least 20 seconds before and after you change your bandage. If you cannot use soap and water, use hand sanitizer. Change your bandage as told by your doctor. If you had a fluid-filled bump and your doctor drained it, check the area every day for signs of infection. Check for: Redness, swelling, or pain. Fluid or blood. Warmth. Pus or a bad smell. Medicines  Take over-the-counter and prescription medicines only as told by your doctor. If you were prescribed an antibiotic medicine, take it as told by your doctor. Do not stop taking it even if you start to feel better. General instructions Avoid contact with anything that irritates your skin or that you are allergic to. Do not pick at the affected area. Keep all follow-up visits. Prevention To prevent this condition from happening again: Wear rubber gloves when putting your hands in water for washing dishes or other tasks. Wear gloves if your hands might touch cleaners or chemicals. Avoid injuring your nails or fingertips. Do not bite your nails or tear hangnails. Do not cut your nails very short. Do not cut the skin at the base and sides of the nail. Use clean nail clippers or scissors when trimming nails. Contact a doctor if: You feel worse. You do not get better. You keep having or you have more fluid, blood, or  pus coming from the affected area. Your affected finger, toe, or joint gets swollen or hard to move. You have a fever or chills. There is redness spreading from the affected area. Summary Paronychia is an infection of the skin. It happens near a fingernail or toenail. This condition may cause pain and swelling around the nail. Soak the fingers or toes in warm water as told by your doctor. Often, this condition is not serious, and it clears up with treatment. This information  is not intended to replace advice given to you by your health care provider. Make sure you discuss any questions you have with your health care provider. Document Revised: 09/15/2020 Document Reviewed: 09/15/2020 Elsevier Patient Education  2024 [employer].

## 2023-10-11 NOTE — Progress Notes (Signed)
 Dean Glass 48 y.o.   Chief Complaint  Patient presents with   Hand Pain    Finger pain  Ring finger on L hand  Pt believes it may be from a hang nail he pulled off     HISTORY OF PRESENT ILLNESS: Acute problem visit today. This is a 48 y.o. male complaining of infection to the distal left ring finger that started about 5 days ago  Hand Pain  Pertinent negatives include no chest pain.     Prior to Admission medications   Medication Sig Start Date End Date Taking? Authorizing Provider  aspirin EC 81 MG tablet Take 81 mg by mouth daily. Swallow whole.   Yes [provider]  Blood Glucose Monitoring Suppl (ONETOUCH VERIO) w/Device KIT 1 Units by Does not apply route daily as needed. 05/11/21  Yes Plotnikov, Georgina Quint, MD  Continuous Blood Gluc Receiver (DEXCOM G7 RECEIVER) DEVI Use as directed to check blood sugars 02/03/22  Yes Plotnikov, Georgina Quint, MD  Continuous Glucose Sensor (DEXCOM G7 SENSOR) MISC Use as directed to check blood sugars 07/12/23  Yes Plotnikov, Georgina Quint, MD  glucose blood (ONETOUCH VERIO) test strip 1 each by Other route daily. 12/02/21  Yes Plotnikov, Georgina Quint, MD  icosapent Ethyl (VASCEPA) 1 g capsule Take 2 capsules (2 g total) by mouth 2 (two) times daily. 12/02/22  Yes Plotnikov, Georgina Quint, MD  Lancets [facility] ULTRASOFT) lancets Use as instructed 05/11/21  Yes Plotnikov, Georgina Quint, MD  Liniments (BLUE-EMU SUPER STRENGTH EX) Apply 1 Application topically 2 (two) times daily as needed (plantar fasciitis).   Yes [provider]  lipase/protease/amylase (CREON) 12000-38000 units CPEP capsule Take 1 capsule (12,000 Units total) by mouth 3 (three) times daily with meals. 05/03/22  Yes Plotnikov, Georgina Quint, MD  nabumetone (RELAFEN) 500 MG tablet Take 1 tablet (500 mg total) by mouth 2 (two) times daily as needed for moderate pain (pain score 4-6). 06/13/23  Yes Plotnikov, Georgina Quint, MD  repaglinide (PRANDIN) 1 MG tablet Take 1 tablet (1 mg total)  by mouth 3 (three) times daily before meals. 07/20/23  Yes Plotnikov, Georgina Quint, MD  fluticasone (FLONASE) 50 MCG/ACT nasal spray Place 2 sprays into both nostrils daily. 05/08/20 09/15/20  McVey, Madelaine Bhat, PA-C    Allergies  Allergen Reactions   Atorvastatin     Possible Pancreatitis   Metformin And Related     ?pancreatitis    Patient Active Problem List   Diagnosis Date Noted   Congestion of nasal sinus 08/23/2023   Exocrine pancreatic insufficiency 07/02/2022   Gallbladder sludge 07/02/2022   IPMN (intraductal papillary mucinous neoplasm) 07/02/2022   Plantar fasciitis 05/03/2022   Leukocytosis 01/25/2022   Hyponatremia 01/25/2022   Obesity (BMI 30-39.9) 01/25/2022   Testicular nodule 08/07/2021   Acute pancreatitis 05/11/2021   Diabetes (HCC) 05/11/2021   Dyslipidemia 09/22/2019   Coronary atherosclerosis 09/21/2019   Well adult exam 05/01/2015   NEOPLASM OF UNCERTAIN BEHAVIOR OF SKIN 03/31/2010    Past Medical History:  Diagnosis Date   Diabetes mellitus without complication (HCC)    diet controlled   GERD (gastroesophageal reflux disease)    Seasonal allergies     Past Surgical History:  Procedure Laterality Date   BIOPSY  05/31/2022   Procedure: BIOPSY;  Surgeon: Lemar Lofty., MD;  Location: [facility];  Service: Gastroenterology;;   COLONOSCOPY WITH PROPOFOL N/A 05/31/2022   Procedure: COLONOSCOPY WITH PROPOFOL;  Surgeon: Lemar Lofty., MD;  Location: [facility];  Service: Gastroenterology;  Laterality: N/A;   ESOPHAGOGASTRODUODENOSCOPY Left 05/31/2022   Procedure: ESOPHAGOGASTRODUODENOSCOPY (EGD);  Surgeon: Normie Becton., MD;  Location: [facility];  Service: Gastroenterology;  Laterality: Left;   EUS N/A 05/31/2022   Procedure: UPPER ENDOSCOPIC ULTRASOUND (EUS) LINEAR;  Surgeon: Normie Becton., MD;  Location: [facility];  Service: Gastroenterology;  Laterality: N/A;   VASECTOMY      Social History    Socioeconomic History   Marital status: Married    Spouse name: Not on file   Number of children: Not on file   Years of education: Not on file   Highest education level: Some college, no degree  Occupational History   Not on file  Tobacco Use   Smoking status: Former    Current packs/day: 0.00    Types: Cigarettes    Quit date: 03/13/2015    Years since quitting: 8.5   Smokeless tobacco: Former  Building services engineer status: Never Used  Substance and Sexual Activity   Alcohol use: Yes    Alcohol/week: 0.0 standard drinks of alcohol    Comment: Social   Drug use: No   Sexual activity: Yes  Other Topics Concern   Not on file  Social History Narrative   Not on file   Social Drivers of Health   Financial Resource Strain: Low Risk  (06/13/2023)   Overall Financial Resource Strain (CARDIA)    Difficulty of Paying Living Expenses: Not hard at all  Food Insecurity: No Food Insecurity (06/13/2023)   Hunger Vital Sign    Worried About Running Out of Food in the Last Year: Never true    Ran Out of Food in the Last Year: Never true  Transportation Needs: No Transportation Needs (06/13/2023)   PRAPARE - Administrator, Civil Service (Medical): No    Lack of Transportation (Non-Medical): No  Physical Activity: Sufficiently Active (06/13/2023)   Exercise Vital Sign    Days of Exercise per Week: 5 days    Minutes of Exercise per Session: 60 min  Stress: No Stress Concern Present (06/13/2023)   [employer] of Occupational Health - Occupational Stress Questionnaire    Feeling of Stress : Not at all  Social Connections: Moderately Isolated (06/13/2023)   Social Connection and Isolation Panel [NHANES]    Frequency of Communication with Friends and Family: Twice a week    Frequency of Social Gatherings with Friends and Family: Once a week    Attends Religious Services: Never    Database administrator or Organizations: No    Attends Engineer, structural:  Not on file    Marital Status: Married  Catering manager Violence: Not on file    Family History  Problem Relation Age of Onset   Diabetes Mother    Coronary artery disease Father    Heart disease Father        cad   Early death Brother 57       cystic kidney     Review of Systems  Constitutional: Negative.  Negative for chills and fever.  HENT:  Negative for congestion and sore throat.   Respiratory: Negative.  Negative for cough and shortness of breath.   Cardiovascular: Negative.  Negative for chest pain.  Gastrointestinal:  Negative for abdominal pain, diarrhea, nausea and vomiting.  Skin: Negative.  Negative for rash.  All other systems reviewed and are negative.   Vitals:   10/11/23 0942  BP: 114/80  Pulse: 92  SpO2:  98%    Physical Exam Vitals reviewed.  Constitutional:      Appearance: Normal appearance.  HENT:     Head: Normocephalic.  Eyes:     Extraocular Movements: Extraocular movements intact.  Cardiovascular:     Rate and Rhythm: Normal rate.  Pulmonary:     Effort: Pulmonary effort is normal.  Skin:    General: Skin is warm and dry.     Comments: Paronychia left ring finger with surrounding cellulitis  Neurological:     Mental Status: He is alert and oriented to person, place, and time.  Psychiatric:        Mood and Affect: Mood normal.        Behavior: Behavior normal.   Procedure note: After obtaining verbal consent from patient, digital block with plain 1% lidocaine done and paronychia drained with disposable #15 scalpel.  Purulent material drained.  Small piece of gauze placed under cuticle and finger wrapped with sterile dressing.  Tolerated procedure well without complications.  Will start antibiotics.   ASSESSMENT & PLAN: Problem List Items Addressed This Visit       Musculoskeletal and Integument   Paronychia of finger, left - Primary   Incision and drainage done after finger block with 1% plain lidocaine Purulent material  drained.  Small piece of gauze packing applied Tolerated procedure well. Post incision care instructions given Recommend to start Duricef 500 mg twice a day for the next 5 to 7 days Advised to remove packing in 48 hours Contact the office if no better or worse during the next several days.      Relevant Medications   cefadroxil (DURICEF) 500 MG capsule   Patient Instructions  Paronychia Paronychia is an infection of the skin. It happens near a fingernail or toenail. It may cause pain and swelling around the nail. In some cases, a fluid-filled bump (abscess) can form near or under the nail. Often, this condition is not serious, and it clears up with treatment. What are the causes? This condition may be caused by a germ. The germ may be bacteria or a fungus. These germs can enter the body through an opening in the skin, such as a cut or a hangnail. Other causes include: Repeated injuries to your fingernails or toenails. Irritation of the base and sides of the nail (cuticle). What increases the risk? This condition is more likely to develop in people who: Get their hands wet often, such as a dishwasher. Bite their fingernails or the base and sides of their nails. Have other skin problems. Have hangnails or hurt fingertips. Come into contact with chemicals like detergents. Have diabetes. What are the signs or symptoms? Redness and swelling of the skin near the nail. A tender feeling around the nail. Pus-filled bumps under the skin at the base and sides of the nail. Fluid or pus under the nail. Pain in the area. How is this treated? Treatment depends on the cause of your condition and how bad it is. If your condition is mild, it may clear up on its own in a few days or after soaking in warm water. If needed, treatment may include: Antibiotic medicine. Antifungal medicine. A procedure to drain pus from a fluid-filled bump. Medicine to treat irritation and swelling  (corticosteroids). Taking off part of an ingrown toenail. A bandage (dressing) may be placed over the nail area. Follow these instructions at home: Wound care Keep the affected area clean. Soak the fingers or toes in warm water as told  by your doctor. You may be told to do this for 20 minutes, 2-3 times a day. Keep the area dry when you are not soaking it. Do not try to drain a fluid-filled bump on your own. Follow instructions from your doctor about how to take care of the affected area. Make sure you: Wash your hands with soap and water for at least 20 seconds before and after you change your bandage. If you cannot use soap and water, use hand sanitizer. Change your bandage as told by your doctor. If you had a fluid-filled bump and your doctor drained it, check the area every day for signs of infection. Check for: Redness, swelling, or pain. Fluid or blood. Warmth. Pus or a bad smell. Medicines  Take over-the-counter and prescription medicines only as told by your doctor. If you were prescribed an antibiotic medicine, take it as told by your doctor. Do not stop taking it even if you start to feel better. General instructions Avoid contact with anything that irritates your skin or that you are allergic to. Do not pick at the affected area. Keep all follow-up visits. Prevention To prevent this condition from happening again: Wear rubber gloves when putting your hands in water for washing dishes or other tasks. Wear gloves if your hands might touch cleaners or chemicals. Avoid injuring your nails or fingertips. Do not bite your nails or tear hangnails. Do not cut your nails very short. Do not cut the skin at the base and sides of the nail. Use clean nail clippers or scissors when trimming nails. Contact a doctor if: You feel worse. You do not get better. You keep having or you have more fluid, blood, or pus coming from the affected area. Your affected finger, toe, or joint gets  swollen or hard to move. You have a fever or chills. There is redness spreading from the affected area. Summary Paronychia is an infection of the skin. It happens near a fingernail or toenail. This condition may cause pain and swelling around the nail. Soak the fingers or toes in warm water as told by your doctor. Often, this condition is not serious, and it clears up with treatment. This information is not intended to replace advice given to you by your health care provider. Make sure you discuss any questions you have with your health care provider. Document Revised: 09/15/2020 Document Reviewed: 09/15/2020 Elsevier Patient Education  2024 [employer]    Maryagnes Small, MD [facility]

## 2023-10-11 NOTE — Assessment & Plan Note (Signed)
 Incision and drainage done after finger block with 1% plain lidocaine Purulent material drained.  Small piece of gauze packing applied Tolerated procedure well. Post incision care instructions given Recommend to start Duricef 500 mg twice a day for the next 5 to 7 days Advised to remove packing in 48 hours Contact the office if no better or worse during the next several days.

## 2023-10-20 ENCOUNTER — Other Ambulatory Visit: Payer: Self-pay

## 2023-11-07 ENCOUNTER — Other Ambulatory Visit: Payer: Self-pay

## 2023-11-07 ENCOUNTER — Encounter (HOSPITAL_COMMUNITY): Payer: Self-pay

## 2023-11-07 ENCOUNTER — Other Ambulatory Visit (HOSPITAL_COMMUNITY): Payer: Self-pay

## 2023-11-10 ENCOUNTER — Other Ambulatory Visit (HOSPITAL_COMMUNITY): Payer: Self-pay

## 2023-12-05 ENCOUNTER — Other Ambulatory Visit: Payer: Self-pay | Admitting: Internal Medicine

## 2023-12-05 ENCOUNTER — Other Ambulatory Visit: Payer: Self-pay

## 2023-12-05 MED ORDER — ICOSAPENT ETHYL 1 G PO CAPS
2.0000 g | ORAL_CAPSULE | Freq: Two times a day (BID) | ORAL | 3 refills | Status: DC
  Filled 2023-12-05: qty 360, 90d supply, fill #0

## 2023-12-08 ENCOUNTER — Other Ambulatory Visit (HOSPITAL_COMMUNITY): Payer: Self-pay

## 2023-12-08 ENCOUNTER — Other Ambulatory Visit: Payer: Self-pay

## 2023-12-12 ENCOUNTER — Ambulatory Visit: Payer: 59 | Admitting: Internal Medicine

## 2023-12-16 ENCOUNTER — Ambulatory Visit: Admitting: Internal Medicine

## 2023-12-16 ENCOUNTER — Encounter: Payer: Self-pay | Admitting: Internal Medicine

## 2023-12-16 VITALS — BP 110/78 | HR 73 | Temp 98.8°F | Ht 67.0 in | Wt 206.0 lb

## 2023-12-16 DIAGNOSIS — Z0000 Encounter for general adult medical examination without abnormal findings: Secondary | ICD-10-CM

## 2023-12-16 DIAGNOSIS — Z125 Encounter for screening for malignant neoplasm of prostate: Secondary | ICD-10-CM

## 2023-12-16 DIAGNOSIS — E785 Hyperlipidemia, unspecified: Secondary | ICD-10-CM

## 2023-12-16 DIAGNOSIS — Z7984 Long term (current) use of oral hypoglycemic drugs: Secondary | ICD-10-CM

## 2023-12-16 DIAGNOSIS — I2583 Coronary atherosclerosis due to lipid rich plaque: Secondary | ICD-10-CM | POA: Diagnosis not present

## 2023-12-16 DIAGNOSIS — E1169 Type 2 diabetes mellitus with other specified complication: Secondary | ICD-10-CM

## 2023-12-16 DIAGNOSIS — L570 Actinic keratosis: Secondary | ICD-10-CM

## 2023-12-16 DIAGNOSIS — Z789 Other specified health status: Secondary | ICD-10-CM

## 2023-12-16 LAB — URINALYSIS
Bilirubin Urine: NEGATIVE
Hgb urine dipstick: NEGATIVE
Ketones, ur: NEGATIVE
Leukocytes,Ua: NEGATIVE
Nitrite: NEGATIVE
Specific Gravity, Urine: >=1.03 — AB (ref 1.000–1.030)
Total Protein, Urine: NEGATIVE
Urine Glucose: NEGATIVE
Urobilinogen, UA: 0.2 (ref 0.0–1.0)
pH: 6 (ref 5.0–8.0)

## 2023-12-16 LAB — COMPREHENSIVE METABOLIC PANEL WITH GFR
ALT: 25 U/L (ref 0–53)
AST: 16 U/L (ref 0–37)
Albumin: 4.5 g/dL (ref 3.5–5.2)
Alkaline Phosphatase: 48 U/L (ref 39–117)
BUN: 12 mg/dL (ref 6–23)
CO2: 27 meq/L (ref 19–32)
Calcium: 9.1 mg/dL (ref 8.4–10.5)
Chloride: 104 meq/L (ref 96–112)
Creatinine, Ser: 0.81 mg/dL (ref 0.40–1.50)
GFR: 104.26 mL/min (ref >60.00)
Glucose, Bld: 121 mg/dL — ABNORMAL HIGH (ref 70–99)
Potassium: 4.2 meq/L (ref 3.5–5.1)
Sodium: 138 meq/L (ref 135–145)
Total Bilirubin: 0.8 mg/dL (ref 0.2–1.2)
Total Protein: 7 g/dL (ref 6.0–8.3)

## 2023-12-16 LAB — MICROALBUMIN / CREATININE URINE RATIO
Creatinine,U: 223.2 mg/dL
Microalb Creat Ratio: 5.3 mg/g (ref 0.0–30.0)
Microalb, Ur: 1.2 mg/dL (ref 0.0–1.9)

## 2023-12-16 LAB — TSH: TSH: 1.35 u[IU]/mL (ref 0.35–5.50)

## 2023-12-16 LAB — CBC WITH DIFFERENTIAL/PLATELET
Basophils Absolute: 0 10*3/uL (ref 0.0–0.1)
Basophils Relative: 0.5 % (ref 0.0–3.0)
Eosinophils Absolute: 0.1 10*3/uL (ref 0.0–0.7)
Eosinophils Relative: 1.5 % (ref 0.0–5.0)
HCT: 43 % (ref 39.0–52.0)
Hemoglobin: 14.5 g/dL (ref 13.0–17.0)
Lymphocytes Relative: 41.3 % (ref 12.0–46.0)
Lymphs Abs: 2.9 10*3/uL (ref 0.7–4.0)
MCHC: 33.8 g/dL (ref 30.0–36.0)
MCV: 88 fl (ref 78.0–100.0)
Monocytes Absolute: 0.5 10*3/uL (ref 0.1–1.0)
Monocytes Relative: 7.3 % (ref 3.0–12.0)
Neutro Abs: 3.5 10*3/uL (ref 1.4–7.7)
Neutrophils Relative %: 49.4 % (ref 43.0–77.0)
Platelets: 299 10*3/uL (ref 150.0–400.0)
RBC: 4.89 Mil/uL (ref 4.22–5.81)
RDW: 13.1 % (ref 11.5–15.5)
WBC: 7 10*3/uL (ref 4.0–10.5)

## 2023-12-16 LAB — LIPID PANEL
Cholesterol: 203 mg/dL — ABNORMAL HIGH (ref 0–200)
HDL: 39.4 mg/dL (ref >39.00)
LDL Cholesterol: 121 mg/dL — ABNORMAL HIGH (ref 0–99)
NonHDL: 163.11
Total CHOL/HDL Ratio: 5
Triglycerides: 209 mg/dL — ABNORMAL HIGH (ref 0.0–149.0)
VLDL: 41.8 mg/dL — ABNORMAL HIGH (ref 0.0–40.0)

## 2023-12-16 LAB — PSA: PSA: 1.51 ng/mL (ref 0.10–4.00)

## 2023-12-16 LAB — HEMOGLOBIN A1C: Hgb A1c MFr Bld: 7 % — ABNORMAL HIGH (ref 4.6–6.5)

## 2023-12-16 MED ORDER — REPAGLINIDE 1 MG PO TABS: 1.0000 mg | ORAL_TABLET | Freq: Three times a day (TID) | ORAL | 3 refills | Status: DC

## 2023-12-16 MED ORDER — ICOSAPENT ETHYL 1 G PO CAPS: 2.0000 g | ORAL_CAPSULE | Freq: Two times a day (BID) | ORAL | 3 refills | Status: AC

## 2023-12-16 NOTE — Progress Notes (Signed)
 Subjective:  Patient ID: Dean Glass, male    DOB: [birth year]  Age: 48 y.o. MRN: [id number]  CC: Medical Management of Chronic Issues (6 mnth f/u, pts has concerns about area near eyes to be looked at)   HPI Dean Glass presents for a f/u on diabetes, coronary disease, dyslipidemia C/o skin lesions  Outpatient Medications Prior to Visit  Medication Sig Dispense Refill   aspirin  EC 81 MG tablet Take 81 mg by mouth daily. Swallow whole.     Blood Glucose Monitoring Suppl (ONETOUCH VERIO) w/Device KIT 1 Units by Does not apply route daily as needed. 1 kit 1   Continuous Blood Gluc Receiver (DEXCOM G7 RECEIVER) DEVI Use as directed to check blood sugars 1 each 0   Continuous Glucose Sensor (DEXCOM G7 SENSOR) MISC Use as directed to check blood sugars 9 each 3   glucose blood (ONETOUCH VERIO) test strip 1 each by Other route daily. 100 strip 3   Lancets (ONETOUCH ULTRASOFT) lancets Use as instructed 100 each 5   Liniments (BLUE-EMU SUPER STRENGTH EX) Apply 1 Application topically 2 (two) times daily as needed (plantar fasciitis).     lipase/protease/amylase (CREON ) 12000-38000 units CPEP capsule Take 1 capsule (12,000 Units total) by mouth 3 (three) times daily with meals. 270 capsule 3   icosapent  Ethyl (VASCEPA ) 1 g capsule Take 2 capsules (2 g total) by mouth 2 (two) times daily. 360 capsule 3   nabumetone  (RELAFEN ) 500 MG tablet Take 1 tablet (500 mg total) by mouth 2 (two) times daily as needed for moderate pain (pain score 4-6). 60 tablet 1   repaglinide  (PRANDIN ) 1 MG tablet Take 1 tablet (1 mg total) by mouth 3 (three) times daily before meals. 90 tablet 11   No facility-administered medications prior to visit.    ROS: Review of Systems  Constitutional:  Negative for appetite change, fatigue and unexpected weight change.  HENT:  Negative for congestion, nosebleeds, sneezing, sore throat and trouble swallowing.   Eyes:  Negative for itching and visual disturbance.   Respiratory:  Negative for cough.   Cardiovascular:  Negative for chest pain, palpitations and leg swelling.  Gastrointestinal:  Negative for abdominal distention, blood in stool, diarrhea and nausea.  Genitourinary:  Negative for frequency and hematuria.  Musculoskeletal:  Negative for back pain, gait problem, joint swelling and neck pain.  Skin:  Positive for color change. Negative for rash.  Neurological:  Negative for dizziness, tremors, speech difficulty and weakness.  Psychiatric/Behavioral:  Negative for agitation, dysphoric mood and sleep disturbance. The patient is not nervous/anxious.     Objective:  BP 110/78   Pulse 73   Temp 98.8 F (37.1 C) (Oral)   Ht 5' 7 (1.702 m)   Wt 206 lb (93.4 kg)   SpO2 98%   BMI 32.26 kg/m   BP Readings from Last 3 Encounters:  02/16/24 128/70  01/04/24 110/70  12/16/23 110/78    Wt Readings from Last 3 Encounters:  02/16/24 206 lb (93.4 kg)  01/04/24 204 lb (92.5 kg)  12/16/23 206 lb (93.4 kg)    Physical Exam Constitutional:      General: He is not in acute distress.    Appearance: He is well-developed.     Comments: NAD  Eyes:     Conjunctiva/sclera: Conjunctivae normal.     Pupils: Pupils are equal, round, and reactive to light.  Neck:     Thyroid : No thyromegaly.     Vascular: No JVD.  Cardiovascular:  Rate and Rhythm: Normal rate and regular rhythm.     Heart sounds: Normal heart sounds. No murmur heard.    No friction rub. No gallop.  Pulmonary:     Effort: Pulmonary effort is normal. No respiratory distress.     Breath sounds: Normal breath sounds. No wheezing or rales.  Chest:     Chest wall: No tenderness.  Abdominal:     General: Bowel sounds are normal. There is no distension.     Palpations: Abdomen is soft. There is no mass.     Tenderness: There is no abdominal tenderness. There is no guarding or rebound.  Musculoskeletal:        General: No tenderness. Normal range of motion.     Cervical back:  Normal range of motion.  Lymphadenopathy:     Cervical: No cervical adenopathy.  Skin:    General: Skin is warm and dry.     Findings: Lesion present. No rash.  Neurological:     Mental Status: He is alert and oriented to person, place, and time.     Cranial Nerves: No cranial nerve deficit.     Motor: No abnormal muscle tone.     Coordination: Coordination normal.     Gait: Gait normal.     Deep Tendon Reflexes: Reflexes are normal and symmetric.  Psychiatric:        Behavior: Behavior normal.        Thought Content: Thought content normal.        Judgment: Judgment normal.    AKs on scalp and face   Procedure Note :     Procedure : Cryosurgery   Indication:    Actinic keratosis(es)   Risks including unsuccessful procedure , bleeding, infection, bruising, scar, a need for a repeat  procedure and others were explained to the patient in detail as well as the benefits. Informed consent was obtained verbally.    3 lesion(s)  on the face was/were treated with liquid nitrogen on a Q-tip in a usual fasion . Band-Aid was applied and antibiotic ointment was given for a later use.   Tolerated well. Complications none.   Postprocedure instructions :     Keep the wounds clean. You can wash them with liquid soap and water. Pat dry with gauze or a Kleenex tissue  Before applying antibiotic ointment and a Band-Aid.   You need to report immediately  if  any signs of infection develop.   Lab Results  Component Value Date   WBC 8.3 01/04/2024   HGB 14.5 01/04/2024   HCT 42.4 01/04/2024   PLT 259.0 01/04/2024   GLUCOSE 158 (H) 01/04/2024   CHOL 203 (H) 12/16/2023   TRIG 209.0 (H) 12/16/2023   HDL 39.40 12/16/2023   LDLDIRECT 124.0 10/19/2021   LDLCALC 121 (H) 12/16/2023   ALT 20 01/04/2024   AST 16 01/04/2024   NA 138 01/04/2024   K 3.8 01/04/2024   CL 104 01/04/2024   CREATININE 0.95 01/04/2024   BUN 13 01/04/2024   CO2 26 01/04/2024   TSH 1.35 12/16/2023   PSA 1.51  12/16/2023   HGBA1C 7.0 (H) 12/16/2023   MICROALBUR 1.2 12/16/2023    No results found.  Assessment & Plan:   Problem List Items Addressed This Visit     Actinic keratoses   See procedure- Cryo      Coronary atherosclerosis   Continue on Vascepa , baby aspirin  daily Statin intolerant      Relevant Medications   icosapent   Ethyl (VASCEPA ) 1 g capsule   Diabetes (HCC)   Doing well.  Obtain lab work with hemoglobin A1c Continue with repaglinide       Relevant Medications   repaglinide  (PRANDIN ) 1 MG tablet   Other Relevant Orders   Hemoglobin A1c (Completed)   Microalbumin / creatinine urine ratio (Completed)   Dyslipidemia   Continue on Vascepa .  Obtain lipid profile      Relevant Medications   icosapent  Ethyl (VASCEPA ) 1 g capsule   Statin intolerance   Other Visit Diagnoses       Well adult exam    -  Primary   Relevant Medications   repaglinide  (PRANDIN ) 1 MG tablet   Other Relevant Orders   TSH (Completed)   Urinalysis (Completed)   CBC with Differential/Platelet (Completed)   Lipid panel (Completed)   PSA (Completed)   Comprehensive metabolic panel with GFR (Completed)         Meds ordered this encounter  Medications   repaglinide  (PRANDIN ) 1 MG tablet    Sig: Take 1 tablet (1 mg total) by mouth 3 (three) times daily before meals.    Dispense:  270 tablet    Refill:  3   icosapent  Ethyl (VASCEPA ) 1 g capsule    Sig: Take 2 capsules (2 g total) by mouth 2 (two) times daily.    Dispense:  360 capsule    Refill:  3      Follow-up: Return in about 6 months (around 06/16/2024) for Wellness Exam.  Marolyn Noel, MD

## 2023-12-18 ENCOUNTER — Ambulatory Visit: Payer: Self-pay | Admitting: Internal Medicine

## 2024-01-04 ENCOUNTER — Ambulatory Visit (INDEPENDENT_AMBULATORY_CARE_PROVIDER_SITE_OTHER): Admitting: Internal Medicine

## 2024-01-04 ENCOUNTER — Other Ambulatory Visit (HOSPITAL_COMMUNITY): Payer: Self-pay

## 2024-01-04 ENCOUNTER — Encounter: Payer: Self-pay | Admitting: Internal Medicine

## 2024-01-04 VITALS — BP 110/70 | HR 97 | Temp 98.1°F | Ht 67.0 in | Wt 204.0 lb

## 2024-01-04 DIAGNOSIS — R1013 Epigastric pain: Secondary | ICD-10-CM

## 2024-01-04 DIAGNOSIS — R197 Diarrhea, unspecified: Secondary | ICD-10-CM

## 2024-01-04 LAB — COMPREHENSIVE METABOLIC PANEL WITH GFR
ALT: 20 U/L (ref 0–53)
AST: 16 U/L (ref 0–37)
Albumin: 4.2 g/dL (ref 3.5–5.2)
Alkaline Phosphatase: 54 U/L (ref 39–117)
BUN: 13 mg/dL (ref 6–23)
CO2: 26 meq/L (ref 19–32)
Calcium: 9 mg/dL (ref 8.4–10.5)
Chloride: 104 meq/L (ref 96–112)
Creatinine, Ser: 0.95 mg/dL (ref 0.40–1.50)
GFR: 94.62 mL/min (ref >60.00)
Glucose, Bld: 158 mg/dL — ABNORMAL HIGH (ref 70–99)
Potassium: 3.8 meq/L (ref 3.5–5.1)
Sodium: 138 meq/L (ref 135–145)
Total Bilirubin: 1 mg/dL (ref 0.2–1.2)
Total Protein: 7 g/dL (ref 6.0–8.3)

## 2024-01-04 LAB — CBC WITH DIFFERENTIAL/PLATELET
Basophils Absolute: 0 K/uL (ref 0.0–0.1)
Basophils Relative: 0.5 % (ref 0.0–3.0)
Eosinophils Absolute: 0.2 K/uL (ref 0.0–0.7)
Eosinophils Relative: 2.6 % (ref 0.0–5.0)
HCT: 42.4 % (ref 39.0–52.0)
Hemoglobin: 14.5 g/dL (ref 13.0–17.0)
Lymphocytes Relative: 32.5 % (ref 12.0–46.0)
Lymphs Abs: 2.7 K/uL (ref 0.7–4.0)
MCHC: 34.2 g/dL (ref 30.0–36.0)
MCV: 88.4 fl (ref 78.0–100.0)
Monocytes Absolute: 0.6 K/uL (ref 0.1–1.0)
Monocytes Relative: 7.2 % (ref 3.0–12.0)
Neutro Abs: 4.7 K/uL (ref 1.4–7.7)
Neutrophils Relative %: 57.2 % (ref 43.0–77.0)
Platelets: 259 K/uL (ref 150.0–400.0)
RBC: 4.79 Mil/uL (ref 4.22–5.81)
RDW: 13 % (ref 11.5–15.5)
WBC: 8.3 K/uL (ref 4.0–10.5)

## 2024-01-04 LAB — LIPASE: Lipase: 11 U/L (ref 11.0–59.0)

## 2024-01-04 MED ORDER — PANTOPRAZOLE SODIUM 40 MG PO TBEC
40.0000 mg | DELAYED_RELEASE_TABLET | Freq: Every day | ORAL | 2 refills | Status: AC
  Filled 2024-01-04: qty 30, 30d supply, fill #0

## 2024-01-04 MED ORDER — DIPHENOXYLATE-ATROPINE 2.5-0.025 MG PO TABS
1.0000 | ORAL_TABLET | Freq: Four times a day (QID) | ORAL | 0 refills | Status: AC | PRN
  Filled 2024-01-04: qty 60, 15d supply, fill #0

## 2024-01-04 NOTE — Assessment & Plan Note (Addendum)
 New.  Unclear etiology.  Rule out pancreatitis Simple diet Obtain lab work including lipase Lomotil  for diarrhea.  Avoid NSAIDs Empiric pantoprazole 

## 2024-01-04 NOTE — Progress Notes (Signed)
 Subjective:  Patient ID: Dean Glass, male    DOB: [birth year]  Age: 48 y.o. MRN: [id number]  CC: Diarrhea (Pt states he has been having diarrhea and pain across upper abdomen x3 days )   HPI Dean Glass presents for diarrhea (Pt states he has been having diarrhea and pain across upper abdomen x3 days ) Loose stools - 6 times on Mon, 5 on Tue - small C/o abd pain x 2-3 d 4/10 Nausea No vomiting  Outpatient Medications Prior to Visit  Medication Sig Dispense Refill   aspirin  EC 81 MG tablet Take 81 mg by mouth daily. Swallow whole.     Blood Glucose Monitoring Suppl (ONETOUCH VERIO) w/Device KIT 1 Units by Does not apply route daily as needed. 1 kit 1   Continuous Blood Gluc Receiver (DEXCOM G7 RECEIVER) DEVI Use as directed to check blood sugars 1 each 0   Continuous Glucose Sensor (DEXCOM G7 SENSOR) MISC Use as directed to check blood sugars 9 each 3   glucose blood (ONETOUCH VERIO) test strip 1 each by Other route daily. 100 strip 3   icosapent  Ethyl (VASCEPA ) 1 g capsule Take 2 capsules (2 g total) by mouth 2 (two) times daily. 360 capsule 3   Lancets (ONETOUCH ULTRASOFT) lancets Use as instructed 100 each 5   Liniments (BLUE-EMU SUPER STRENGTH EX) Apply 1 Application topically 2 (two) times daily as needed (plantar fasciitis).     lipase/protease/amylase (CREON ) 12000-38000 units CPEP capsule Take 1 capsule (12,000 Units total) by mouth 3 (three) times daily with meals. 270 capsule 3   repaglinide  (PRANDIN ) 1 MG tablet Take 1 tablet (1 mg total) by mouth 3 (three) times daily before meals. 270 tablet 3   nabumetone  (RELAFEN ) 500 MG tablet Take 1 tablet (500 mg total) by mouth 2 (two) times daily as needed for moderate pain (pain score 4-6). 60 tablet 1   No facility-administered medications prior to visit.    ROS: Review of Systems  Constitutional:  Negative for appetite change, fatigue, fever and unexpected weight change.  HENT:  Negative for congestion,  nosebleeds, sneezing, sore throat and trouble swallowing.   Eyes:  Negative for itching and visual disturbance.  Respiratory:  Negative for cough.   Cardiovascular:  Negative for chest pain, palpitations and leg swelling.  Gastrointestinal:  Positive for abdominal pain, diarrhea and nausea. Negative for abdominal distention, blood in stool and vomiting.  Genitourinary:  Negative for frequency and hematuria.  Musculoskeletal:  Negative for back pain, gait problem, joint swelling and neck pain.  Skin:  Negative for rash.  Neurological:  Negative for dizziness, tremors, speech difficulty and weakness.  Psychiatric/Behavioral:  Negative for agitation, dysphoric mood, sleep disturbance and suicidal ideas. The patient is not nervous/anxious.     Objective:  BP 110/70   Pulse 97   Temp 98.1 F (36.7 C) (Oral)   Ht 5' 7 (1.702 m)   Wt 204 lb (92.5 kg)   SpO2 97%   BMI 31.95 kg/m   BP Readings from Last 3 Encounters:  01/04/24 110/70  12/16/23 110/78  10/11/23 114/80    Wt Readings from Last 3 Encounters:  01/04/24 204 lb (92.5 kg)  12/16/23 206 lb (93.4 kg)  10/11/23 208 lb 3.2 oz (94.4 kg)    Physical Exam Constitutional:      General: He is not in acute distress.    Appearance: He is well-developed. He is not ill-appearing or toxic-appearing.     Comments: NAD  Eyes:  Conjunctiva/sclera: Conjunctivae normal.     Pupils: Pupils are equal, round, and reactive to light.  Neck:     Thyroid : No thyromegaly.     Vascular: No JVD.  Cardiovascular:     Rate and Rhythm: Normal rate and regular rhythm.     Heart sounds: Normal heart sounds. No murmur heard.    No friction rub. No gallop.  Pulmonary:     Effort: Pulmonary effort is normal. No respiratory distress.     Breath sounds: Normal breath sounds. No wheezing or rales.  Chest:     Chest wall: No tenderness.  Abdominal:     General: Bowel sounds are normal. There is no distension.     Palpations: Abdomen is soft.  There is no mass.     Tenderness: There is no abdominal tenderness. There is no guarding or rebound.  Musculoskeletal:        General: No tenderness. Normal range of motion.     Cervical back: Normal range of motion.  Lymphadenopathy:     Cervical: No cervical adenopathy.  Skin:    General: Skin is warm and dry.     Findings: No rash.  Neurological:     Mental Status: He is alert and oriented to person, place, and time.     Cranial Nerves: No cranial nerve deficit.     Motor: No abnormal muscle tone.     Coordination: Coordination normal.     Gait: Gait normal.     Deep Tendon Reflexes: Reflexes are normal and symmetric.  Psychiatric:        Behavior: Behavior normal.        Thought Content: Thought content normal.        Judgment: Judgment normal.   Abd S/NT  Lab Results  Component Value Date   WBC 8.3 01/04/2024   HGB 14.5 01/04/2024   HCT 42.4 01/04/2024   PLT 259.0 01/04/2024   GLUCOSE 158 (H) 01/04/2024   CHOL 203 (H) 12/16/2023   TRIG 209.0 (H) 12/16/2023   HDL 39.40 12/16/2023   LDLDIRECT 124.0 10/19/2021   LDLCALC 121 (H) 12/16/2023   ALT 20 01/04/2024   AST 16 01/04/2024   NA 138 01/04/2024   K 3.8 01/04/2024   CL 104 01/04/2024   CREATININE 0.95 01/04/2024   BUN 13 01/04/2024   CO2 26 01/04/2024   TSH 1.35 12/16/2023   PSA 1.51 12/16/2023   HGBA1C 7.0 (H) 12/16/2023   MICROALBUR 1.2 12/16/2023    No results found.  Assessment & Plan:   Problem List Items Addressed This Visit     Diarrhea   Lomotil       Other Visit Diagnoses       Epigastric pain    -  Primary   Relevant Orders   Comprehensive metabolic panel with GFR (Completed)   CBC with Differential/Platelet (Completed)   Lipase (Completed)         Meds ordered this encounter  Medications   pantoprazole  (PROTONIX ) 40 MG tablet    Sig: Take 1 tablet (40 mg total) by mouth daily.    Dispense:  60 tablet    Refill:  2   diphenoxylate -atropine  (LOMOTIL ) 2.5-0.025 MG tablet     Sig: Take 1 tablet by mouth 4 (four) times daily as needed for diarrhea or loose stools.    Dispense:  60 tablet    Refill:  0      Follow-up: No follow-ups on file.  Marolyn Noel, MD

## 2024-01-05 ENCOUNTER — Ambulatory Visit: Payer: Self-pay | Admitting: Internal Medicine

## 2024-01-08 ENCOUNTER — Encounter: Payer: Self-pay | Admitting: Internal Medicine

## 2024-01-08 DIAGNOSIS — R1013 Epigastric pain: Secondary | ICD-10-CM | POA: Insufficient documentation

## 2024-01-08 NOTE — Assessment & Plan Note (Signed)
 New.  Unclear etiology.  Rule out pancreatitis Simple diet Obtain lab work including lipase Lomotil  for diarrhea.  Avoid NSAIDs Empiric pantoprazole 

## 2024-02-16 ENCOUNTER — Ambulatory Visit (INDEPENDENT_AMBULATORY_CARE_PROVIDER_SITE_OTHER): Admitting: Internal Medicine

## 2024-02-16 ENCOUNTER — Encounter: Payer: Self-pay | Admitting: Internal Medicine

## 2024-02-16 ENCOUNTER — Other Ambulatory Visit (HOSPITAL_COMMUNITY): Payer: Self-pay

## 2024-02-16 VITALS — BP 128/70 | HR 90 | Temp 98.2°F | Ht 67.0 in | Wt 206.0 lb

## 2024-02-16 DIAGNOSIS — U071 COVID-19: Secondary | ICD-10-CM

## 2024-02-16 DIAGNOSIS — J209 Acute bronchitis, unspecified: Secondary | ICD-10-CM | POA: Diagnosis not present

## 2024-02-16 DIAGNOSIS — K8681 Exocrine pancreatic insufficiency: Secondary | ICD-10-CM | POA: Diagnosis not present

## 2024-02-16 DIAGNOSIS — E1169 Type 2 diabetes mellitus with other specified complication: Secondary | ICD-10-CM

## 2024-02-16 DIAGNOSIS — K219 Gastro-esophageal reflux disease without esophagitis: Secondary | ICD-10-CM

## 2024-02-16 LAB — POC COVID19 BINAXNOW: SARS Coronavirus 2 Ag: POSITIVE — AB

## 2024-02-16 LAB — POCT INFLUENZA A/B
Influenza A, POC: NEGATIVE
Influenza B, POC: NEGATIVE

## 2024-02-16 MED ORDER — CEFDINIR 300 MG PO CAPS
300.0000 mg | ORAL_CAPSULE | Freq: Two times a day (BID) | ORAL | 0 refills | Status: DC
  Filled 2024-02-16: qty 20, 10d supply, fill #0

## 2024-02-16 MED ORDER — PSEUDOEPHEDRINE HCL ER 120 MG PO TB12
120.0000 mg | ORAL_TABLET | Freq: Two times a day (BID) | ORAL | 2 refills | Status: AC | PRN
  Filled 2024-02-16: qty 60, 30d supply, fill #0

## 2024-02-16 NOTE — Assessment & Plan Note (Signed)
 Sinusitis/bronchitis complicating COVID 19 Rx: Omnicef , sudafed

## 2024-02-16 NOTE — Assessment & Plan Note (Signed)
 Will not use antivirals today

## 2024-02-16 NOTE — Assessment & Plan Note (Signed)
On diet A1c is god

## 2024-02-16 NOTE — Patient Instructions (Signed)
 For a mild COVID-19 case - take zinc 50 mg a day for 1 week, vitamin C 1000 mg daily for 1 week, vitamin D2 50,000 units weekly for 2 months (unless  taking vitamin D daily already), an antioxidant Quercetin 500 mg twice a day for 1 week (if you can get it quick enough). Take Allegra or Benadryl.  Maintain good oral hydration and take Tylenol for high fever.  Call if problems. Isolate for 5 days, then wear a mask for 5 days per CDC.

## 2024-02-16 NOTE — Assessment & Plan Note (Signed)
 Continue on Protonix

## 2024-02-16 NOTE — Progress Notes (Signed)
 Subjective:  Patient ID: Dean Glass, male    DOB: [birth year]  Age: 48 y.o. MRN: [id number]  CC: Cough (Patient here for cough, congestion, fatigue, and teeth pressure. Patient states started Sunday evening. States he was taking mucinex cold and flu and does feel a little better. Still have congestion and sneezing. He does have yellowish green mucus )   HPI Francis DELENA Pae presents for cough, congestion, fatigue, and teeth pressure. Patient states started Sunday evening. States he was taking mucinex cold and flu and does feel a little better. Still have congestion and sneezing. He does have yellowish green mucus  F/u on DM - CBGs are ok, GERD   Outpatient Medications Prior to Visit  Medication Sig Dispense Refill   aspirin  EC 81 MG tablet Take 81 mg by mouth daily. Swallow whole.     Blood Glucose Monitoring Suppl (ONETOUCH VERIO) w/Device KIT 1 Units by Does not apply route daily as needed. 1 kit 1   Continuous Blood Gluc Receiver (DEXCOM G7 RECEIVER) DEVI Use as directed to check blood sugars 1 each 0   Continuous Glucose Sensor (DEXCOM G7 SENSOR) MISC Use as directed to check blood sugars 9 each 3   diphenoxylate -atropine  (LOMOTIL ) 2.5-0.025 MG tablet Take 1 tablet by mouth 4 (four) times daily as needed for diarrhea or loose stools. 60 tablet 0   glucose blood (ONETOUCH VERIO) test strip 1 each by Other route daily. 100 strip 3   icosapent  Ethyl (VASCEPA ) 1 g capsule Take 2 capsules (2 g total) by mouth 2 (two) times daily. 360 capsule 3   Lancets (ONETOUCH ULTRASOFT) lancets Use as instructed 100 each 5   Liniments (BLUE-EMU SUPER STRENGTH EX) Apply 1 Application topically 2 (two) times daily as needed (plantar fasciitis).     lipase/protease/amylase (CREON ) 12000-38000 units CPEP capsule Take 1 capsule (12,000 Units total) by mouth 3 (three) times daily with meals. 270 capsule 3   pantoprazole  (PROTONIX ) 40 MG tablet Take 1 tablet (40 mg total) by mouth daily. 60 tablet 2    repaglinide  (PRANDIN ) 1 MG tablet Take 1 tablet (1 mg total) by mouth 3 (three) times daily before meals. 270 tablet 3   No facility-administered medications prior to visit.    ROS: Review of Systems  Constitutional:  Negative for appetite change, fatigue and unexpected weight change.  HENT:  Positive for postnasal drip, rhinorrhea, sinus pressure and sore throat. Negative for congestion, nosebleeds, sneezing and trouble swallowing.   Eyes:  Negative for itching and visual disturbance.  Respiratory:  Positive for cough. Negative for wheezing.   Cardiovascular:  Negative for chest pain, palpitations and leg swelling.  Gastrointestinal:  Positive for diarrhea. Negative for abdominal distention, blood in stool, nausea and vomiting.  Genitourinary:  Negative for frequency and hematuria.  Musculoskeletal:  Negative for arthralgias, back pain, gait problem, joint swelling and neck pain.  Skin:  Negative for rash.  Neurological:  Negative for dizziness, tremors, speech difficulty and weakness.  Psychiatric/Behavioral:  Negative for agitation, dysphoric mood and sleep disturbance. The patient is not nervous/anxious.     Objective:  BP 128/70 (BP Location: Left Arm, Patient Position: Sitting, Cuff Size: Normal)   Pulse 90   Temp 98.2 F (36.8 C) (Oral)   Ht 5' 7 (1.702 m)   Wt 206 lb (93.4 kg)   SpO2 98%   BMI 32.26 kg/m   BP Readings from Last 3 Encounters:  02/16/24 128/70  01/04/24 110/70  12/16/23 110/78    Wt  Readings from Last 3 Encounters:  02/16/24 206 lb (93.4 kg)  01/04/24 204 lb (92.5 kg)  12/16/23 206 lb (93.4 kg)    Physical Exam Constitutional:      General: He is not in acute distress.    Appearance: Normal appearance. He is well-developed. He is not ill-appearing.     Comments: NAD  HENT:     Nose: Congestion and rhinorrhea present.     Mouth/Throat:     Pharynx: Posterior oropharyngeal erythema present. No oropharyngeal exudate.  Eyes:      Conjunctiva/sclera: Conjunctivae normal.     Pupils: Pupils are equal, round, and reactive to light.  Neck:     Thyroid : No thyromegaly.     Vascular: No JVD.  Cardiovascular:     Rate and Rhythm: Normal rate and regular rhythm.     Heart sounds: Normal heart sounds. No murmur heard.    No friction rub. No gallop.  Pulmonary:     Effort: Pulmonary effort is normal. No respiratory distress.     Breath sounds: Normal breath sounds. No wheezing or rales.  Chest:     Chest wall: No tenderness.  Abdominal:     General: Bowel sounds are normal. There is no distension.     Palpations: Abdomen is soft. There is no mass.     Tenderness: There is no abdominal tenderness. There is no guarding or rebound.  Musculoskeletal:        General: No tenderness. Normal range of motion.     Cervical back: Normal range of motion.     Right lower leg: No edema.     Left lower leg: No edema.  Lymphadenopathy:     Cervical: No cervical adenopathy.  Skin:    General: Skin is warm and dry.     Findings: No rash.  Neurological:     Mental Status: He is alert and oriented to person, place, and time.     Cranial Nerves: No cranial nerve deficit.     Motor: No abnormal muscle tone.     Coordination: Coordination normal.     Gait: Gait normal.     Deep Tendon Reflexes: Reflexes are normal and symmetric. Reflexes normal.  Psychiatric:        Behavior: Behavior normal.        Thought Content: Thought content normal.        Judgment: Judgment normal.     Lab Results  Component Value Date   WBC 8.3 01/04/2024   HGB 14.5 01/04/2024   HCT 42.4 01/04/2024   PLT 259.0 01/04/2024   GLUCOSE 158 (H) 01/04/2024   CHOL 203 (H) 12/16/2023   TRIG 209.0 (H) 12/16/2023   HDL 39.40 12/16/2023   LDLDIRECT 124.0 10/19/2021   LDLCALC 121 (H) 12/16/2023   ALT 20 01/04/2024   AST 16 01/04/2024   NA 138 01/04/2024   K 3.8 01/04/2024   CL 104 01/04/2024   CREATININE 0.95 01/04/2024   BUN 13 01/04/2024   CO2 26  01/04/2024   TSH 1.35 12/16/2023   PSA 1.51 12/16/2023   HGBA1C 7.0 (H) 12/16/2023   MICROALBUR 1.2 12/16/2023    No results found.  Assessment & Plan:   Problem List Items Addressed This Visit     Acute bronchitis - Primary   Sinusitis/bronchitis complicating COVID 19 Rx: Omnicef , sudafed      Relevant Orders   POC COVID-19 (Completed)   POCT Influenza A/B (Completed)   COVID-19   New. Feeling better -  see instructions OTC  meds, Vit C      Relevant Medications   cefdinir  (OMNICEF ) 300 MG capsule   Diabetes (HCC)   On diet A1c is god      Exocrine pancreatic insufficiency   Will not use antivirals today      GERD (gastroesophageal reflux disease)   Continue on Protonix          Meds ordered this encounter  Medications   pseudoephedrine  (SUDAFED) 120 MG 12 hr tablet    Sig: Take 1 tablet (120 mg total) by mouth 2 (two) times daily as needed for congestion.    Dispense:  60 tablet    Refill:  2   cefdinir  (OMNICEF ) 300 MG capsule    Sig: Take 1 capsule (300 mg total) by mouth 2 (two) times daily.    Dispense:  20 capsule    Refill:  0      Follow-up: Return for a follow-up visit.  Marolyn Noel, MD

## 2024-02-16 NOTE — Assessment & Plan Note (Addendum)
 New. Feeling better - see instructions OTC  meds, Vit C

## 2024-04-24 ENCOUNTER — Encounter: Payer: Self-pay | Admitting: Internal Medicine

## 2024-04-24 DIAGNOSIS — Z789 Other specified health status: Secondary | ICD-10-CM | POA: Insufficient documentation

## 2024-04-24 NOTE — Assessment & Plan Note (Signed)
 Continue on Vascepa , baby aspirin  daily Statin intolerant

## 2024-04-24 NOTE — Assessment & Plan Note (Signed)
 See procedure- Cryo

## 2024-04-24 NOTE — Assessment & Plan Note (Signed)
 Doing well.  Obtain lab work with hemoglobin A1c Continue with repaglinide 

## 2024-04-24 NOTE — Assessment & Plan Note (Signed)
 Continue on Vascepa .  Obtain lipid profile

## 2024-04-30 LAB — OPHTHALMOLOGY REPORT-SCANNED

## 2024-06-18 ENCOUNTER — Ambulatory Visit: Admitting: Internal Medicine

## 2024-07-02 ENCOUNTER — Ambulatory Visit (INDEPENDENT_AMBULATORY_CARE_PROVIDER_SITE_OTHER): Admitting: Internal Medicine

## 2024-07-02 ENCOUNTER — Other Ambulatory Visit (HOSPITAL_COMMUNITY): Payer: Self-pay

## 2024-07-02 VITALS — BP 118/72 | HR 61 | Ht 67.0 in | Wt 210.2 lb

## 2024-07-02 DIAGNOSIS — I2583 Coronary atherosclerosis due to lipid rich plaque: Secondary | ICD-10-CM | POA: Diagnosis not present

## 2024-07-02 DIAGNOSIS — E785 Hyperlipidemia, unspecified: Secondary | ICD-10-CM

## 2024-07-02 DIAGNOSIS — Z789 Other specified health status: Secondary | ICD-10-CM

## 2024-07-02 DIAGNOSIS — E1169 Type 2 diabetes mellitus with other specified complication: Secondary | ICD-10-CM

## 2024-07-02 DIAGNOSIS — E1159 Type 2 diabetes mellitus with other circulatory complications: Secondary | ICD-10-CM

## 2024-07-02 DIAGNOSIS — Z0000 Encounter for general adult medical examination without abnormal findings: Secondary | ICD-10-CM

## 2024-07-02 MED ORDER — REPAGLINIDE 1 MG PO TABS
ORAL_TABLET | ORAL | 3 refills | Status: AC
  Filled 2024-07-02: qty 90, 30d supply, fill #0

## 2024-07-02 NOTE — Progress Notes (Signed)
 "  Subjective:  Patient ID: Dean Glass, male    DOB: 03-05-1976  Age: 49 y.o. MRN: [id number]  CC: Medical Management of Chronic Issues (6 Month follow up)   HPI Dean Glass presents for DM, itching. Glu - higher after dinner F/u on CAD He was last A1c 7.6%   Outpatient Medications Prior to Visit  Medication Sig Dispense Refill   aspirin  EC 81 MG tablet Take 81 mg by mouth daily. Swallow whole.     Blood Glucose Monitoring Suppl (ONETOUCH VERIO) w/Device KIT 1 Units by Does not apply route daily as needed. 1 kit 1   Continuous Blood Gluc Receiver (DEXCOM G7 RECEIVER) DEVI Use as directed to check blood sugars 1 each 0   Continuous Glucose Sensor (DEXCOM G7 SENSOR) MISC Use as directed to check blood sugars 9 each 3   diphenoxylate -atropine  (LOMOTIL ) 2.5-0.025 MG tablet Take 1 tablet by mouth 4 (four) times daily as needed for diarrhea or loose stools. 60 tablet 0   glucose blood (ONETOUCH VERIO) test strip 1 each by Other route daily. 100 strip 3   icosapent  Ethyl (VASCEPA ) 1 g capsule Take 2 capsules (2 g total) by mouth 2 (two) times daily. 360 capsule 3   Lancets (ONETOUCH ULTRASOFT) lancets Use as instructed 100 each 5   Liniments (BLUE-EMU SUPER STRENGTH EX) Apply 1 Application topically 2 (two) times daily as needed (plantar fasciitis).     lipase/protease/amylase (CREON ) 12000-38000 units CPEP capsule Take 1 capsule (12,000 Units total) by mouth 3 (three) times daily with meals. 270 capsule 3   pantoprazole  (PROTONIX ) 40 MG tablet Take 1 tablet (40 mg total) by mouth daily. 60 tablet 2   pseudoephedrine  (SUDAFED) 120 MG 12 hr tablet Take 1 tablet (120 mg total) by mouth 2 (two) times daily as needed for congestion. 60 tablet 2   cefdinir  (OMNICEF ) 300 MG capsule Take 1 capsule (300 mg total) by mouth 2 (two) times daily. 20 capsule 0   repaglinide  (PRANDIN ) 1 MG tablet Take 1 tablet (1 mg total) by mouth 3 (three) times daily before meals. 270 tablet 3   No  facility-administered medications prior to visit.    ROS: Review of Systems  Constitutional:  Negative for appetite change, fatigue and unexpected weight change.  HENT:  Negative for congestion, nosebleeds, sneezing, sore throat and trouble swallowing.   Eyes:  Negative for itching and visual disturbance.  Respiratory:  Negative for cough.   Cardiovascular:  Negative for chest pain, palpitations and leg swelling.  Gastrointestinal:  Negative for abdominal distention, blood in stool, diarrhea and nausea.  Genitourinary:  Negative for frequency and hematuria.  Musculoskeletal:  Negative for back pain, gait problem, joint swelling and neck pain.  Skin:  Negative for rash.  Neurological:  Negative for dizziness, tremors, speech difficulty and weakness.  Psychiatric/Behavioral:  Negative for agitation, dysphoric mood and sleep disturbance. The patient is not nervous/anxious.     Objective:  BP 118/72   Pulse 61   Ht 5' 7 (1.702 m)   Wt 210 lb 3.2 oz (95.3 kg)   SpO2 96%   BMI 32.92 kg/m   BP Readings from Last 3 Encounters:  07/02/24 118/72  02/16/24 128/70  01/04/24 110/70    Wt Readings from Last 3 Encounters:  07/02/24 210 lb 3.2 oz (95.3 kg)  02/16/24 206 lb (93.4 kg)  01/04/24 204 lb (92.5 kg)    Physical Exam Constitutional:      General: He is not in acute  distress.    Appearance: He is well-developed.     Comments: NAD  Eyes:     Conjunctiva/sclera: Conjunctivae normal.     Pupils: Pupils are equal, round, and reactive to light.  Neck:     Thyroid : No thyromegaly.     Vascular: No JVD.  Cardiovascular:     Rate and Rhythm: Normal rate and regular rhythm.     Heart sounds: Normal heart sounds. No murmur heard.    No friction rub. No gallop.  Pulmonary:     Effort: Pulmonary effort is normal. No respiratory distress.     Breath sounds: Normal breath sounds. No wheezing or rales.  Chest:     Chest wall: No tenderness.  Abdominal:     General: Bowel sounds  are normal. There is no distension.     Palpations: Abdomen is soft. There is no mass.     Tenderness: There is no abdominal tenderness. There is no guarding or rebound.  Musculoskeletal:        General: No tenderness. Normal range of motion.     Cervical back: Normal range of motion.  Lymphadenopathy:     Cervical: No cervical adenopathy.  Skin:    General: Skin is warm and dry.     Findings: No rash.  Neurological:     Mental Status: He is alert and oriented to person, place, and time.     Cranial Nerves: No cranial nerve deficit.     Motor: No abnormal muscle tone.     Coordination: Coordination normal.     Gait: Gait normal.     Deep Tendon Reflexes: Reflexes are normal and symmetric.  Psychiatric:        Behavior: Behavior normal.        Thought Content: Thought content normal.        Judgment: Judgment normal.     Lab Results  Component Value Date   WBC 8.3 01/04/2024   HGB 14.5 01/04/2024   HCT 42.4 01/04/2024   PLT 259.0 01/04/2024   GLUCOSE 158 (H) 01/04/2024   CHOL 203 (H) 12/16/2023   TRIG 209.0 (H) 12/16/2023   HDL 39.40 12/16/2023   LDLDIRECT 124.0 10/19/2021   LDLCALC 121 (H) 12/16/2023   ALT 20 01/04/2024   AST 16 01/04/2024   NA 138 01/04/2024   K 3.8 01/04/2024   CL 104 01/04/2024   CREATININE 0.95 01/04/2024   BUN 13 01/04/2024   CO2 26 01/04/2024   TSH 1.35 12/16/2023   PSA 1.51 12/16/2023   HGBA1C 7.0 (H) 12/16/2023   MICROALBUR 1.2 12/16/2023    No results found.  Assessment & Plan:   Problem List Items Addressed This Visit     Well adult exam   Relevant Medications   repaglinide  (PRANDIN ) 1 MG tablet   Other Relevant Orders   TSH   Urinalysis   CBC with Differential/Platelet   Lipid panel   PSA   Hepatic function panel   Comprehensive metabolic panel with GFR   Microalbumin / creatinine urine ratio   Coronary atherosclerosis   Statin intolerant.  Continue on Vascepa , baby aspirin  daily Mild coronary artery disease is present  - Coronary calcium  score of 2 in 2021      Dyslipidemia   Mild coronary artery disease is present - Coronary calcium  score of 2 in 2021      Type 2 diabetes mellitus with vascular disease (HCC) - Primary   Not well-controlled.  Increase Prandin  to 1 mg with breakfast  and 2 mg with dinner Monitor A1c equivalent on Dexcom G7 Lose weight Mild coronary artery disease is present - Coronary calcium  score of 2 in 2021      Relevant Medications   repaglinide  (PRANDIN ) 1 MG tablet   Statin intolerance   Unable to tolerate statins         Meds ordered this encounter  Medications   repaglinide  (PRANDIN ) 1 MG tablet    Sig: Take 1 tablet (1 mg total) by mouth daily with breakfast AND 2 tablets (2 mg total) every evening with dinner.    Dispense:  360 tablet    Refill:  3      Follow-up: Return in about 6 months (around 12/30/2024) for Wellness Exam.  Marolyn Noel, MD "

## 2024-07-22 NOTE — Assessment & Plan Note (Signed)
 Unable to tolerate statins

## 2024-07-22 NOTE — Assessment & Plan Note (Addendum)
 Not well-controlled.  Increase Prandin  to 1 mg with breakfast and 2 mg with dinner Monitor A1c equivalent on Dexcom G7 Lose weight Mild coronary artery disease is present - Coronary calcium  score of 2 in 2021

## 2024-07-22 NOTE — Assessment & Plan Note (Addendum)
 Statin intolerant.  Continue on Vascepa , baby aspirin  daily Mild coronary artery disease is present - Coronary calcium  score of 2 in 2021

## 2024-07-22 NOTE — Assessment & Plan Note (Signed)
 Mild coronary artery disease is present - Coronary calcium  score of 2 in 2021

## 2024-12-31 ENCOUNTER — Encounter: Admitting: Internal Medicine
